# Patient Record
Sex: Male | Born: 1975 | Race: Black or African American | Hispanic: No | Marital: Married | Smoking: Never smoker
Health system: Southern US, Community
[De-identification: ages and names within clinical notes are randomized; demographics above are authoritative.]

## PROBLEM LIST (undated history)

## (undated) DIAGNOSIS — R413 Other amnesia: Secondary | ICD-10-CM

## (undated) DIAGNOSIS — I10 Essential (primary) hypertension: Secondary | ICD-10-CM

## (undated) DIAGNOSIS — Q039 Congenital hydrocephalus, unspecified: Secondary | ICD-10-CM

## (undated) DIAGNOSIS — J45909 Unspecified asthma, uncomplicated: Secondary | ICD-10-CM

## (undated) DIAGNOSIS — F32A Depression, unspecified: Secondary | ICD-10-CM

## (undated) DIAGNOSIS — F419 Anxiety disorder, unspecified: Secondary | ICD-10-CM

## (undated) DIAGNOSIS — T7840XA Allergy, unspecified, initial encounter: Secondary | ICD-10-CM

## (undated) DIAGNOSIS — M5430 Sciatica, unspecified side: Secondary | ICD-10-CM

## (undated) DIAGNOSIS — M1990 Unspecified osteoarthritis, unspecified site: Secondary | ICD-10-CM

## (undated) HISTORY — DX: Sciatica, unspecified side: M54.30

## (undated) HISTORY — DX: Anxiety disorder, unspecified: F41.9

## (undated) HISTORY — PX: TONSILLECTOMY: SUR1361

## (undated) HISTORY — DX: Depression, unspecified: F32.A

## (undated) HISTORY — DX: Other amnesia: R41.3

## (undated) HISTORY — DX: Unspecified asthma, uncomplicated: J45.909

## (undated) HISTORY — DX: Allergy, unspecified, initial encounter: T78.40XA

## (undated) HISTORY — DX: Congenital hydrocephalus, unspecified: Q03.9

## (undated) HISTORY — DX: Unspecified osteoarthritis, unspecified site: M19.90

## (undated) HISTORY — PX: BRAIN SURGERY: SHX531

---

## 2005-04-02 ENCOUNTER — Ambulatory Visit: Payer: Self-pay | Admitting: Internal Medicine

## 2005-07-05 ENCOUNTER — Ambulatory Visit: Payer: Self-pay | Admitting: Internal Medicine

## 2005-07-12 ENCOUNTER — Ambulatory Visit: Payer: Self-pay | Admitting: Internal Medicine

## 2005-08-02 ENCOUNTER — Ambulatory Visit: Payer: Self-pay | Admitting: Internal Medicine

## 2006-01-20 ENCOUNTER — Ambulatory Visit: Payer: Self-pay | Admitting: Internal Medicine

## 2006-02-07 ENCOUNTER — Ambulatory Visit: Payer: Self-pay | Admitting: Internal Medicine

## 2006-12-09 ENCOUNTER — Ambulatory Visit: Payer: Self-pay | Admitting: Internal Medicine

## 2007-02-03 ENCOUNTER — Ambulatory Visit: Payer: Self-pay | Admitting: Internal Medicine

## 2007-03-10 ENCOUNTER — Ambulatory Visit: Payer: Self-pay | Admitting: Internal Medicine

## 2007-04-17 ENCOUNTER — Ambulatory Visit: Payer: Self-pay | Admitting: Internal Medicine

## 2007-09-07 DIAGNOSIS — R519 Headache, unspecified: Secondary | ICD-10-CM | POA: Insufficient documentation

## 2007-09-07 DIAGNOSIS — E669 Obesity, unspecified: Secondary | ICD-10-CM | POA: Insufficient documentation

## 2007-09-07 DIAGNOSIS — R51 Headache: Secondary | ICD-10-CM | POA: Insufficient documentation

## 2007-09-07 DIAGNOSIS — Z982 Presence of cerebrospinal fluid drainage device: Secondary | ICD-10-CM | POA: Insufficient documentation

## 2007-09-07 DIAGNOSIS — I10 Essential (primary) hypertension: Secondary | ICD-10-CM | POA: Insufficient documentation

## 2007-09-07 DIAGNOSIS — J309 Allergic rhinitis, unspecified: Secondary | ICD-10-CM | POA: Insufficient documentation

## 2008-05-06 ENCOUNTER — Ambulatory Visit: Payer: Self-pay | Admitting: Internal Medicine

## 2008-05-06 LAB — CONVERTED CEMR LAB
AST: 28 units/L (ref 0–37)
Albumin: 4.5 g/dL (ref 3.5–5.2)
BUN: 13 mg/dL (ref 6–23)
Calcium: 9.7 mg/dL (ref 8.4–10.5)
Chloride: 102 meq/L (ref 96–112)
Potassium: 4.4 meq/L (ref 3.5–5.3)

## 2008-10-03 ENCOUNTER — Ambulatory Visit: Payer: Self-pay | Admitting: Internal Medicine

## 2008-10-13 ENCOUNTER — Ambulatory Visit: Payer: Self-pay | Admitting: Internal Medicine

## 2008-12-27 LAB — LAB REPORT - SCANNED: A1c: 6

## 2009-04-03 DIAGNOSIS — F411 Generalized anxiety disorder: Secondary | ICD-10-CM | POA: Insufficient documentation

## 2009-06-06 ENCOUNTER — Ambulatory Visit (HOSPITAL_COMMUNITY): Admission: RE | Admit: 2009-06-06 | Discharge: 2009-06-06 | Payer: Self-pay

## 2009-11-20 ENCOUNTER — Encounter: Admission: RE | Admit: 2009-11-20 | Discharge: 2009-11-20 | Payer: Self-pay | Admitting: Internal Medicine

## 2009-12-28 LAB — LAB REPORT - SCANNED: A1c: 6

## 2010-02-05 ENCOUNTER — Encounter: Admission: RE | Admit: 2010-02-05 | Discharge: 2010-02-05 | Payer: Self-pay | Admitting: Internal Medicine

## 2010-12-31 LAB — LAB REPORT - SCANNED: A1c: 5.5

## 2012-01-01 LAB — LAB REPORT - SCANNED: A1c: 5.6

## 2012-01-07 ENCOUNTER — Ambulatory Visit (HOSPITAL_COMMUNITY)
Admission: RE | Admit: 2012-01-07 | Discharge: 2012-01-07 | Disposition: A | Discharge status: Home / Self Care | Payer: Medicare Other | Source: Ambulatory Visit | Attending: Internal Medicine | Admitting: Internal Medicine

## 2012-01-07 DIAGNOSIS — R0989 Other specified symptoms and signs involving the circulatory and respiratory systems: Secondary | ICD-10-CM

## 2012-01-07 NOTE — Progress Notes (Signed)
*  PRELIMINARY RESULTS* Vascular Ultrasound Carotid Duplex (Doppler) has been completed.  Preliminary findings: Bilaterally no significant ICA stenosis with antegrade vertebral flow.  Farrel Demark RDMS 01/07/2012, 10:23 AM

## 2012-01-14 ENCOUNTER — Other Ambulatory Visit: Payer: Self-pay | Admitting: Internal Medicine

## 2012-01-14 DIAGNOSIS — R7989 Other specified abnormal findings of blood chemistry: Secondary | ICD-10-CM

## 2012-01-16 ENCOUNTER — Ambulatory Visit
Admission: RE | Admit: 2012-01-16 | Discharge: 2012-01-16 | Disposition: A | Discharge status: Home / Self Care | Payer: Medicare Other | Source: Ambulatory Visit | Attending: Internal Medicine | Admitting: Internal Medicine

## 2012-01-16 DIAGNOSIS — R7989 Other specified abnormal findings of blood chemistry: Secondary | ICD-10-CM

## 2012-01-16 LAB — HEPATITIS PANEL, ACUTE: HM Hepatitis Screen: NEGATIVE

## 2013-01-06 LAB — LAB REPORT - SCANNED: A1c: 5.9

## 2014-01-05 ENCOUNTER — Other Ambulatory Visit: Payer: Self-pay | Admitting: Internal Medicine

## 2014-01-05 DIAGNOSIS — R51 Headache: Principal | ICD-10-CM

## 2014-01-05 DIAGNOSIS — R519 Headache, unspecified: Secondary | ICD-10-CM

## 2014-01-06 LAB — LAB REPORT - SCANNED: A1c: 5.9

## 2014-01-21 ENCOUNTER — Ambulatory Visit
Admission: RE | Admit: 2014-01-21 | Discharge: 2014-01-21 | Disposition: A | Discharge status: Home / Self Care | Payer: Medicare Other | Source: Ambulatory Visit | Attending: Internal Medicine | Admitting: Internal Medicine

## 2014-01-21 DIAGNOSIS — R51 Headache: Principal | ICD-10-CM

## 2014-01-21 DIAGNOSIS — R519 Headache, unspecified: Secondary | ICD-10-CM

## 2015-01-11 LAB — LAB REPORT - SCANNED: A1c: 6.1

## 2015-02-28 ENCOUNTER — Other Ambulatory Visit: Payer: Self-pay | Admitting: Internal Medicine

## 2015-02-28 DIAGNOSIS — M5489 Other dorsalgia: Secondary | ICD-10-CM

## 2015-03-01 ENCOUNTER — Ambulatory Visit
Admission: RE | Admit: 2015-03-01 | Discharge: 2015-03-01 | Disposition: A | Discharge status: Home / Self Care | Payer: Medicare Other | Source: Ambulatory Visit | Attending: Internal Medicine | Admitting: Internal Medicine

## 2015-03-01 DIAGNOSIS — M5489 Other dorsalgia: Secondary | ICD-10-CM

## 2015-12-11 ENCOUNTER — Ambulatory Visit
Admission: RE | Admit: 2015-12-11 | Discharge: 2015-12-11 | Disposition: A | Discharge status: Home / Self Care | Payer: Medicare Other | Source: Ambulatory Visit | Attending: Internal Medicine | Admitting: Internal Medicine

## 2015-12-11 ENCOUNTER — Other Ambulatory Visit: Payer: Self-pay | Admitting: Internal Medicine

## 2015-12-11 DIAGNOSIS — R0789 Other chest pain: Secondary | ICD-10-CM

## 2016-01-15 LAB — LAB REPORT - SCANNED: A1c: 5.8

## 2016-07-09 ENCOUNTER — Encounter: Payer: Self-pay | Admitting: *Deleted

## 2016-07-10 NOTE — Progress Notes (Signed)
This encounter was created in error - please disregard.

## 2017-01-15 LAB — LAB REPORT - SCANNED
A1c: 5.4
EGFR: 92
PSA, Total: 0.5

## 2018-02-10 LAB — LAB REPORT - SCANNED
A1c: 5.8
EGFR: 84
PSA, Total: 0.6

## 2019-02-11 LAB — LAB REPORT - SCANNED
A1c: 5.8
EGFR: 83
PSA, Total: 0.5

## 2019-04-26 ENCOUNTER — Other Ambulatory Visit: Payer: Medicare Other

## 2019-04-26 ENCOUNTER — Other Ambulatory Visit: Payer: Self-pay

## 2019-04-26 DIAGNOSIS — Z20822 Contact with and (suspected) exposure to covid-19: Secondary | ICD-10-CM

## 2019-04-29 LAB — NOVEL CORONAVIRUS, NAA: SARS-CoV-2, NAA: NOT DETECTED

## 2019-10-20 ENCOUNTER — Ambulatory Visit: Payer: Medicare Other | Attending: Internal Medicine

## 2019-10-20 ENCOUNTER — Other Ambulatory Visit: Payer: Self-pay

## 2019-10-20 DIAGNOSIS — Z20822 Contact with and (suspected) exposure to covid-19: Secondary | ICD-10-CM

## 2019-10-21 LAB — NOVEL CORONAVIRUS, NAA: SARS-CoV-2, NAA: NOT DETECTED

## 2020-02-10 ENCOUNTER — Ambulatory Visit: Payer: Medicare Other | Attending: Internal Medicine

## 2020-02-10 DIAGNOSIS — Z23 Encounter for immunization: Secondary | ICD-10-CM

## 2020-02-10 NOTE — Progress Notes (Signed)
   Covid-19 Vaccination Clinic  Name:  Randall Meadows    MRN: [id number] DOB: 11-29-1975  02/10/2020  [name] was observed post Covid-19 immunization for 15 minutes without incident. He was provided with Vaccine Information Sheet and instruction to access the V-Safe system.   [name] was instructed to call 911 with any severe reactions post vaccine: [name] Kitchen Difficulty breathing  . Swelling of face and throat  . A fast heartbeat  . A bad rash all over body  . Dizziness and weakness   Immunizations Administered    Name Date Dose VIS Date Route   Pfizer COVID-19 Vaccine 02/10/2020 10:24 AM 0.3 mL 10/08/2019 Intramuscular   Manufacturer: [employer], [employer]   Lot: [id number]   NDC: [address]-8

## 2020-03-02 LAB — LAB REPORT - SCANNED
A1c: 5.9
EGFR: 87

## 2020-03-06 ENCOUNTER — Ambulatory Visit: Payer: Medicare Other | Attending: Internal Medicine

## 2020-03-06 DIAGNOSIS — Z23 Encounter for immunization: Secondary | ICD-10-CM

## 2020-03-06 NOTE — Progress Notes (Signed)
   Covid-19 Vaccination Clinic  Name:  [name]    MRN: [id number] DOB: 06/07/1976  03/06/2020  [name] was observed post Covid-19 immunization for 15 minutes without incident. He was provided with Vaccine Information Sheet and instruction to access the V-Safe system.   [name] was instructed to call 911 with any severe reactions post vaccine: [name] Kitchen Difficulty breathing  . Swelling of face and throat  . A fast heartbeat  . A bad rash all over body  . Dizziness and weakness   Immunizations Administered    Name Date Dose VIS Date Route   Pfizer COVID-19 Vaccine 03/06/2020 10:27 AM 0.3 mL 12/22/2018 Intramuscular   Manufacturer: [employer], [employer]   Lot: [id number]   NDC: [address]-7

## 2020-09-15 ENCOUNTER — Other Ambulatory Visit: Payer: Self-pay

## 2020-09-15 ENCOUNTER — Emergency Department (HOSPITAL_COMMUNITY): Payer: Medicare Other

## 2020-09-15 ENCOUNTER — Encounter (HOSPITAL_COMMUNITY): Payer: Self-pay

## 2020-09-15 ENCOUNTER — Emergency Department (HOSPITAL_COMMUNITY)
Admission: EM | Admit: 2020-09-15 | Discharge: 2020-09-15 | Disposition: A | Discharge status: Home / Self Care | Payer: Medicare Other | Attending: Emergency Medicine | Admitting: Emergency Medicine

## 2020-09-15 DIAGNOSIS — I10 Essential (primary) hypertension: Secondary | ICD-10-CM | POA: Insufficient documentation

## 2020-09-15 DIAGNOSIS — Z79899 Other long term (current) drug therapy: Secondary | ICD-10-CM | POA: Diagnosis not present

## 2020-09-15 DIAGNOSIS — R079 Chest pain, unspecified: Secondary | ICD-10-CM | POA: Insufficient documentation

## 2020-09-15 HISTORY — DX: Essential (primary) hypertension: I10

## 2020-09-15 LAB — COMPREHENSIVE METABOLIC PANEL
ALT: 26 U/L (ref 0–44)
AST: 22 U/L (ref 15–41)
Albumin: 3.9 g/dL (ref 3.5–5.0)
Alkaline Phosphatase: 73 U/L (ref 38–126)
Anion gap: 8 (ref 5–15)
BUN: 13 mg/dL (ref 6–20)
CO2: 29 mmol/L (ref 22–32)
Calcium: 9 mg/dL (ref 8.9–10.3)
Chloride: 99 mmol/L (ref 98–111)
Creatinine, Ser: 0.97 mg/dL (ref 0.61–1.24)
GFR, Estimated: >60 mL/min (ref >60)
Glucose, Bld: 91 mg/dL (ref 70–99)
Potassium: 3.3 mmol/L — ABNORMAL LOW (ref 3.5–5.1)
Sodium: 136 mmol/L (ref 135–145)
Total Bilirubin: 0.5 mg/dL (ref 0.3–1.2)
Total Protein: 7.6 g/dL (ref 6.5–8.1)

## 2020-09-15 LAB — CBC WITH DIFFERENTIAL/PLATELET
Abs Immature Granulocytes: 0.04 10*3/uL (ref 0.00–0.07)
Basophils Absolute: 0.1 10*3/uL (ref 0.0–0.1)
Basophils Relative: 1 %
Eosinophils Absolute: 0.1 10*3/uL (ref 0.0–0.5)
Eosinophils Relative: 1 %
HCT: 45.7 % (ref 39.0–52.0)
Hemoglobin: 15 g/dL (ref 13.0–17.0)
Immature Granulocytes: 0 %
Lymphocytes Relative: 39 %
Lymphs Abs: 3.5 10*3/uL (ref 0.7–4.0)
MCH: 28.9 pg (ref 26.0–34.0)
MCHC: 32.8 g/dL (ref 30.0–36.0)
MCV: 88.1 fL (ref 80.0–100.0)
Monocytes Absolute: 1.1 10*3/uL — ABNORMAL HIGH (ref 0.1–1.0)
Monocytes Relative: 12 %
Neutro Abs: 4.2 10*3/uL (ref 1.7–7.7)
Neutrophils Relative %: 47 %
Platelets: 263 10*3/uL (ref 150–400)
RBC: 5.19 MIL/uL (ref 4.22–5.81)
RDW: 13.9 % (ref 11.5–15.5)
WBC: 9 10*3/uL (ref 4.0–10.5)
nRBC: 0 % (ref 0.0–0.2)

## 2020-09-15 LAB — TROPONIN I (HIGH SENSITIVITY)
Troponin I (High Sensitivity): 2 ng/L (ref <18)
Troponin I (High Sensitivity): 2 ng/L (ref <18)

## 2020-09-15 LAB — D-DIMER, QUANTITATIVE: D-Dimer, Quant: 0.27 ug/mL-FEU (ref 0.00–0.50)

## 2020-09-15 NOTE — ED Provider Notes (Signed)
[facility] EMERGENCY DEPARTMENT Provider Note   CSN: [id number] Arrival date & time: 09/15/20  6270     History Chief Complaint  Patient presents with  . Chest Pain    Randall Meadows is a 44 y.o. male.  Intermittent chest pain for last couple weeks.  Has been seen for the same without resolution.  Never associated eating.  Not really associated to positions.  Not associated to exertion.  No other symptoms associated with it.  Patient without any cardiac issue that he knows of.        Past Medical History:  Diagnosis Date  . Hypertension     Patient Active Problem List   Diagnosis Date Noted  . ANXIETY 04/03/2009  . OBESITY 09/07/2007  . HYPERTENSION 09/07/2007  . ALLERGIC RHINITIS 09/07/2007  . HEADACHE 09/07/2007  . 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/2007    History reviewed. No pertinent surgical history.     No family history on file.  Social History   Tobacco Use  . Smoking status: Never Smoker  . Smokeless tobacco: Never Used  Substance Use Topics  . Alcohol use: Never  . Drug use: Never    Home Medications Prior to Admission medications   Medication Sig Start Date End Date Taking? Authorizing Provider  cyclobenzaprine (FLEXERIL) 10 MG tablet Take 10 mg by mouth 2 (two) times daily as needed for muscle spasms. 07/23/20  Yes [provider]  lisinopril-hydrochlorothiazide (ZESTORETIC) 20-25 MG tablet Take 1 tablet by mouth daily. 08/14/20  Yes [provider]  LORazepam (ATIVAN) 0.5 MG tablet Take 0.5 mg by mouth at bedtime as needed for anxiety.  08/01/20  Yes [provider]  sertraline (ZOLOFT) 50 MG tablet Take 50 mg by mouth daily. 07/23/20  Yes [provider]  traMADol (ULTRAM) 50 MG tablet Take 50 mg by mouth 2 (two) times daily as needed for moderate pain.  07/23/20  Yes [provider]    Allergies    Patient has no known allergies.  Review of Systems   Review of Systems    All other systems reviewed and are negative.   Physical Exam Updated Vital Signs BP (!) 148/89 (BP Location: Left Arm)   Pulse 77   Temp 97.8 F (36.6 C) (Oral)   Resp 18   Ht 5\' 9"  (1.753 m)   Wt 124.7 kg   SpO2 99%   BMI 40.61 kg/m   Physical Exam Vitals and nursing note reviewed.  Constitutional:      Appearance: He is well-developed.  HENT:     Head: Normocephalic and atraumatic.     Mouth/Throat:     Mouth: Mucous membranes are moist.  Eyes:     Pupils: Pupils are equal, round, and reactive to light.  Cardiovascular:     Rate and Rhythm: Normal rate.  Pulmonary:     Effort: Pulmonary effort is normal. No respiratory distress.  Abdominal:     General: There is no distension.     Palpations: Abdomen is soft.  Musculoskeletal:        General: Normal range of motion.     Cervical back: Normal range of motion.  Skin:    General: Skin is warm and dry.  Neurological:     General: No focal deficit present.     Mental Status: He is alert.     ED Results / Procedures / Treatments   Labs (all labs ordered are listed, but only abnormal results are displayed) Labs  Reviewed  CBC WITH DIFFERENTIAL/PLATELET - Abnormal; Notable for the following components:      Result Value   Monocytes Absolute 1.1 (*)    All other components within normal limits  COMPREHENSIVE METABOLIC PANEL - Abnormal; Notable for the following components:   Potassium 3.3 (*)    All other components within normal limits  D-DIMER, QUANTITATIVE (NOT AT [facility])  TROPONIN I (HIGH SENSITIVITY)  TROPONIN I (HIGH SENSITIVITY)    EKG EKG Interpretation  Date/Time:  Friday September 15 2020 03:49:55 EST Ventricular Rate:  86 PR Interval:    QRS Duration: 95 QT Interval:  359 QTC Calculation: 430 R Axis:   35 Text Interpretation: Sinus rhythm Baseline wander in lead(s) V5 No old tracing to compare Confirmed by Marily Memos [phone]) on 09/15/2020 3:54:12 AM   Radiology No results  found.  Procedures Procedures (including critical care time)  Medications Ordered in ED Medications - No data to display  ED Course  I have reviewed the triage vital signs and the nursing notes.  Pertinent labs & imaging results that were available during my care of the patient were reviewed by me and considered in my medical decision making (see chart for details).    MDM Rules/Calculators/A&P                          Work-up unremarkable.  Low likelihood of ACS.  The likelihood of pulmonary embolus.  No evidence of infection, pneumothorax or other acute causes for symptoms at this time.  Final Clinical Impression(s) / ED Diagnoses Final diagnoses:  Nonspecific chest pain    Rx / DC Orders ED Discharge Orders    None       Lamis Behrmann, Barbara Cower, MD 09/17/20 [phone]

## 2020-09-15 NOTE — Discharge Instructions (Addendum)
Follow-up with cardiology in the next few days.  The contact information for the cardiology clinic here in [place] has been provided in this discharge summary for you to call and make these arrangements.  Return to the emergency department in the meantime if symptoms significantly worsen or change.

## 2020-09-15 NOTE — ED Provider Notes (Signed)
Care assumed from Dr. Clayborne Dana at shift change.  Patient awaiting second troponin which has resulted as negative.  Patient to be discharged with follow-up with primary doctor.   Geoffery Lyons, MD 09/15/20 [phone]

## 2020-09-15 NOTE — ED Triage Notes (Signed)
Pt reports chest pain and headache for 3 weeks. Pt reports pain comes and goes. Pt reports taking baby ASA and sometimes it helps. Pt denies any other symptoms.

## 2020-09-18 ENCOUNTER — Other Ambulatory Visit: Payer: Self-pay

## 2020-09-18 ENCOUNTER — Encounter: Payer: Self-pay | Admitting: Cardiology

## 2020-09-18 ENCOUNTER — Ambulatory Visit (INDEPENDENT_AMBULATORY_CARE_PROVIDER_SITE_OTHER): Payer: Medicare Other | Admitting: Cardiology

## 2020-09-18 VITALS — BP 126/70 | HR 85 | Ht 69.75 in | Wt 278.0 lb

## 2020-09-18 DIAGNOSIS — R072 Precordial pain: Secondary | ICD-10-CM | POA: Diagnosis not present

## 2020-09-18 DIAGNOSIS — I10 Essential (primary) hypertension: Secondary | ICD-10-CM | POA: Diagnosis not present

## 2020-09-18 DIAGNOSIS — Z7189 Other specified counseling: Secondary | ICD-10-CM | POA: Diagnosis not present

## 2020-09-18 DIAGNOSIS — R079 Chest pain, unspecified: Secondary | ICD-10-CM | POA: Diagnosis not present

## 2020-09-18 MED ORDER — METOPROLOL TARTRATE 50 MG PO TABS: ORAL_TABLET | ORAL | 0 refills | Status: DC

## 2020-09-18 NOTE — Progress Notes (Signed)
Cardiology Office Note:    Date:  09/18/2020   ID:  Randall Meadows, DOB 08/13/1976, MRN [id number]  PCP:  Lorene Dy, MD  Cardiologist:  Buford Dresser, MD  Referring MD: Lorene Dy, MD   No chief complaint on file.   History of Present Illness:    Randall Meadows is a 44 y.o. male with a hx of hypertension who is seen as a new consult at the request of Lorene Dy, MD for the evaluation and management of chest pain.  ER note from 09/15/2020 reviewed. Seen at [facility] for intermittent chest pain for several weeks. Workup unremarkable. Referred to cardiology for further evaluation. hsTnI 2, 2. D-dimer 0.27. CMP notable for hypokalemia at 3.3 (previously normal). CBC largely unremarkable. He was referred back to his PCP Dr. Mancel Bale, but I do not have a copy of any documentation from Dr. Mancel Bale' office. He has not seen Dr. Mancel Bale since his ER visit.  Chest pain: -Initial onset: months of intermittent chest pain. Recent episode is most severe -Quality: sharp, central pain -Frequency/duration: constant for the last few weeks. -Associated symptoms: shortness of breath, can also have tingling in his left arm from shoulder to fingertips (none recent) -Aggravating/alleviating factors: nothing clearly makes it better or worse; can be worse with activity, lying flat, sitting up.  Worse when he presses on his chest. -Prior cardiac history: none -Prior ECG: NSR -Prior workup: none -Prior treatment: none -Alcohol: never  -Tobacco: never -Comorbidities: hypertension -Exercise level: stays busy. Was mowing lawns, now playing with kids, etc. Has no stairs.  -Cardiac ROS: + PND for several weeks, no orthopnea (chronically sleeps on 3 pillows), trivial intermittent left LE edema, + remote syncope in 2017. -Family history: Mother has heart issues, mat Gma has heart issues, mat great Gma also with heart issues.  Past Medical History:  Diagnosis Date  . Hypertension      No past surgical history on file.  Current Medications: Current Outpatient Medications on File Prior to Visit  Medication Sig  . cyclobenzaprine (FLEXERIL) 10 MG tablet Take 10 mg by mouth 2 (two) times daily as needed for muscle spasms.  [name] Kitchen lisinopril-hydrochlorothiazide (ZESTORETIC) 20-25 MG tablet Take 1 tablet by mouth daily.  [name] Kitchen LORazepam (ATIVAN) 0.5 MG tablet Take 0.5 mg by mouth at bedtime as needed for anxiety.   . sertraline (ZOLOFT) 50 MG tablet Take 50 mg by mouth daily.  . traMADol (ULTRAM) 50 MG tablet Take 50 mg by mouth 2 (two) times daily as needed for moderate pain.    No current facility-administered medications on file prior to visit.     Allergies:   Patient has no known allergies.   Social History   Tobacco Use  . Smoking status: Never Smoker  . Smokeless tobacco: Never Used  Substance Use Topics  . Alcohol use: Never  . Drug use: Never    Family History: Mother has heart issues, mat Gma has heart issues, mat great Gma also with heart issues.  ROS:   Please see the history of present illness.  Additional pertinent ROS: Constitutional: Negative for chills, fever, night sweats, unintentional weight loss  HENT: Negative for ear pain and hearing loss.   Eyes: Negative for loss of vision and eye pain.  Respiratory: Negative for cough, sputum, wheezing.   Cardiovascular: See HPI. Gastrointestinal: Negative for abdominal pain, melena, and hematochezia.  Genitourinary: Negative for dysuria and hematuria.  Musculoskeletal: Negative for falls and myalgias.  Skin: Negative for itching and rash.  Neurological: Negative for focal weakness, focal sensory changes and loss of consciousness.  Endo/Heme/Allergies: Does not bruise/bleed easily.     EKGs/Labs/Other Studies Reviewed:    The following studies were reviewed today: No prior cardiac studies  EKG:  EKG is personally reviewed.  The ekg ordered today demonstrates NSR at 85 bpm  Recent  Labs: 09/15/2020: ALT 26; BUN 13; Creatinine, Ser 0.97; Hemoglobin 15.0; Platelets 263; Potassium 3.3; Sodium 136  Recent Lipid Panel No results found for: CHOL, TRIG, HDL, CHOLHDL, VLDL, LDLCALC, LDLDIRECT  Physical Exam:    VS:  BP 126/70 (BP Location: Right Arm, Patient Position: Sitting, Cuff Size: Large)   Pulse 85   Ht 5' 9.75" (1.772 m)   Wt 278 lb (126.1 kg)   BMI 40.18 kg/m     Wt Readings from Last 3 Encounters:  09/15/20 275 lb (124.7 kg)    GEN: Well nourished, well developed in no acute distress HEENT: Normal, moist mucous membranes NECK: No JVD CARDIAC: regular rhythm, normal S1 and S2, no rubs or gallops. No murmurs. VASCULAR: Radial and DP pulses 2+ bilaterally. No carotid bruits RESPIRATORY:  Clear to auscultation without rales, wheezing or rhonchi  ABDOMEN: Soft, non-tender, non-distended MUSCULOSKELETAL:  Ambulates independently SKIN: Warm and dry, no edema NEUROLOGIC:  Alert and oriented x 3. No focal neuro deficits noted. PSYCHIATRIC:  Normal affect    ASSESSMENT:    1. Chest pain of uncertain etiology   2. Essential hypertension   3. Cardiac risk counseling   4. Counseling on health promotion and disease prevention   5. Precordial pain    PLAN:    Chest pain: -We spent significant time today reviewing potential causes of chest pain. We reviewed which tests I think are most appropriate given the symptoms, and we discussed risks/benefits and limitations of each of these tests. Please see summary below. We also discussed that if testing is unrevealing for a cardiac cause of the symptoms, there are many noncardiac causes as well that can contribute to symptoms. If the heart is ruled out, then I recommend returning to PCP to discuss alternative diagnoses. -discussed treadmill stress, nuclear stress/lexiscan, and CT coronary angiography. Discussed pros and cons of each, including but not limited to false positive/false negative risk, radiation risk, and risk  of IV contrast dye. Based on shared decision making, decision was made to pursue CT coronary angiography. -will give one time dose of metoprolol 2 hours prior to scheduled test -counseled on use of sublingual nitroglycerin and its importance to a good test -counseled on red flag warning signs that need immediate medical attention  Hypertension: -continue lisinopril-HCTZ  Cardiac risk counseling and prevention recommendations: -recommend heart healthy/Mediterranean diet, with whole grains, fruits, vegetable, fish, lean meats, nuts, and olive oil. Limit salt. -recommend moderate walking, 3-5 times/week for 30-50 minutes each session. Aim for at least 150 minutes.week. Goal should be pace of 3 miles/hours, or walking 1.5 miles in 30 minutes -recommend avoidance of tobacco products. Avoid excess alcohol. -ASCVD risk score: The ASCVD Risk score Mikey Bussing DC Jr., et al., 2013) failed to calculate for the following reasons:   Cannot find a previous HDL lab   Cannot find a previous total cholesterol lab    Plan for follow up: to be determined based on results of testing  Buford Dresser, MD, PhD [place]  [facility]    Medication Adjustments/Labs and Tests Ordered: Current medicines are reviewed at length with the patient today.  Concerns regarding medicines are outlined above.  Orders Placed This Encounter  Procedures  . CT CORONARY MORPH W/CTA COR W/SCORE W/CA W/CM &/OR WO/CM  . CT CORONARY FRACTIONAL FLOW RESERVE DATA PREP  . CT CORONARY FRACTIONAL FLOW RESERVE FLUID ANALYSIS  . EKG 12-Lead   Meds ordered this encounter  Medications  . metoprolol tartrate (LOPRESSOR) 50 MG tablet    Sig: TAKE 1 TABLET 2 HR PRIOR TO CARDIAC PROCEDURE    Dispense:  1 tablet    Refill:  0    Patient Instructions  Medication Instructions:  Your Physician recommend you continue on your current medication as directed.    *If you need a refill on your cardiac medications before your next  appointment, please call your pharmacy*   Lab Work: None   Testing/Procedures: Cardiac CT Angiography (CTA), is a special type of CT scan that uses a computer to produce multi-dimensional views of major blood vessels throughout the body. In CT angiography, a contrast material is injected through an IV to help visualize the blood vessels [facility]   Follow-Up: At [facility], you and your health needs are our priority.  As part of our continuing mission to provide you with exceptional heart care, we have created designated Provider Care Teams.  These Care Teams include your primary Cardiologist (physician) and Advanced Practice Providers (APPs -  Physician Assistants and Nurse Practitioners) who all work together to provide you with the care you need, when you need it.  We recommend signing up for the patient portal called "MyChart".  Sign up information is provided on this After Visit Summary.  MyChart is used to connect with patients for Virtual Visits (Telemedicine).  Patients are able to view lab/test results, encounter notes, upcoming appointments, etc.  Non-urgent messages can be sent to your provider as well.   To learn more about what you can do with MyChart, go to [email or web address].    Your next appointment:   As needed  The format for your next appointment:   In Person  Provider:   Buford Dresser, MD  Your cardiac CT will be scheduled at one of the below locations:   [facility] [address] [place], [place] [address] [phone]  If scheduled at [facility], please arrive at the [facility] main entrance of [facility] 30 minutes prior to test start time. Proceed to the [facility] Radiology Department (first floor) to check-in and test prep.  If scheduled at [facility], please arrive 15 mins early for check-in and test prep.  Please follow these instructions carefully (unless  otherwise directed):  Hold all erectile dysfunction medications at least 3 days (72 hrs) prior to test.  On the Night Before the Test: . Be sure to Drink plenty of water. . Do not consume any caffeinated/decaffeinated beverages or chocolate 12 hours prior to your test. . Do not take any antihistamines 12 hours prior to your test.   On the Day of the Test: . Drink plenty of water. Do not drink any water within one hour of the test. . Do not eat any food 4 hours prior to the test. . You may take your regular medications prior to the test.  . Take metoprolol (Lopressor) 50 mg two hours prior to test. . HOLD Lisinopril-Hydrochlorothiazide morning of the test.       After the Test: . Drink plenty of water. . After receiving IV contrast, you may experience a mild flushed feeling. This is normal. . On occasion,  you may experience a mild rash up to 24 hours after the test. This is not dangerous. If this occurs, you can take Benadryl 25 mg and increase your fluid intake. . If you experience trouble breathing, this can be serious. If it is severe call 911 IMMEDIATELY. If it is mild, please call our office. . If you take any of these medications: Glipizide/Metformin, Avandament, Glucavance, please do not take 48 hours after completing test unless otherwise instructed.   Once we have confirmed authorization from your insurance company, we will call you to set up a date and time for your test. Based on how quickly your insurance processes prior authorizations requests, please allow up to 4 weeks to be contacted for scheduling your Cardiac CT appointment. Be advised that routine Cardiac CT appointments could be scheduled as many as 8 weeks after your provider has ordered it.  For non-scheduling related questions, please contact the cardiac imaging nurse navigator should you have any questions/concerns: Marchia Bond, Cardiac Imaging Nurse Navigator Burley Saver, Interim Cardiac Imaging Nurse  [place] and Vascular Services Direct Office Dial: [phone]   For scheduling needs, including cancellations and rescheduling, please call [place], [phone] (temporary number).        Signed, Buford Dresser, MD PhD 09/18/2020     [place]

## 2020-09-18 NOTE — Patient Instructions (Addendum)
Medication Instructions:  Your Physician recommend you continue on your current medication as directed.    *If you need a refill on your cardiac medications before your next appointment, please call your pharmacy*   Lab Work: None   Testing/Procedures: Cardiac CT Angiography (CTA), is a special type of CT scan that uses a computer to produce multi-dimensional views of major blood vessels throughout the body. In CT angiography, a contrast material is injected through an IV to help visualize the blood vessels [facility]   Follow-Up: At [facility], you and your health needs are our priority.  As part of our continuing mission to provide you with exceptional heart care, we have created designated Provider Care Teams.  These Care Teams include your primary Cardiologist (physician) and Advanced Practice Providers (APPs -  Physician Assistants and Nurse Practitioners) who all work together to provide you with the care you need, when you need it.  We recommend signing up for the patient portal called "MyChart".  Sign up information is provided on this After Visit Summary.  MyChart is used to connect with patients for Virtual Visits (Telemedicine).  Patients are able to view lab/test results, encounter notes, upcoming appointments, etc.  Non-urgent messages can be sent to your provider as well.   To learn more about what you can do with MyChart, go to [email or web address].    Your next appointment:   As needed  The format for your next appointment:   In Person  Provider:   Buford Dresser, MD  Your cardiac CT will be scheduled at one of the below locations:   [facility] [address] [place], [place] [address] [phone]  If scheduled at [facility], please arrive at the [facility] main entrance of [facility] 30 minutes prior to test start time. Proceed to the [facility] Radiology Department (first floor) to check-in and test  prep.  If scheduled at [facility], please arrive 15 mins early for check-in and test prep.  Please follow these instructions carefully (unless otherwise directed):  Hold all erectile dysfunction medications at least 3 days (72 hrs) prior to test.  On the Night Before the Test: . Be sure to Drink plenty of water. . Do not consume any caffeinated/decaffeinated beverages or chocolate 12 hours prior to your test. . Do not take any antihistamines 12 hours prior to your test.   On the Day of the Test: . Drink plenty of water. Do not drink any water within one hour of the test. . Do not eat any food 4 hours prior to the test. . You may take your regular medications prior to the test.  . Take metoprolol (Lopressor) 50 mg two hours prior to test. . HOLD Lisinopril-Hydrochlorothiazide morning of the test.       After the Test: . Drink plenty of water. . After receiving IV contrast, you may experience a mild flushed feeling. This is normal. . On occasion, you may experience a mild rash up to 24 hours after the test. This is not dangerous. If this occurs, you can take Benadryl 25 mg and increase your fluid intake. . If you experience trouble breathing, this can be serious. If it is severe call 911 IMMEDIATELY. If it is mild, please call our office. . If you take any of these medications: Glipizide/Metformin, Avandament, Glucavance, please do not take 48 hours after completing test unless otherwise instructed.   Once we have confirmed authorization from your insurance  company, we will call you to set up a date and time for your test. Based on how quickly your insurance processes prior authorizations requests, please allow up to 4 weeks to be contacted for scheduling your Cardiac CT appointment. Be advised that routine Cardiac CT appointments could be scheduled as many as 8 weeks after your provider has ordered it.  For non-scheduling related questions, please contact the  cardiac imaging nurse navigator should you have any questions/concerns: Marchia Bond, Cardiac Imaging Nurse Navigator Burley Saver, Interim Cardiac Imaging Nurse [place] and Vascular Services Direct Office Dial: [phone]   For scheduling needs, including cancellations and rescheduling, please call [place], [phone] (temporary number).

## 2020-11-10 ENCOUNTER — Ambulatory Visit: Payer: Medicare Other | Admitting: Cardiology

## 2021-01-04 ENCOUNTER — Telehealth: Payer: Self-pay | Admitting: Cardiology

## 2021-01-04 NOTE — Telephone Encounter (Signed)
    Pt is calling to follow up the CT scan, he said Dr. Cristal Deer wants him to get that test but need to get his insurance approval, however, he has not heard anything about it and calling to follow up. No order on file for CT

## 2021-01-09 NOTE — Telephone Encounter (Signed)
Scheduler contact pt. Pt is now scheduled for Cardiac CTA on 3/21.

## 2021-01-09 NOTE — Telephone Encounter (Signed)
Spoke with pt's wife and made aware that nurse has reached out to he scheduling department to check status and see if test could be expedited. Wife verbalized understanding.

## 2021-01-11 ENCOUNTER — Telehealth (HOSPITAL_COMMUNITY): Payer: Self-pay | Admitting: Emergency Medicine

## 2021-01-11 NOTE — Telephone Encounter (Signed)
Reaching out to patient to offer assistance regarding upcoming cardiac imaging study; pt verbalizes understanding of appt date/time, parking situation and where to check in, pre-test NPO status and medications ordered, and verified current allergies; name and call back number provided for further questions should they arise Rockwell Alexandria RN Navigator Cardiac Imaging [facility] [phone] office [phone] cell  Holding lisinopril-HCTZ, taking 50mg  metoprolol tartrate 2 hr prior to scan 

## 2021-01-15 ENCOUNTER — Ambulatory Visit (HOSPITAL_COMMUNITY)
Admission: RE | Admit: 2021-01-15 | Discharge: 2021-01-15 | Disposition: A | Discharge status: Home / Self Care | Payer: Medicare Other | Source: Ambulatory Visit | Attending: Cardiology | Admitting: Cardiology

## 2021-01-15 ENCOUNTER — Other Ambulatory Visit: Payer: Self-pay

## 2021-01-15 DIAGNOSIS — Z006 Encounter for examination for normal comparison and control in clinical research program: Secondary | ICD-10-CM

## 2021-01-15 DIAGNOSIS — R072 Precordial pain: Secondary | ICD-10-CM | POA: Diagnosis present

## 2021-01-15 MED ORDER — NITROGLYCERIN 0.4 MG SL SUBL: 0.8000 mg | SUBLINGUAL_TABLET | Freq: Once | SUBLINGUAL | Status: AC

## 2021-01-15 MED ORDER — NITROGLYCERIN 0.4 MG SL SUBL
SUBLINGUAL_TABLET | SUBLINGUAL | Status: AC
  Administered 2021-01-15: 0.8 mg via SUBLINGUAL
  Filled 2021-01-15: qty 2

## 2021-01-15 MED ORDER — IOHEXOL 350 MG/ML SOLN
80.0000 mL | Freq: Once | INTRAVENOUS | Status: AC | PRN
  Administered 2021-01-15: 80 mL via INTRAVENOUS

## 2021-01-15 NOTE — Research (Signed)
IDENTIFY Informed Consent                  Subject Name: Randall Meadows   Subject met inclusion and exclusion criteria.  The informed consent form, study requirements and expectations were reviewed with the subject and questions and concerns were addressed prior to the signing of the consent form.  The subject verbalized understanding of the trial requirements.  The subject agreed to participate in the IDENTIFY trial and signed the informed consent at 11:23AM on 01/15/21.  The informed consent was obtained prior to performance of any protocol-specific procedures for the subject.  A copy of the signed informed consent was given to the subject and a copy was placed in the subject's medical record.   Meade Maw, Naval architect

## 2021-01-31 ENCOUNTER — Encounter (HOSPITAL_COMMUNITY): Payer: Self-pay

## 2021-01-31 ENCOUNTER — Other Ambulatory Visit: Payer: Self-pay

## 2021-01-31 ENCOUNTER — Telehealth: Payer: Self-pay | Admitting: Cardiology

## 2021-01-31 ENCOUNTER — Emergency Department (HOSPITAL_COMMUNITY): Payer: Medicare Other

## 2021-01-31 ENCOUNTER — Emergency Department (HOSPITAL_COMMUNITY)
Admission: EM | Admit: 2021-01-31 | Discharge: 2021-01-31 | Disposition: A | Discharge status: Home / Self Care | Payer: Medicare Other | Attending: Emergency Medicine | Admitting: Emergency Medicine

## 2021-01-31 DIAGNOSIS — Z79899 Other long term (current) drug therapy: Secondary | ICD-10-CM | POA: Diagnosis not present

## 2021-01-31 DIAGNOSIS — R0789 Other chest pain: Secondary | ICD-10-CM

## 2021-01-31 DIAGNOSIS — G44201 Tension-type headache, unspecified, intractable: Secondary | ICD-10-CM | POA: Diagnosis not present

## 2021-01-31 DIAGNOSIS — I10 Essential (primary) hypertension: Secondary | ICD-10-CM | POA: Insufficient documentation

## 2021-01-31 LAB — CBC WITH DIFFERENTIAL/PLATELET
Abs Immature Granulocytes: 0.02 10*3/uL (ref 0.00–0.07)
Basophils Absolute: 0.1 10*3/uL (ref 0.0–0.1)
Basophils Relative: 1 %
Eosinophils Absolute: 0.1 10*3/uL (ref 0.0–0.5)
Eosinophils Relative: 1 %
HCT: 48.3 % (ref 39.0–52.0)
Hemoglobin: 15.7 g/dL (ref 13.0–17.0)
Immature Granulocytes: 0 %
Lymphocytes Relative: 54 %
Lymphs Abs: 4.5 10*3/uL — ABNORMAL HIGH (ref 0.7–4.0)
MCH: 28.7 pg (ref 26.0–34.0)
MCHC: 32.5 g/dL (ref 30.0–36.0)
MCV: 88.3 fL (ref 80.0–100.0)
Monocytes Absolute: 0.7 10*3/uL (ref 0.1–1.0)
Monocytes Relative: 8 %
Neutro Abs: 3 10*3/uL (ref 1.7–7.7)
Neutrophils Relative %: 36 %
Platelets: 236 10*3/uL (ref 150–400)
RBC: 5.47 MIL/uL (ref 4.22–5.81)
RDW: 14.3 % (ref 11.5–15.5)
WBC: 8.4 10*3/uL (ref 4.0–10.5)
nRBC: 0 % (ref 0.0–0.2)

## 2021-01-31 LAB — COMPREHENSIVE METABOLIC PANEL
ALT: 27 U/L (ref 0–44)
AST: 22 U/L (ref 15–41)
Albumin: 4.2 g/dL (ref 3.5–5.0)
Alkaline Phosphatase: 80 U/L (ref 38–126)
Anion gap: 11 (ref 5–15)
BUN: 18 mg/dL (ref 6–20)
CO2: 26 mmol/L (ref 22–32)
Calcium: 9.2 mg/dL (ref 8.9–10.3)
Chloride: 99 mmol/L (ref 98–111)
Creatinine, Ser: 1.06 mg/dL (ref 0.61–1.24)
GFR, Estimated: >60 mL/min (ref >60)
Glucose, Bld: 103 mg/dL — ABNORMAL HIGH (ref 70–99)
Potassium: 3.6 mmol/L (ref 3.5–5.1)
Sodium: 136 mmol/L (ref 135–145)
Total Bilirubin: 0.8 mg/dL (ref 0.3–1.2)
Total Protein: 8.3 g/dL — ABNORMAL HIGH (ref 6.5–8.1)

## 2021-01-31 LAB — TROPONIN I (HIGH SENSITIVITY)
Troponin I (High Sensitivity): 2 ng/L (ref <18)
Troponin I (High Sensitivity): 6 ng/L (ref <18)

## 2021-01-31 MED ORDER — OXYCODONE-ACETAMINOPHEN 5-325 MG PO TABS: 1.0000 | ORAL_TABLET | Freq: Four times a day (QID) | ORAL | 0 refills | Status: DC | PRN

## 2021-01-31 MED ORDER — MORPHINE SULFATE (PF) 4 MG/ML IV SOLN
4.0000 mg | Freq: Once | INTRAVENOUS | Status: AC
Start: 2021-01-31 — End: 2021-01-31
  Administered 2021-01-31: 4 mg via INTRAVENOUS
  Filled 2021-01-31: qty 1

## 2021-01-31 NOTE — Telephone Encounter (Signed)
Received call into triage- regarding patient. He is having active chest pains- shortness of breath. Went to PCP and advised of chest pains, they gave him tramadol but this is not helping anything. Patient wife was advised to take him to the ED with active chest pains, as we needed blood work to evaluate and with cardiac history I would suggest he go. Patient wife will take him. Advised I would notify MD.   Patient wife verbalized understanding.

## 2021-01-31 NOTE — ED Triage Notes (Signed)
Pt to er room number 8, pt c/o chest pain for the past two weeks, states that he has also had some sob, states that he had a pe study at [facility] and it was negative, pt states that his pain comes and goes sporadically.

## 2021-01-31 NOTE — Discharge Instructions (Signed)
Contact your cardiologist next week for follow-up.  If your headache does not improve follow-up with your family doctor next week or if you have a neurologist he can see them

## 2021-01-31 NOTE — Telephone Encounter (Signed)
Agree with ER evaluation given active chest pain.

## 2021-01-31 NOTE — ED Provider Notes (Signed)
[facility] EMERGENCY DEPARTMENT Provider Note   CSN: [id number] Arrival date & time: 01/31/21  1134     History Chief Complaint  Patient presents with  . Chest Pain    Randall Meadows is a 45 y.o. male.  Patient complains of a headache as he had his CT scan of his chest and also he has been having chest pain.  His cardiac CT scan was normal  The history is provided by the patient and medical records. No language interpreter was used.  Chest Pain Pain location:  L chest Pain quality: not aching   Pain radiates to:  Does not radiate Pain severity:  Moderate Onset quality:  Sudden Timing:  Intermittent Chronicity:  New Context: not breathing   Associated symptoms: no abdominal pain, no back pain, no cough, no fatigue and no headache        Past Medical History:  Diagnosis Date  . Hypertension     Patient Active Problem List   Diagnosis Date Noted  . ANXIETY 04/03/2009  . OBESITY 09/07/2007  . HYPERTENSION 09/07/2007  . ALLERGIC RHINITIS 09/07/2007  . HEADACHE 09/07/2007  . 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/2007    History reviewed. No pertinent surgical history.     History reviewed. No pertinent family history.  Social History   Tobacco Use  . Smoking status: Never Smoker  . Smokeless tobacco: Never Used  Vaping Use  . Vaping Use: Never used  Substance Use Topics  . Alcohol use: Never  . Drug use: Never    Home Medications Prior to Admission medications   Medication Sig Start Date End Date Taking? Authorizing Provider  cyclobenzaprine (FLEXERIL) 10 MG tablet Take 10 mg by mouth 2 (two) times daily as needed for muscle spasms. 07/23/20  Yes [provider]  lisinopril-hydrochlorothiazide (ZESTORETIC) 20-25 MG tablet Take 1 tablet by mouth daily. 08/14/20  Yes [provider]  LORazepam (ATIVAN) 0.5 MG tablet Take 0.5 mg by mouth at bedtime as needed for anxiety.  08/01/20  Yes [provider]   sertraline (ZOLOFT) 50 MG tablet Take 50 mg by mouth daily. 07/23/20  Yes [provider]  traMADol (ULTRAM) 50 MG tablet Take 50 mg by mouth 2 (two) times daily as needed for moderate pain.  07/23/20  Yes [provider]  metoprolol tartrate (LOPRESSOR) 50 MG tablet TAKE 1 TABLET 2 HR PRIOR TO CARDIAC PROCEDURE Patient not taking: Reported on 01/31/2021 09/18/20   Jodelle Red, MD  oxyCODONE-acetaminophen (PERCOCET) 5-325 MG tablet Take 1 tablet by mouth every 6 (six) hours as needed. 01/31/21   Bethann Berkshire, MD    Allergies    Patient has no known allergies.  Review of Systems   Review of Systems  Constitutional: Negative for appetite change and fatigue.  HENT: Negative for congestion, ear discharge and sinus pressure.   Eyes: Negative for discharge.  Respiratory: Negative for cough.   Cardiovascular: Positive for chest pain.  Gastrointestinal: Negative for abdominal pain and diarrhea.  Genitourinary: Negative for frequency and hematuria.  Musculoskeletal: Negative for back pain.  Skin: Negative for rash.  Neurological: Negative for seizures and headaches.  Psychiatric/Behavioral: Negative for hallucinations.    Physical Exam Updated Vital Signs BP 113/78   Pulse 69   Temp 98.1 F (36.7 C) (Oral)   Resp 16   Ht 5' 9.75" (1.772 m)   Wt 124.7 kg   SpO2 99%   BMI 39.74 kg/m   Physical Exam Vitals and nursing note reviewed.  Constitutional:      Appearance: He is well-developed.  HENT:     Head: Normocephalic.     Mouth/Throat:     Mouth: Mucous membranes are moist.  Eyes:     General: No scleral icterus.    Conjunctiva/sclera: Conjunctivae normal.  Neck:     Thyroid: No thyromegaly.  Cardiovascular:     Rate and Rhythm: Normal rate and regular rhythm.     Heart sounds: No murmur heard. No friction rub. No gallop.   Pulmonary:     Breath sounds: No stridor. No wheezing or rales.  Chest:     Chest wall: No tenderness.  Abdominal:      General: There is no distension.     Tenderness: There is no abdominal tenderness. There is no rebound.  Musculoskeletal:        General: Normal range of motion.     Cervical back: Neck supple.  Lymphadenopathy:     Cervical: No cervical adenopathy.  Skin:    Findings: No erythema or rash.  Neurological:     Mental Status: He is alert and oriented to person, place, and time.     Motor: No abnormal muscle tone.     Coordination: Coordination normal.  Psychiatric:        Behavior: Behavior normal.     ED Results / Procedures / Treatments   Labs (all labs ordered are listed, but only abnormal results are displayed) Labs Reviewed  CBC WITH DIFFERENTIAL/PLATELET - Abnormal; Notable for the following components:      Result Value   Lymphs Abs 4.5 (*)    All other components within normal limits  COMPREHENSIVE METABOLIC PANEL - Abnormal; Notable for the following components:   Glucose, Bld 103 (*)    Total Protein 8.3 (*)    All other components within normal limits  TROPONIN I (HIGH SENSITIVITY)  TROPONIN I (HIGH SENSITIVITY)    EKG None  Radiology CT Head Wo Contrast  Result Date: 01/31/2021 CLINICAL DATA:  Cerebral hemorrhage suspected.  Worsening headaches. EXAM: CT HEAD WITHOUT CONTRAST TECHNIQUE: Contiguous axial images were obtained from the base of the skull through the vertex without intravenous contrast. COMPARISON:  Head CT January 21, 2014 FINDINGS: Brain: No evidence of acute infarction or hemorrhage. 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 Stable size of the ventricular system and extra-axial CSF spaces.  Vascular: No hyperdense vessel. Skull: Normal. Negative for fracture or focal lesion. Sinuses/Orbits: No acute finding. Other: None. IMPRESSION: 1. No acute intracranial pathology. 2. Stable appearance of congenital malformations as described above. 3. Stable size of the ventricular system and extra-axial CSF spaces. Electronically Signed   By: Baldemar Lenis M.D.   On: 01/31/2021 12:56   DG Chest Port 1 View  Result Date: 01/31/2021 CLINICAL DATA:  Chest pain. EXAM: PORTABLE CHEST 1 VIEW COMPARISON:  09/15/2020. FINDINGS: Shunt tubing again noted over the left chest. Mediastinum and hilar structures normal. Heart size normal. Mild left base subsegmental atelectasis and or scarring again noted. No acute infiltrate. No pleural effusion or pneumothorax. IMPRESSION: Mild left base pleural-parenchymal thickening consistent with scarring again noted. No acute cardiopulmonary disease. Electronically Signed   By: Maisie Fus  Register   On: 01/31/2021 12:48    Procedures Procedures   Medications Ordered in ED Medications  morphine 4 MG/ML injection 4 mg (4 mg Intravenous Given 01/31/21 1206)    ED Course  I have reviewed the triage vital signs and the nursing notes.  Pertinent labs & imaging results that were available during my care of the patient were reviewed by me and considered in my medical decision making (see chart for details).    MDM Rules/Calculators/A&P                          Patient's headache improved but still has a mild headache.  He will follow-up with family doctor and is given some Percocet.  His chest pain does not seem to be cardiac related and he will follow up with his cardiologist Final Clinical Impression(s) / ED Diagnoses Final diagnoses:  Atypical chest pain  Acute intractable tension-type headache    Rx / DC Orders ED Discharge Orders         Ordered    oxyCODONE-acetaminophen (PERCOCET) 5-325 MG tablet  Every 6 hours PRN,   Status:  Discontinued         01/31/21 1526    oxyCODONE-acetaminophen (PERCOCET) 5-325 MG tablet  Every 6 hours PRN        01/31/21 1527           Bethann Berkshire, MD 02/02/21 1421

## 2021-01-31 NOTE — Telephone Encounter (Signed)
Pt c/o of Chest Pain: STAT if CP now or developed within 24 hours  1. Are you having CP right now? Yes   2. Are you experiencing any other symptoms (ex. SOB, nausea, vomiting, sweating)? Headaches & SOB   3. How long have you been experiencing CP? Off and On since procedure performed on 01/15/21 has worsened over the weekend   4. Is your CP continuous or coming and going? Coming and going   5. Have you taken Nitroglycerin? No  Wife calling, requesting an appt in regards to this. Wife is with pt.  ?

## 2021-02-08 ENCOUNTER — Telehealth: Payer: Self-pay | Admitting: Cardiology

## 2021-02-08 NOTE — Telephone Encounter (Signed)
Spoke with pt's wife on the phone regarding pt having severe chest pain. Pt was seen in the ED last week however, all cardiac testing came back negative. Per cardiac CTA pt has no evidence of coronary artery disease and calcium score was zero. It was determined that pt's chest pain was non-cardiac related and was given percocet for the pain. Pt is still having severe chest pain and would like to be seen in the office. Asked if pt has been seen by his PCP. Wife states that pt is supposed to be seen by PCP next month for a physical. Wife states that the last time pt was seen by PCP was told that the pain is from inflammation of the chest wall and was given medication to help with inflammation. Pt's wife states that if this is the same issue the ED physician did not offer any medication to help with inflammation. Appointment made with Azalee Course, PA-C for 5/9 (first available). Pt states he will wait until then. No additional questions at this time, pt verbalizes understanding.

## 2021-02-08 NOTE — Telephone Encounter (Signed)
Pt c/o of Chest Pain: STAT if CP now or developed within 24 hours  1. Are you having CP right now?  Yes, per patient's wife  2. Are you experiencing any other symptoms (ex. SOB, nausea, vomiting, sweating)? Headache   3. How long have you been experiencing CP?  Patient was home from the ED about 1 week and then he started having chest pain again  4. Is your CP continuous or coming and going?  Coming and going   5. Have you taken Nitroglycerin?  No, she states he does not have any. ?

## 2021-02-08 NOTE — Telephone Encounter (Signed)
I called triage, but I did not get an answer. Please return call to patient when able.

## 2021-02-15 ENCOUNTER — Encounter (HOSPITAL_COMMUNITY): Payer: Self-pay | Admitting: *Deleted

## 2021-02-15 ENCOUNTER — Emergency Department (HOSPITAL_COMMUNITY)
Admission: EM | Admit: 2021-02-15 | Discharge: 2021-02-15 | Disposition: A | Discharge status: Home / Self Care | Payer: Medicare Other | Attending: Emergency Medicine | Admitting: Emergency Medicine

## 2021-02-15 ENCOUNTER — Emergency Department (HOSPITAL_COMMUNITY): Payer: Medicare Other

## 2021-02-15 ENCOUNTER — Other Ambulatory Visit: Payer: Self-pay

## 2021-02-15 DIAGNOSIS — Z79899 Other long term (current) drug therapy: Secondary | ICD-10-CM | POA: Insufficient documentation

## 2021-02-15 DIAGNOSIS — R079 Chest pain, unspecified: Secondary | ICD-10-CM | POA: Insufficient documentation

## 2021-02-15 DIAGNOSIS — I10 Essential (primary) hypertension: Secondary | ICD-10-CM | POA: Insufficient documentation

## 2021-02-15 LAB — CBC WITH DIFFERENTIAL/PLATELET
Abs Immature Granulocytes: 0.02 10*3/uL (ref 0.00–0.07)
Basophils Absolute: 0.1 10*3/uL (ref 0.0–0.1)
Basophils Relative: 1 %
Eosinophils Absolute: 0.1 10*3/uL (ref 0.0–0.5)
Eosinophils Relative: 1 %
HCT: 48.5 % (ref 39.0–52.0)
Hemoglobin: 15.8 g/dL (ref 13.0–17.0)
Immature Granulocytes: 0 %
Lymphocytes Relative: 41 %
Lymphs Abs: 3.1 10*3/uL (ref 0.7–4.0)
MCH: 28.8 pg (ref 26.0–34.0)
MCHC: 32.6 g/dL (ref 30.0–36.0)
MCV: 88.3 fL (ref 80.0–100.0)
Monocytes Absolute: 0.6 10*3/uL (ref 0.1–1.0)
Monocytes Relative: 7 %
Neutro Abs: 3.8 10*3/uL (ref 1.7–7.7)
Neutrophils Relative %: 50 %
Platelets: 270 10*3/uL (ref 150–400)
RBC: 5.49 MIL/uL (ref 4.22–5.81)
RDW: 14 % (ref 11.5–15.5)
WBC: 7.6 10*3/uL (ref 4.0–10.5)
nRBC: 0 % (ref 0.0–0.2)

## 2021-02-15 LAB — BASIC METABOLIC PANEL
Anion gap: 8 (ref 5–15)
BUN: 15 mg/dL (ref 6–20)
CO2: 27 mmol/L (ref 22–32)
Calcium: 9.3 mg/dL (ref 8.9–10.3)
Chloride: 101 mmol/L (ref 98–111)
Creatinine, Ser: 1.07 mg/dL (ref 0.61–1.24)
GFR, Estimated: >60 mL/min (ref >60)
Glucose, Bld: 95 mg/dL (ref 70–99)
Potassium: 4 mmol/L (ref 3.5–5.1)
Sodium: 136 mmol/L (ref 135–145)

## 2021-02-15 LAB — TROPONIN I (HIGH SENSITIVITY): Troponin I (High Sensitivity): <2 ng/L (ref <18)

## 2021-02-15 NOTE — ED Provider Notes (Addendum)
Care transferred to me.  Labs are unremarkable including negative troponin.  This chest pain has been present for weeks and so I do not think a delta is needed.  He had a negative coronary CTA a few weeks ago.  I have low suspicion for PE, dissection. Follow-up with cardiology.      Pricilla Loveless, MD 02/15/21 [phone]

## 2021-02-15 NOTE — Discharge Instructions (Signed)
If you develop recurrent, continued, or worsening chest pain, shortness of breath, fever, vomiting, abdominal or back pain, or any other new/concerning symptoms then return to the ER for evaluation.  

## 2021-02-15 NOTE — ED Provider Notes (Signed)
[facility] EMERGENCY DEPARTMENT Provider Note   CSN: [id number] Arrival date & time: 02/15/21  1214     History Chief Complaint  Patient presents with  . Chest Pain    Randall Meadows is a 45 y.o. male.  HPI He presents for evaluation of ongoing chest pain, daily, for 2 weeks.  Prior to that the pain has been present for at least 5 months.  The pain was there last night when he went to bed and was there this morning when he woke up.  It did not keep him awake during the night.  He has had multiple prior evaluations for this including by PCP, emergency department, and cardiology.  He reportedly had a CT scan of his chest, angiogram, which was negative.  He is taking oxycodone and tramadol for the pain with partial relief but it recurs.  He denies cough, shortness of breath, fever, chills, weakness or dizziness.  Patient has had similar chest pain for several weeks, consulted with cardiology and had cardiology imaging, which was normal.  Last week his wife contacted the cardiology service regarding "inflammation," of his chest, and wondering if he needed a specific treatment such as antibiotics.  At that time it was determined that he would follow-up for further evaluation by cardiology on 03/05/2021.    Past Medical History:  Diagnosis Date  . Hypertension     Patient Active Problem List   Diagnosis Date Noted  . ANXIETY 04/03/2009  . OBESITY 09/07/2007  . HYPERTENSION 09/07/2007  . ALLERGIC RHINITIS 09/07/2007  . HEADACHE 09/07/2007  . 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/2007    History reviewed. No pertinent surgical history.     No family history on file.  Social History   Tobacco Use  . Smoking status: Never Smoker  . Smokeless tobacco: Never Used  Vaping Use  . Vaping Use: Never used  Substance Use Topics  . Alcohol use: Never  . Drug use: Never    Home Medications Prior to Admission medications   Medication Sig Start Date End Date Taking?  Authorizing Provider  cyclobenzaprine (FLEXERIL) 10 MG tablet Take 10 mg by mouth 2 (two) times daily as needed for muscle spasms. 07/23/20   [provider]  lisinopril-hydrochlorothiazide (ZESTORETIC) 20-25 MG tablet Take 1 tablet by mouth daily. 08/14/20   [provider]  LORazepam (ATIVAN) 0.5 MG tablet Take 0.5 mg by mouth at bedtime as needed for anxiety.  08/01/20   [provider]  metoprolol tartrate (LOPRESSOR) 50 MG tablet TAKE 1 TABLET 2 HR PRIOR TO CARDIAC PROCEDURE Patient not taking: Reported on 01/31/2021 09/18/20   Jodelle Red, MD  oxyCODONE-acetaminophen (PERCOCET) 5-325 MG tablet Take 1 tablet by mouth every 6 (six) hours as needed. 01/31/21   Bethann Berkshire, MD  sertraline (ZOLOFT) 50 MG tablet Take 50 mg by mouth daily. 07/23/20   [provider]  traMADol (ULTRAM) 50 MG tablet Take 50 mg by mouth 2 (two) times daily as needed for moderate pain.  07/23/20   [provider]    Allergies    Patient has no known allergies.  Review of Systems   Review of Systems  All other systems reviewed and are negative.   Physical Exam Updated Vital Signs BP (!) 165/100   Pulse 85   Temp 98.3 F (36.8 C)   Resp (!) 26   SpO2 100%   Physical Exam Vitals and nursing note reviewed.  Constitutional:      General: He  is not in acute distress.    Appearance: He is well-developed. He is obese. He is not ill-appearing, toxic-appearing or diaphoretic.  HENT:     Head: Normocephalic and atraumatic.     Right Ear: External ear normal.     Left Ear: External ear normal.  Eyes:     Conjunctiva/sclera: Conjunctivae normal.     Pupils: Pupils are equal, round, and reactive to light.  Neck:     Trachea: Phonation normal.  Cardiovascular:     Rate and Rhythm: Normal rate and regular rhythm.     Heart sounds: Normal heart sounds.  Pulmonary:     Effort: Pulmonary effort is normal.     Breath sounds: Normal breath sounds.  Chest:      Chest wall: Tenderness (Diffuse, mild) present.  Abdominal:     Palpations: Abdomen is soft.     Tenderness: There is no abdominal tenderness.  Musculoskeletal:        General: Normal range of motion.     Cervical back: Normal range of motion and neck supple.  Skin:    General: Skin is warm and dry.  Neurological:     Mental Status: He is alert and oriented to person, place, and time.     Cranial Nerves: No cranial nerve deficit.     Sensory: No sensory deficit.     Motor: No abnormal muscle tone.     Coordination: Coordination normal.  Psychiatric:        Mood and Affect: Mood normal.        Behavior: Behavior normal.        Thought Content: Thought content normal.        Judgment: Judgment normal.     ED Results / Procedures / Treatments   Labs (all labs ordered are listed, but only abnormal results are displayed) Labs Reviewed - No data to display  EKG EKG Interpretation  Date/Time:  Thursday February 15 2021 12:24:54 EDT Ventricular Rate:  90 PR Interval:  158 QRS Duration: 94 QT Interval:  370 QTC Calculation: 453 R Axis:   19 Text Interpretation: Sinus rhythm ST elev, probable normal early repol pattern since last tracing no significant change Confirmed by Mancel Bale [phone]) on 02/15/2021 12:57:35 PM   Radiology No results found.  Procedures Procedures   Medications Ordered in ED Medications - No data to display  ED Course  I have reviewed the triage vital signs and the nursing notes.  Pertinent labs & imaging results that were available during my care of the patient were reviewed by me and considered in my medical decision making (see chart for details).    MDM Rules/Calculators/A&P                           Patient Vitals for the past 24 hrs:  BP Temp Pulse Resp SpO2  02/15/21 1226 (!) 165/100 98.3 F (36.8 C) 85 (!) 26 100 %     Medical Decision Making:  This patient is presenting for evaluation of persistent chest pain, which does require  a range of treatment options, and is a complaint that involves a high risk of morbidity and mortality. The differential diagnoses include ACS, chest wall pain, nonspecific chest pain, noncardiac chest pain. I decided to review old records, and in summary middle-aged male presenting with recurrent symptoms, ongoing, despite multiple evaluations and treatments.  I obtain additional historical information from wife at bedside.  His wife is concerned  that he might have "inflammation and need antibiotics," which was previously done by his PCP for the same problem.  Clinical Laboratory Tests Ordered, included CBC, Metabolic panel and Troponin. Radiologic Tests Ordered, included chest x-ray.       Critical Interventions-further evaluation, laboratory testing, radiography  After These Interventions, the Patient was reevaluated and was found to require evaluation for nonspecific chest pain, ongoing for 2 weeks.  He has previously been comprehensively evaluated by cardiology and has close follow-up scheduled.  I have very low suspicion for acute cardio or pulmonary disorders  CRITICAL CARE-no Performed by: Mancel Bale  Nursing Notes Reviewed/ Care Coordinated Applicable Imaging Reviewed Interpretation of Laboratory Data incorporated into ED treatment  Plan-disposition by Dr. Criss Alvine, following completion of testing.    Final Clinical Impression(s) / ED Diagnoses Final diagnoses:  Nonspecific chest pain    Rx / DC Orders ED Discharge Orders    None       Mancel Bale, MD 02/16/21 [phone]

## 2021-02-15 NOTE — ED Triage Notes (Signed)
Chest pain for the past 2 days

## 2021-03-05 ENCOUNTER — Other Ambulatory Visit: Payer: Self-pay

## 2021-03-05 ENCOUNTER — Encounter: Payer: Self-pay | Admitting: Physician Assistant

## 2021-03-05 ENCOUNTER — Ambulatory Visit (INDEPENDENT_AMBULATORY_CARE_PROVIDER_SITE_OTHER): Payer: Medicare Other | Admitting: Physician Assistant

## 2021-03-05 VITALS — BP 140/90 | HR 71 | Ht 63.25 in | Wt 292.8 lb

## 2021-03-05 DIAGNOSIS — R079 Chest pain, unspecified: Secondary | ICD-10-CM

## 2021-03-05 DIAGNOSIS — R0602 Shortness of breath: Secondary | ICD-10-CM

## 2021-03-05 MED ORDER — METOPROLOL TARTRATE 25 MG PO TABS: 12.5000 mg | ORAL_TABLET | Freq: Two times a day (BID) | ORAL | 3 refills | Status: DC

## 2021-03-05 NOTE — Progress Notes (Signed)
Cardiology Office Note:    Date:  03/07/2021   ID:  Randall Meadows, DOB 07-Feb-1976, MRN [id number]  PCP:  Burton Apley, MD   [facility] Providers Cardiologist:  Jodelle Red, MD {  Referring MD: Burton Apley, MD   Chief Complaint  Patient presents with  . Follow-up    Seen for Dr. Jodelle Red    History of Present Illness:    Randall Meadows is a 45 y.o. male with a hx of hypertension and normal coronary arteries on previous coronary CT in March 2022.  Patient was previously seen at [facility] in early November 2021 with intermittent chest pain.  D-dimer was negative.  Troponin negative.  He was subsequently seen by Dr. Jodelle Red in the cardiology clinic on 09/18/2020.  Subsequent coronary CT obtained on 01/15/2021 showed coronary calcium score of 0, normal coronary arteries with codominant.  No acute pulmonary issue either.  Patient presents today for evaluation of intermittent chest pain and shortness of breath.  He says he felt very well on the day when he took the coronary CT after taking the metoprolol.  Since then, he has been having worsening dyspnea on exertion and intermittent chest pain.  His chest pain sounds atypical and it feels worse prior with palpation.  Shortness of breath occurs both at rest and with exertion.  Previous D-dimer was negative in November, therefore suspicion for DVT low.  I will order echocardiogram to make sure ejection fraction is normal and there are no structural heart issue.  His blood pressure is borderline elevated today, I will add metoprolol tartrate 12.5 mg twice daily to his medical regimen.   Past Medical History:  Diagnosis Date  . Hypertension     History reviewed. No pertinent surgical history.  Current Medications: Current Meds  Medication Sig  . cyclobenzaprine (FLEXERIL) 10 MG tablet Take 10 mg by mouth 2 (two) times daily as needed for muscle spasms.  [name] Kitchen  lisinopril-hydrochlorothiazide (ZESTORETIC) 20-25 MG tablet Take 1 tablet by mouth daily.  [name] Kitchen LORazepam (ATIVAN) 0.5 MG tablet Take 0.5 mg by mouth at bedtime as needed for anxiety.   . metoprolol tartrate (LOPRESSOR) 25 MG tablet Take 0.5 tablets (12.5 mg total) by mouth 2 (two) times daily.  . sertraline (ZOLOFT) 50 MG tablet Take 50 mg by mouth daily.  . traMADol (ULTRAM) 50 MG tablet Take 50 mg by mouth 2 (two) times daily as needed for moderate pain.      Allergies:   Patient has no known allergies.   Social History   Socioeconomic History  . Marital status: Married    Spouse name: Not on file  . Number of children: Not on file  . Years of education: Not on file  . Highest education level: Not on file  Occupational History  . Not on file  Tobacco Use  . Smoking status: Never Smoker  . Smokeless tobacco: Never Used  Vaping Use  . Vaping Use: Never used  Substance and Sexual Activity  . Alcohol use: Never  . Drug use: Never  . Sexual activity: Not on file  Other Topics Concern  . Not on file  Social History Narrative  . Not on file   Social Determinants of Health   Financial Resource Strain: Not on file  Food Insecurity: Not on file  Transportation Needs: Not on file  Physical Activity: Not on file  Stress: Not on file  Social Connections: Not on file     Family History:  The patient's family history is not on file.  ROS:   Please see the history of present illness.     All other systems reviewed and are negative.  EKGs/Labs/Other Studies Reviewed:    The following studies were reviewed today:  Coronary CT 01/15/2021 FINDINGS: Coronary calcium score: The patient's coronary artery calcium score is 0, which places the patient in the N/A percentile.  Coronary arteries: Normal coronary origins.  Co-dominance.  Right Coronary Artery: Normal caliber vessel, gives rise to PDA. No significant plaque or stenosis.  Left Main Coronary Artery: Normal caliber  vessel. No significant plaque or stenosis.  Left Anterior Descending Coronary Artery: Normal caliber vessel. No significant plaque or stenosis. Gives rise to diagonal branches.  Left Circumflex Artery: Normal caliber vessel. No significant plaque or stenosis. Gives rise to OM branches.  Aorta: Normal size, 25 mm at the mid ascending aorta (level of the PA bifurcation) measured double oblique. No calcifications. No dissection.  Aortic Valve: No calcifications. Trileaflet.  Other findings:  Normal pulmonary vein drainage into the left atrium.  Normal left atrial appendage without a thrombus.  Normal size of the pulmonary artery.  IMPRESSION: 1. No evidence of CAD, CADRADS = 0.  2. Coronary calcium score of 0.  3. Normal coronary origin with co-dominance.  EKG:  EKG is ordered today.  The ekg ordered today demonstrates normal sinus rhythm without significant ST-T wave changes  Recent Labs: 01/31/2021: ALT 27 02/15/2021: BUN 15; Creatinine, Ser 1.07; Hemoglobin 15.8; Platelets 270; Potassium 4.0; Sodium 136  Recent Lipid Panel No results found for: CHOL, TRIG, HDL, CHOLHDL, VLDL, LDLCALC, LDLDIRECT   Risk Assessment/Calculations:       Physical Exam:    VS:  BP 140/90   Pulse 71   Ht 5' 3.25" (1.607 m)   Wt 292 lb 12.8 oz (132.8 kg)   SpO2 99%   BMI 51.46 kg/m     Wt Readings from Last 3 Encounters:  03/05/21 292 lb 12.8 oz (132.8 kg)  01/31/21 275 lb (124.7 kg)  09/18/20 278 lb (126.1 kg)     GEN:  Well nourished, well developed in no acute distress HEENT: Normal NECK: No JVD; No carotid bruits LYMPHATICS: No lymphadenopathy CARDIAC: RRR, no murmurs, rubs, gallops RESPIRATORY:  Clear to auscultation without rales, wheezing or rhonchi  ABDOMEN: Soft, non-tender, non-distended MUSCULOSKELETAL:  No edema; No deformity  SKIN: Warm and dry NEUROLOGIC:  Alert and oriented x 3 PSYCHIATRIC:  Normal affect   ASSESSMENT:    1. Chest pain of  uncertain etiology   2. SOB (shortness of breath)    PLAN:    In order of problems listed above:  1. Atypical chest pain: He continued to have intermittent chest discomfort recently, coronary CT shows no coronary artery disease, no further work-up is needed.  Blood pressure is mildly elevated on arrival, will add metoprolol 12.5 mg twice a day  2. Shortness of breath: He has been having worsening shortness of breath lately.  Obtain echocardiogram.        Medication Adjustments/Labs and Tests Ordered: Current medicines are reviewed at length with the patient today.  Concerns regarding medicines are outlined above.  Orders Placed This Encounter  Procedures  . EKG 12-Lead  . ECHOCARDIOGRAM COMPLETE   Meds ordered this encounter  Medications  . metoprolol tartrate (LOPRESSOR) 25 MG tablet    Sig: Take 0.5 tablets (12.5 mg total) by mouth 2 (two) times daily.    Dispense:  90 tablet  Refill:  3    Patient Instructions  Medication Instructions:   START Metoprolol Tartrate (Lopressor) 12.5 mg 2 times a day  *If you need a refill on your cardiac medications before your next appointment, please call your pharmacy*  Lab Work: NONE ordered at this time of appointment   If you have labs (blood work) drawn today and your tests are completely normal, you will receive your results only by: [name] Kitchen MyChart Message (if you have MyChart) OR . A paper copy in the mail If you have any lab test that is abnormal or we need to change your treatment, we will call you to review the results.  Testing/Procedures: Your physician has requested that you have an echocardiogram. Echocardiography is a painless test that uses sound waves to create images of your heart. It provides your doctor with information about the size and shape of your heart and how well your heart's chambers and valves are working. This procedure takes approximately one hour. There are no restrictions for this procedure.   Please  schedule for 3-4 weeks   Follow-Up: At [facility], you and your health needs are our priority.  As part of our continuing mission to provide you with exceptional heart care, we have created designated Provider Care Teams.  These Care Teams include your primary Cardiologist (physician) and Advanced Practice Providers (APPs -  Physician Assistants and Nurse Practitioners) who all work together to provide you with the care you need, when you need it.  We recommend signing up for the patient portal called "MyChart".  Sign up information is provided on this After Visit Summary.  MyChart is used to connect with patients for Virtual Visits (Telemedicine).  Patients are able to view lab/test results, encounter notes, upcoming appointments, etc.  Non-urgent messages can be sent to your provider as well.   To learn more about what you can do with MyChart, go to [email or web address].    Your next appointment:   3 month(s)  The format for your next appointment:   In Person  Provider:   Jodelle Red, MD   Other Instructions      Signed, Azalee Course, PA  03/07/2021 11:14 PM    [facility]

## 2021-03-05 NOTE — Patient Instructions (Signed)
Medication Instructions:   START Metoprolol Tartrate (Lopressor) 12.5 mg 2 times a day  *If you need a refill on your cardiac medications before your next appointment, please call your pharmacy*  Lab Work: NONE ordered at this time of appointment   If you have labs (blood work) drawn today and your tests are completely normal, you will receive your results only by: [name] Kitchen MyChart Message (if you have MyChart) OR . A paper copy in the mail If you have any lab test that is abnormal or we need to change your treatment, we will call you to review the results.  Testing/Procedures: Your physician has requested that you have an echocardiogram. Echocardiography is a painless test that uses sound waves to create images of your heart. It provides your doctor with information about the size and shape of your heart and how well your heart's chambers and valves are working. This procedure takes approximately one hour. There are no restrictions for this procedure.   Please schedule for 3-4 weeks   Follow-Up: At [facility], you and your health needs are our priority.  As part of our continuing mission to provide you with exceptional heart care, we have created designated Provider Care Teams.  These Care Teams include your primary Cardiologist (physician) and Advanced Practice Providers (APPs -  Physician Assistants and Nurse Practitioners) who all work together to provide you with the care you need, when you need it.  We recommend signing up for the patient portal called "MyChart".  Sign up information is provided on this After Visit Summary.  MyChart is used to connect with patients for Virtual Visits (Telemedicine).  Patients are able to view lab/test results, encounter notes, upcoming appointments, etc.  Non-urgent messages can be sent to your provider as well.   To learn more about what you can do with MyChart, go to [email or web address].    Your next appointment:   3 month(s)  The format for  your next appointment:   In Person  Provider:   Jodelle Red, MD   Other Instructions

## 2021-03-07 ENCOUNTER — Encounter: Payer: Self-pay | Admitting: Physician Assistant

## 2021-03-08 LAB — LAB REPORT - SCANNED
A1c: 5.9
EGFR: 94

## 2021-03-30 ENCOUNTER — Encounter (HOSPITAL_COMMUNITY): Payer: Self-pay | Admitting: *Deleted

## 2021-03-30 ENCOUNTER — Emergency Department (HOSPITAL_COMMUNITY): Payer: Medicare Other

## 2021-03-30 ENCOUNTER — Other Ambulatory Visit: Payer: Self-pay

## 2021-03-30 ENCOUNTER — Emergency Department (HOSPITAL_COMMUNITY)
Admission: EM | Admit: 2021-03-30 | Discharge: 2021-03-31 | Disposition: A | Discharge status: Home / Self Care | Payer: Medicare Other | Attending: Emergency Medicine | Admitting: Emergency Medicine

## 2021-03-30 DIAGNOSIS — Z79899 Other long term (current) drug therapy: Secondary | ICD-10-CM | POA: Diagnosis not present

## 2021-03-30 DIAGNOSIS — U071 COVID-19: Secondary | ICD-10-CM | POA: Diagnosis not present

## 2021-03-30 DIAGNOSIS — R0602 Shortness of breath: Secondary | ICD-10-CM | POA: Diagnosis present

## 2021-03-30 DIAGNOSIS — I10 Essential (primary) hypertension: Secondary | ICD-10-CM | POA: Diagnosis not present

## 2021-03-30 MED ORDER — DEXAMETHASONE 4 MG PO TABS
6.0000 mg | ORAL_TABLET | Freq: Once | ORAL | Status: AC
  Administered 2021-03-30: 6 mg via ORAL
  Filled 2021-03-30: qty 2

## 2021-03-30 MED ORDER — ALBUTEROL SULFATE HFA 108 (90 BASE) MCG/ACT IN AERS
2.0000 | INHALATION_SPRAY | RESPIRATORY_TRACT | Status: DC | PRN
  Administered 2021-03-30: 2 via RESPIRATORY_TRACT
  Filled 2021-03-30: qty 6.7

## 2021-03-30 MED ORDER — DEXAMETHASONE 6 MG PO TABS: 6.0000 mg | ORAL_TABLET | Freq: Every day | ORAL | 0 refills | Status: AC

## 2021-03-30 NOTE — ED Notes (Signed)
Patient ambulated 02 sat 98-100

## 2021-03-30 NOTE — ED Triage Notes (Signed)
DIAGNOSED COVID POSITIVE A WEEK AGO, C/O SHORTNESS OF BREATH WITH CHEST TIGHTNESS

## 2021-03-30 NOTE — ED Provider Notes (Addendum)
[facility] EMERGENCY DEPARTMENT Provider Note   CSN: [id number] Arrival date & time: 03/30/21  1714     History Chief Complaint  Patient presents with  . Shortness of Breath    Randall Meadows is a 45 y.o. male with a history of hypertension and anxiety, chronic chest pain also under the care of Dr. Cristal Deer, presenting for evaluation of symptoms persistent since he was diagnosed with COVID-19 on May 26.  He describes persistent nonproductive cough along with a mid sternal chest pressure which is worsened with coughing episodes.  He was given a steroid injection on May 26 which states helped him feel better for a while.  He denies significant shortness of breath except with prolonged exertion.  He endorses generalized fatigue.  He has had no fevers or chills, no nausea or vomiting, otherwise feels well.  Since his diagnosis 2 of his children have also tested positive for COVID but remained asymptomatic.   HPI     Past Medical History:  Diagnosis Date  . Hypertension     Patient Active Problem List   Diagnosis Date Noted  . ANXIETY 04/03/2009  . OBESITY 09/07/2007  . HYPERTENSION 09/07/2007  . ALLERGIC RHINITIS 09/07/2007  . HEADACHE 09/07/2007  . 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/2007    History reviewed. No pertinent surgical history.     No family history on file.  Social History   Tobacco Use  . Smoking status: Never Smoker  . Smokeless tobacco: Never Used  Vaping Use  . Vaping Use: Never used  Substance Use Topics  . Alcohol use: Never  . Drug use: Never    Home Medications Prior to Admission medications   Medication Sig Start Date End Date Taking? Authorizing Provider  cyclobenzaprine (FLEXERIL) 10 MG tablet Take 10 mg by mouth 2 (two) times daily as needed for muscle spasms. 07/23/20  Yes [provider]  lisinopril-hydrochlorothiazide (ZESTORETIC) 20-25 MG tablet Take 1 tablet by mouth daily. 08/14/20  Yes [provider]  metoprolol tartrate (LOPRESSOR) 25 MG tablet Take 0.5 tablets (12.5 mg total) by mouth 2 (two) times daily. 03/05/21  Yes Azalee Course, PA  sertraline (ZOLOFT) 50 MG tablet Take 50 mg by mouth daily. 07/23/20  Yes [provider]  traMADol (ULTRAM) 50 MG tablet Take 50 mg by mouth 2 (two) times daily as needed for moderate pain.  07/23/20  Yes [provider]  dexamethasone (DECADRON) 6 MG tablet Take 1 tablet (6 mg total) by mouth daily for 6 days. 03/30/21 04/05/21 Yes Daisia Slomski, Raynelle Fanning, PA-C  LORazepam (ATIVAN) 0.5 MG tablet Take 0.5 mg by mouth at bedtime as needed for anxiety.  Patient not taking: Reported on 03/30/2021 08/01/20   [provider]    Allergies    Patient has no known allergies.  Review of Systems   Review of Systems  Constitutional: Negative for fever.  HENT: Positive for congestion. Negative for sore throat.   Eyes: Negative.   Respiratory: Positive for cough, chest tightness and shortness of breath. Negative for wheezing.   Cardiovascular: Negative for chest pain.  Gastrointestinal: Negative for abdominal pain, nausea and vomiting.  Genitourinary: Negative.   Musculoskeletal: Negative for arthralgias, joint swelling and neck pain.  Skin: Negative.  Negative for rash and wound.  Neurological: Negative for dizziness, weakness, light-headedness, numbness and headaches.  Psychiatric/Behavioral: Negative.   All other systems reviewed and are negative.   Physical Exam Updated Vital Signs BP 140/84 (BP Location: Right Arm)  Pulse 62   Temp 98.5 F (36.9 C) (Oral)   Resp 16   SpO2 100%   Physical Exam Vitals and nursing note reviewed.  Constitutional:      Appearance: He is well-developed.  HENT:     Head: Normocephalic and atraumatic.  Eyes:     Conjunctiva/sclera: Conjunctivae normal.  Cardiovascular:     Rate and Rhythm: Normal rate and regular rhythm.     Heart sounds: Normal heart sounds.  Pulmonary:     Effort: Pulmonary  effort is normal.     Breath sounds: Normal breath sounds. No decreased breath sounds, wheezing, rhonchi or rales.  Chest:     Chest wall: Tenderness present.     Comments: Mild mid lower sternal tenderness to palpation. Abdominal:     General: Bowel sounds are normal.     Palpations: Abdomen is soft.     Tenderness: There is no abdominal tenderness.  Musculoskeletal:        General: Normal range of motion.     Cervical back: Normal range of motion.     Right lower leg: No edema.     Left lower leg: No edema.  Skin:    General: Skin is warm and dry.  Neurological:     Mental Status: He is alert.     ED Results / Procedures / Treatments   Labs (all labs ordered are listed, but only abnormal results are displayed) Labs Reviewed  SARS CORONAVIRUS 2 (TAT 6-24 HRS)    EKG EKG Interpretation  Date/Time:  Friday March 30 2021 17:36:32 EDT Ventricular Rate:  86 PR Interval:  148 QRS Duration: 94 QT Interval:  358 QTC Calculation: 428 R Axis:   46 Text Interpretation: Normal sinus rhythm Normal ECG since last tracing no significant change Confirmed by Mancel Bale [phone]) on 03/30/2021 11:43:43 PM   Radiology DG Chest 2 View  Result Date: 03/30/2021 CLINICAL DATA:  Shortness of breath.  Recent COVID-19 positive. EXAM: CHEST - 2 VIEW COMPARISON:  February 15, 2021 FINDINGS: Lungs are clear. Heart size and pulmonary vascularity are normal. No adenopathy. Shunt catheter extends along the medial left hemithorax anteriorly. No bone lesions. IMPRESSION: Shunt catheter on the left. Lungs clear. Cardiac silhouette normal. Electronically Signed   By: Bretta Bang III M.D.   On: 03/30/2021 17:55    Procedures Procedures   Medications Ordered in ED Medications  albuterol (VENTOLIN HFA) 108 (90 Base) MCG/ACT inhaler 2 puff (2 puffs Inhalation Given 03/30/21 2310)  dexamethasone (DECADRON) tablet 6 mg (6 mg Oral Given 03/30/21 2309)    ED Course  I have reviewed the triage vital signs  and the nursing notes.  Pertinent labs & imaging results that were available during my care of the patient were reviewed by me and considered in my medical decision making (see chart for details).    MDM Rules/Calculators/A&P                          Patient with persistent COVID-19 symptoms, however he is in no respiratory distress.  He has a normal chest x-ray.  He does have some midsternal soreness with palpation, suspect this is secondary to chest wall pain with frequency of cough.  He was placed on a course of Decadron and also given an albuterol MDI which may help with the chest tightness although he is not wheezing at this exam.  He has asked for a repeat COVID test to see if his  COVID has turned negative.  This is reasonable since his symptoms started 13 days ago.  This test is pending at this time.  He was ambulated in the department with no reduction in his oxygen saturation and was felt stable for discharge home.   Randall Meadows was evaluated in Emergency Department on 03/30/2021 for the symptoms described in the history of present illness. He was evaluated in the context of the global COVID-19 pandemic, which necessitated consideration that the patient might be at risk for infection with the SARS-CoV-2 virus that causes COVID-19. Institutional protocols and algorithms that pertain to the evaluation of patients at risk for COVID-19 are in a state of rapid change based on information released by regulatory bodies including the CDC and federal and state organizations. These policies and algorithms were followed during the patient's care in the ED.  Final Clinical Impression(s) / ED Diagnoses Final diagnoses:  COVID-19    Rx / DC Orders ED Discharge Orders         Ordered    dexamethasone (DECADRON) 6 MG tablet  Daily        03/30/21 2344           Burgess Amor, PA-C 03/30/21 2342    Burgess Amor, PA-C 03/30/21 2344    Mancel Bale, MD 03/31/21 1105

## 2021-03-30 NOTE — Discharge Instructions (Addendum)
Take your next dose of Decadron evening with your evening meal.  You may use the inhaler taking 2 puffs every 4 hours if you have any chest tightness or wheezing.  Rest make sure you are drinking plenty of fluids.  Your chest x-ray and exam are reassuring today.  Your COVID test should result by tomorrow.

## 2021-03-31 LAB — SARS CORONAVIRUS 2 (TAT 6-24 HRS): SARS Coronavirus 2: NEGATIVE

## 2021-04-04 ENCOUNTER — Other Ambulatory Visit (HOSPITAL_COMMUNITY): Payer: Medicare Other

## 2021-04-17 ENCOUNTER — Telehealth: Payer: Self-pay

## 2021-04-17 DIAGNOSIS — Z006 Encounter for examination for normal comparison and control in clinical research program: Secondary | ICD-10-CM

## 2021-04-17 NOTE — Telephone Encounter (Signed)
Called patient for 90 day Identify phone call, patient stated he is still having some of the same cardiac symptoms that brought him in and recently had COVID 19 dgx so his symptoms could be related to that; CP/SOB. He also stated that he has followed up with his doctor regarding these symptoms but no further diagnostic testing or procedures were necessary at the time. Lastly I informed him we would be calling him once more for a year follow up call regarding the study in March.

## 2021-04-25 ENCOUNTER — Other Ambulatory Visit: Payer: Self-pay

## 2021-04-25 ENCOUNTER — Ambulatory Visit (HOSPITAL_COMMUNITY): Payer: Medicare Other | Attending: Cardiovascular Disease

## 2021-04-25 DIAGNOSIS — R0602 Shortness of breath: Secondary | ICD-10-CM | POA: Diagnosis present

## 2021-04-25 LAB — ECHOCARDIOGRAM COMPLETE
Area-P 1/2: 3.42 cm2
S' Lateral: 3.1 cm

## 2021-04-26 NOTE — Progress Notes (Signed)
Normal pumping function of heart, no wall motion abnormality, no significant valve disease. Mild stiffening of heart muscle. Overall, an very reassuring result. I have personally called and spoke with Mr. Gladman result the result.

## 2021-06-06 ENCOUNTER — Other Ambulatory Visit: Payer: Self-pay

## 2021-06-06 ENCOUNTER — Ambulatory Visit (INDEPENDENT_AMBULATORY_CARE_PROVIDER_SITE_OTHER): Payer: Medicare Other | Admitting: Cardiology

## 2021-06-06 ENCOUNTER — Encounter (HOSPITAL_BASED_OUTPATIENT_CLINIC_OR_DEPARTMENT_OTHER): Payer: Self-pay | Admitting: Cardiology

## 2021-06-06 VITALS — BP 118/74 | HR 53 | Ht 69.0 in | Wt 291.4 lb

## 2021-06-06 DIAGNOSIS — Z8249 Family history of ischemic heart disease and other diseases of the circulatory system: Secondary | ICD-10-CM

## 2021-06-06 DIAGNOSIS — Z7182 Exercise counseling: Secondary | ICD-10-CM

## 2021-06-06 DIAGNOSIS — R079 Chest pain, unspecified: Secondary | ICD-10-CM | POA: Diagnosis not present

## 2021-06-06 DIAGNOSIS — Z713 Dietary counseling and surveillance: Secondary | ICD-10-CM

## 2021-06-06 DIAGNOSIS — Z712 Person consulting for explanation of examination or test findings: Secondary | ICD-10-CM | POA: Diagnosis not present

## 2021-06-06 DIAGNOSIS — Z7189 Other specified counseling: Secondary | ICD-10-CM

## 2021-06-06 DIAGNOSIS — I10 Essential (primary) hypertension: Secondary | ICD-10-CM

## 2021-06-06 NOTE — Patient Instructions (Signed)

## 2021-06-06 NOTE — Progress Notes (Signed)
Cardiology Office Note:    Date:  06/06/2021   ID:  Randall Meadows, DOB 1976-06-06, MRN [id number]  PCP:  Lorene Dy, MD  Cardiologist:  Buford Dresser, MD  Referring MD: Lorene Dy, MD   CC: follow up   History of Present Illness:    Randall Meadows is a 45 y.o. male with a hx of hypertension who is seen for follow-up. I initially met him 09/18/2020 as a new consult at the request of Lorene Dy, MD for the evaluation and management of chest pain.  CV history: intermittent chest pain >1 year. ER evaluation unremarkable. CT coronary with no calcium, no plaque. Echo normal with only grade 1 DD.  Today: He is accompanied by a family member. His main concern is that he continues to have constant chest pain/tightness and shortness of breath.  The episodes will occur at rest or activity, and are usually sudden without warning. 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  We reviewed his testing at length today, which is very reassuring from a cardiac perspective. Discussed non-cardiac causes of chest pain.  He endorses bilateral LE edema, primarily in his left LE. Usually occurs with a lot of walking or driving, also while he mowed lawns. Improves with elevated his legs.  While on flexeril (as needed), he sometimes notices improvement in his chest pain. He also notes that he has felt better since starting metoprolol. When he was given nitroglycerin previously, he felt a lot of pressure dissipating over his heart.  At one time he suffered from acid reflux. He denies feeling this recently, and typically avoids spicy foods. Monday 06/04/2021 he followed up with his PCP, who believes he may have issues concerning his stomach acid. However, he continues to follow a healthy diet and avoids triggers of acid reflux, and therefore does not agree. He also enjoys fish, and regularly eats fruits and vegetables (mostly frozen).   He  denies any palpitations. No lightheadedness, headaches, syncope, orthopnea, or PND.  Past Medical History:  Diagnosis Date   Hypertension     History reviewed. No pertinent surgical history.  Current Medications: Current Outpatient Medications on File Prior to Visit  Medication Sig   cyclobenzaprine (FLEXERIL) 10 MG tablet Take 10 mg by mouth 2 (two) times daily as needed for muscle spasms.   lisinopril-hydrochlorothiazide (ZESTORETIC) 20-25 MG tablet Take 1 tablet by mouth daily.   LORazepam (ATIVAN) 0.5 MG tablet Take 0.5 mg by mouth at bedtime as needed for anxiety.   metoprolol tartrate (LOPRESSOR) 25 MG tablet Take 0.5 tablets (12.5 mg total) by mouth 2 (two) times daily.   sertraline (ZOLOFT) 50 MG tablet Take 50 mg by mouth daily.   No current facility-administered medications on file prior to visit.     Allergies:   Patient has no known allergies.   Social History   Tobacco Use   Smoking status: Never   Smokeless tobacco: Never  Vaping Use   Vaping Use: Never used  Substance Use Topics   Alcohol use: Never   Drug use: Never    Family History: Mother has heart issues, mat Gma has heart issues, mat great Gma also with heart issues.  ROS:   Please see the history of present illness. (+) Chest pain (+) Shortness of breath (+) Bilateral LE edema (L>R) All other systems are reviewed and negative.   EKGs/Labs/Other Studies Reviewed:    The following studies were reviewed today:  Echo 04/25/2021:  1. Left ventricular ejection fraction, by estimation, is 65 to 70%. The  left ventricle has normal function. The left ventricle has no regional  wall motion abnormalities. Left ventricular diastolic parameters are  consistent with Grade I diastolic  dysfunction (impaired relaxation).   2. Right ventricular systolic function is normal. The right ventricular  size is normal. Tricuspid regurgitation signal is inadequate for assessing  PA pressure.   3. The mitral valve  is grossly normal. No evidence of mitral valve  regurgitation.   4. The aortic valve is tricuspid. Aortic valve regurgitation is not  visualized.   Comparison(s): No prior Echocardiogram.  CT Coronary 01/15/2021: IMPRESSION: 1. No evidence of CAD, CADRADS = 0. 2. Coronary calcium score of 0. 3. Normal coronary origin with co-dominance.  EKG:  EKG is personally reviewed.   06/06/2021: Sinus bradycardia at 53 bpm 09/18/2020: NSR at 85 bpm  Recent Labs: 01/31/2021: ALT 27 02/15/2021: BUN 15; Creatinine, Ser 1.07; Hemoglobin 15.8; Platelets 270; Potassium 4.0; Sodium 136  Recent Lipid Panel No results found for: CHOL, TRIG, HDL, CHOLHDL, VLDL, LDLCALC, LDLDIRECT  Physical Exam:    VS:  BP 118/74   Pulse (!) 53   Ht $R'5\' 9"'HD$  (1.753 m)   Wt 291 lb 6.4 oz (132.2 kg)   SpO2 97%   BMI 43.03 kg/m     Wt Readings from Last 3 Encounters:  06/06/21 291 lb 6.4 oz (132.2 kg)  03/05/21 292 lb 12.8 oz (132.8 kg)  01/31/21 275 lb (124.7 kg)    GEN: Well nourished, well developed in no acute distress HEENT: Normal, moist mucous membranes NECK: No JVD CARDIAC: regular rhythm, normal S1 and S2, no rubs or gallops. No murmurs. VASCULAR: Radial and DP pulses 2+ bilaterally. No carotid bruits RESPIRATORY:  Clear to auscultation without rales, wheezing or rhonchi  ABDOMEN: Soft, non-tender, non-distended MUSCULOSKELETAL:  Ambulates independently SKIN: Warm and dry, no edema NEUROLOGIC:  Alert and oriented x 3. No focal neuro deficits noted. PSYCHIATRIC:  Normal affect    ASSESSMENT:    1. Chest pain of uncertain etiology   2. Encounter to discuss test results   3. Nutritional counseling   4. Exercise counseling   5. Cardiac risk counseling   6. Counseling on health promotion and disease prevention   7. Family history of heart disease   8. Essential hypertension     PLAN:    Chest pain, noncardiac -we reviewed his cardiac workup at length, which is very reassuring. We also discussed  non-cardiac causes of chest pain. I recommend he continue to follow up with his PCP on this, but no further cardiac testing needed.  -counseled on red flag warning signs that need immediate medical attention  Hypertension: at goal -continue lisinopril-HCTZ  Family history of heart diease: Cardiac risk counseling and prevention recommendations: -recommend heart healthy/Mediterranean diet, with whole grains, fruits, vegetable, fish, lean meats, nuts, and olive oil. Limit salt. -recommend moderate walking, 3-5 times/week for 30-50 minutes each session. Aim for at least 150 minutes.week. Goal should be pace of 3 miles/hours, or walking 1.5 miles in 30 minutes -recommend avoidance of tobacco products. Avoid excess alcohol.  Plan for follow up: I would be happy to see him back as needed, but with his reassuring workup he does not need routine cardiology follow up  Buford Dresser, MD, PhD [place]  [facility]    Medication Adjustments/Labs and Tests Ordered: Current medicines are reviewed at length with the patient today.  Concerns regarding medicines are outlined  above.   Orders Placed This Encounter  Procedures   EKG 12-Lead    No orders of the defined types were placed in this encounter.  Patient Instructions  Medication Instructions:  Your Physician recommend you continue on your current medication as directed.    *If you need a refill on your cardiac medications before your next appointment, please call your pharmacy*   Lab Work: None ordered today   Testing/Procedures: None ordered today   Follow-Up: At [facility], you and your health needs are our priority.  As part of our continuing mission to provide you with exceptional heart care, we have created designated Provider Care Teams.  These Care Teams include your primary Cardiologist (physician) and Advanced Practice Providers (APPs -  Physician Assistants and Nurse Practitioners) who all work together to  provide you with the care you need, when you need it.  We recommend signing up for the patient portal called "MyChart".  Sign up information is provided on this After Visit Summary.  MyChart is used to connect with patients for Virtual Visits (Telemedicine).  Patients are able to view lab/test results, encounter notes, upcoming appointments, etc.  Non-urgent messages can be sent to your provider as well.   To learn more about what you can do with MyChart, go to [email or web address].    Your next appointment:   As needed  The format for your next appointment:   In Person  Provider:   Buford Dresser, MD      [facility],acting as a scribe for Buford Dresser, MD.,have documented all relevant documentation on the behalf of Buford Dresser, MD,as directed by  Buford Dresser, MD while in the presence of Buford Dresser, MD.  I, Buford Dresser, MD, have reviewed all documentation for this visit. The documentation on 06/06/21 for the exam, diagnosis, procedures, and orders are all accurate and complete.   Signed, Buford Dresser, MD PhD 06/06/2021     [place]

## 2021-07-18 ENCOUNTER — Ambulatory Visit: Payer: Medicare Other | Admitting: Allergy & Immunology

## 2021-10-10 ENCOUNTER — Ambulatory Visit (INDEPENDENT_AMBULATORY_CARE_PROVIDER_SITE_OTHER): Payer: Medicare Other | Admitting: Allergy & Immunology

## 2021-10-10 ENCOUNTER — Other Ambulatory Visit: Payer: Self-pay

## 2021-10-10 ENCOUNTER — Encounter: Payer: Self-pay | Admitting: Allergy & Immunology

## 2021-10-10 VITALS — BP 126/86 | HR 70 | Temp 98.7°F | Resp 18 | Ht 69.0 in | Wt 300.0 lb

## 2021-10-10 DIAGNOSIS — J301 Allergic rhinitis due to pollen: Secondary | ICD-10-CM

## 2021-10-10 DIAGNOSIS — J4520 Mild intermittent asthma, uncomplicated: Secondary | ICD-10-CM

## 2021-10-10 MED ORDER — ALBUTEROL SULFATE HFA 108 (90 BASE) MCG/ACT IN AERS: 1.0000 | INHALATION_SPRAY | RESPIRATORY_TRACT | 1 refills | Status: DC | PRN

## 2021-10-10 MED ORDER — FLUTICASONE PROPIONATE 50 MCG/ACT NA SUSP: 1.0000 | Freq: Every day | NASAL | 1 refills | Status: DC

## 2021-10-10 NOTE — Patient Instructions (Addendum)
1. Mild intermittent asthma, uncomplicated - Lung testing looked great today. - I do not think that you need an every day medication for your asthma. - Continue with albuterol as needed.  2. Seasonal allergic rhinitis due to pollen - Testing today showed: grasses, ragweed, weeds, and trees. - Copy of test results provided.  - Avoidance measures provided. - Start taking: Zyrtec (cetirizine) 10mg  tablet once daily and Flonase (fluticasone) one spray per nostril daily - You can use an extra dose of the antihistamine, if needed, for breakthrough symptoms.  - Consider nasal saline rinses 1-2 times daily to remove allergens from the nasal cavities as well as help with mucous clearance (this is especially helpful to do before the nasal sprays are given) - Consider allergy shots as a means of long-term control. - Allergy shots "re-train" and "reset" the immune system to ignore environmental allergens and decrease the resulting immune response to those allergens (sneezing, itchy watery eyes, runny nose, nasal congestion, etc).    - Allergy shots improve symptoms in 75-85% of patients.  - We can discuss more at the next appointment if the medications are not working for you.  3. Return in about 3 months (around 01/08/2022).    Please inform 01/10/2022 of any Emergency Department visits, hospitalizations, or changes in symptoms. Call us before going to the ED for breathing or allergy symptoms since we might be able to fit you in for a sick visit. Feel free to contact us anytime with any questions, problems, or concerns.  It was a pleasure to see you again today!  Websites that have reliable patient information: 1. American Academy of Asthma, Allergy, and Immunology: www.aaaai.org 2. Food Allergy Research and Education (FARE): foodallergy.org 3. Mothers of Asthmatics: http://www.asthmacommunitynetwork.org 4. American College of Allergy, Asthma, and Immunology: www.acaai.org   COVID-19 Vaccine Information  can be found at: [place] For questions related to vaccine distribution or appointments, please email [email or web address] or call [phone].   We realize that you might be concerned about having an allergic reaction to the COVID19 vaccines. To help with that concern, WE ARE OFFERING THE COVID19 VACCINES IN OUR OFFICE! Ask the front desk for dates!     Like [phone] on [place] and Instagram for our latest updates!      A healthy democracy works best when [employer] participate! Make sure you are registered to vote! If you have moved or changed any of your contact information, you will need to get this updated before voting!  In some cases, you MAY be able to register to vote online: [employer]     Airborne Adult Perc - 10/10/21 1024     Time Antigen Placed 1024    Allergen Manufacturer 10/12/21    Location Back    Number of Test 59    Panel 1 Select    1. Control-Buffer 50% Glycerol Negative    2. Control-Histamine 1 mg/ml 2+    3. Albumin saline Negative    4. Bahia 3+    5. Waynette Buttery 3+    6. Johnson Negative    7. Kentucky Blue Negative    8. Meadow Fescue Negative    9. Perennial Rye 3+    10. Sweet Vernal Negative    11. Timothy Negative    12. Cocklebur 2+    13. Burweed Marshelder 3+    14. Ragweed, short 3+    15. Ragweed, Giant 3+    16. Plantain,  English 3+    17. Lamb's Quarters 3+  18. Sheep Sorrell Negative    19. Rough Pigweed Negative    20. Marsh Elder, Rough Negative    21. Mugwort, Common 3+    22. Ash mix 3+    23. Birch mix Negative    24. Beech American Negative    25. Box, Elder 2+    26. Cedar, red 3+    27. Cottonwood, Guinea-Bissau Negative    28. Elm mix Negative    29. Hickory Negative    30. Maple mix Negative    31. Oak, Guinea-Bissau mix Negative    32. Pecan Pollen Negative    33. Pine mix Negative    34. Sycamore Eastern Negative     35. Walnut, Black Pollen Negative    36. Alternaria alternata Negative    37. Cladosporium Herbarum Negative    38. Aspergillus mix Negative    39. Penicillium mix Negative    40. Bipolaris sorokiniana (Helminthosporium) Negative    41. Drechslera spicifera (Curvularia) Negative    42. Mucor plumbeus Negative    43. Fusarium moniliforme Negative    44. Aureobasidium pullulans (pullulara) Negative    45. Rhizopus oryzae Negative    46. Botrytis cinera Negative    47. Epicoccum nigrum Negative    48. Phoma betae Negative    49. Candida Albicans Negative    50. Trichophyton mentagrophytes Negative    51. Mite, D Farinae  5,000 AU/ml Negative    52. Mite, D Pteronyssinus  5,000 AU/ml Negative    53. Cat Hair 10,000 BAU/ml Negative    54.  Dog Epithelia Negative    55. Mixed Feathers Negative    56. Horse Epithelia Negative    57. Cockroach, German Negative    58. Mouse Negative    59. Tobacco Leaf Negative             Reducing Pollen Exposure  The American Academy of Allergy, Asthma and Immunology suggests the following steps to reduce your exposure to pollen during allergy seasons.    Do not hang sheets or clothing out to dry; pollen may collect on these items. Do not mow lawns or spend time around freshly cut grass; mowing stirs up pollen. Keep windows closed at night.  Keep car windows closed while driving. Minimize morning activities outdoors, a time when pollen counts are usually at their highest. Stay indoors as much as possible when pollen counts or humidity is high and on windy days when pollen tends to remain in the air longer. Use air conditioning when possible.  Many air conditioners have filters that trap the pollen spores. Use a HEPA room air filter to remove pollen form the indoor air you breathe.

## 2021-10-10 NOTE — Progress Notes (Signed)
NEW PATIENT  Date of Service/Encounter:  10/10/21  Consult requested by: Randall Apley, MD   Assessment:   Mild intermittent asthma, uncomplicated  Seasonal allergic rhinitis due to pollen (grasses, ragweed, weeds, trees) - did not do intradermal testing since he is not interested in allergen immunotherapy  Negative cardiac work-up  Plan/Recommendations:   1. Mild intermittent asthma, uncomplicated - Lung testing looked great today. - I do not think that you need an every day medication for your asthma. - Continue with albuterol as needed.  2. Seasonal allergic rhinitis due to pollen - Testing today showed: grasses, ragweed, weeds, and trees. - Copy of test results provided.  - Avoidance measures provided. - Start taking: Zyrtec (cetirizine) 10mg  tablet once daily and Flonase (fluticasone) one spray per nostril daily - You can use an extra dose of the antihistamine, if needed, for breakthrough symptoms.  - Consider nasal saline rinses 1-2 times daily to remove allergens from the nasal cavities as well as help with mucous clearance (this is especially helpful to do before the nasal sprays are given) - Consider allergy shots as a means of long-term control. - Allergy shots "re-train" and "reset" the immune system to ignore environmental allergens and decrease the resulting immune response to those allergens (sneezing, itchy watery eyes, runny nose, nasal congestion, etc).    - Allergy shots improve symptoms in 75-85% of patients.  - We can discuss more at the next appointment if the medications are not working for you.  3. Return in about 3 months (around 01/08/2022).     This note in its entirety was forwarded to the Provider who requested this consultation.  Subjective:   Randall Meadows is a 45 y.o. male presenting today for evaluation of  Chief Complaint  Patient presents with   Allergy Testing    Patient states he has seasonal allergies. Head cold, sinus  infection.    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  13/07/2007 was referred by Randall Lindau, MD.     Randall Meadows is a 45 y.o. male presenting for an evaluation of allergies and asthma .    Asthma/Respiratory Symptom History: He was given albuterol inhaler in the spring 2022 after COVID. This is one of the main reason for his coming here.  He was on steroids for a period of time, around May 2022. He was not admitted to the hospital for the COVID19. He was having a lot of SOB and tightness. This has largely abated. The albuterol inhaler was helping. He was vaccinated already.  It does look like he has been worked up by cardiology for chest pain and shortness of breath.  He had a CT coronary with no calcium or plaque.  He also had an echo that was normal.  Cardiologist felt this was all reassuring from his perspective.  Allergic Rhinitis Symptom History: He has a history of spring allergies.  The remainder of the year is fine. If the weather changes, there is a problem. He had an issues around his birthday. He was diagnosed with COVID around that time. With the non step coughing, he was placed on steroids to help with the coughing and the SOB. Typically he  takes Allegra or Claritin. He was helping out with his family's yardwork. He would have problems with the cedar trees as well the grass and other trees. He never had an anaphylactic reaction. He never had to use his kids' EpiPens.   He has a history of hydrocephalus. 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 He has not had any recent revisions.  His two boys have allergies and are on allergy shots. They moved from [place], [place].  They moved to [place] initially and lived in an apartment with mold in the home. They moved here in 2005.   Otherwise, there is no history of other atopic diseases, including . There is no significant infectious history. Vaccinations are up to date.    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 If you have any questions, ask your nurse or doctor.          albuterol 108 (90 Base) MCG/ACT inhaler Commonly known as: VENTOLIN HFA Inhale 1-2 puffs into the lungs every 4 (four) hours as needed for wheezing or shortness of breath (cough, shortness of breath or wheezing.). Started by: Randall Spruce, MD   cyclobenzaprine 10 MG tablet Commonly known as: FLEXERIL Take 10 mg by mouth 2 (two) times daily as needed for muscle spasms.   fluticasone 50 MCG/ACT nasal spray Commonly known as: FLONASE Place 1 spray into both nostrils daily. Started by: Randall Spruce, MD   lisinopril-hydrochlorothiazide 20-25 MG tablet Commonly known as: ZESTORETIC Take 1 tablet by mouth daily.   LORazepam 0.5 MG tablet Commonly known as: ATIVAN Take 0.5 mg by mouth at bedtime as needed for anxiety.   metoprolol tartrate 25 MG tablet Commonly known as: LOPRESSOR Take 0.5 tablets (12.5 mg total) by mouth 2 (two) times daily.   sertraline 50 MG tablet Commonly known as: ZOLOFT Take 50 mg by mouth daily.        Birth History: non-contributory  Developmental History: non-contributory  Past Surgical History: Past Surgical History:  Procedure Laterality Date   TONSILLECTOMY       Family History: Family History  Problem  Relation Age of Onset   Urticaria Maternal Grandmother    Food Allergy Maternal Grandmother    Allergic rhinitis Son    Allergic rhinitis Son      Social History: Randall Meadows lives at home with his family.  There are no dogs or cats in the home.  He does have 2 children who live with him.  1 is in high school and what is in college.  He also has a primary care giver of his mother, who is 41 or so.  He is not a smoker.  He does not do drugs.  He currently works for his church.  He is about to finish a graduate degree.   Review of Systems  Constitutional: Negative.  Negative for fever, malaise/fatigue and weight loss.  HENT:  Positive for congestion. Negative for ear discharge and ear pain.        Positive for sneezing.  Eyes:  Negative for pain,  discharge and redness.  Respiratory:  Negative for cough, sputum production, shortness of breath and wheezing.   Cardiovascular: Negative.  Negative for chest pain and palpitations.  Gastrointestinal:  Negative for abdominal pain, constipation, diarrhea, heartburn, nausea and vomiting.  Skin: Negative.  Negative for itching and rash.  Neurological:  Negative for dizziness and headaches.  Endo/Heme/Allergies:  Positive for environmental allergies. Does not bruise/bleed easily.      Objective:   Blood pressure 126/86, pulse 70, temperature 98.7 F (37.1 C), temperature source Temporal, resp. rate 18, height 5\' 9"  (1.753 m), weight 300 lb (136.1 kg), SpO2 97 %. Body mass index is 44.3 kg/m.   Physical Exam:   Physical Exam Vitals reviewed.  Constitutional:      Appearance: He is well-developed.     Comments: Very talkative.   HENT:     Head: Normocephalic and atraumatic.     Right Ear: Tympanic membrane, ear canal and external ear normal. No drainage, swelling or tenderness. Tympanic membrane is not injected, scarred, erythematous, retracted or bulging.     Left Ear: Tympanic membrane, ear canal and external ear normal. No drainage,  swelling or tenderness. Tympanic membrane is not injected, scarred, erythematous, retracted or bulging.     Nose: No nasal deformity, septal deviation, mucosal edema or rhinorrhea.     Right Turbinates: Enlarged, swollen and pale.     Left Turbinates: Enlarged, swollen and pale.     Right Sinus: No maxillary sinus tenderness or frontal sinus tenderness.     Left Sinus: No maxillary sinus tenderness or frontal sinus tenderness.     Mouth/Throat:     Mouth: Mucous membranes are not pale and not dry.     Pharynx: Uvula midline.  Eyes:     General:        Right eye: No discharge.        Left eye: No discharge.     Conjunctiva/sclera: Conjunctivae normal.     Right eye: Right conjunctiva is not injected. No chemosis.    Left eye: Left conjunctiva is not injected. No chemosis.    Pupils: Pupils are equal, round, and reactive to light.  Cardiovascular:     Rate and Rhythm: Normal rate and regular rhythm.     Heart sounds: Normal heart sounds.  Pulmonary:     Effort: Pulmonary effort is normal. No tachypnea, accessory muscle usage or respiratory distress.     Breath sounds: Normal breath sounds. No wheezing, rhonchi or rales.     Comments: Moving air well in all lung fields.  No increased work of breathing. Chest:     Chest wall: No tenderness.  Abdominal:     Tenderness: There is no abdominal tenderness. There is no guarding or rebound.  Lymphadenopathy:     Head:     Right side of head: No submandibular, tonsillar or occipital adenopathy.     Left side of head: No submandibular, tonsillar or occipital adenopathy.     Cervical: No cervical adenopathy.  Skin:    Coloration: Skin is not pale.     Findings: No abrasion, erythema, petechiae or rash. Rash is not papular, urticarial or vesicular.  Neurological:     Mental Status: He is alert.  Psychiatric:        Behavior: Behavior is cooperative.     Diagnostic studies:    Spirometry: results normal (FEV1: 2.69/81%, FVC: 3.62/88%,  FEV1/FVC: 74%).    Spirometry consistent with normal pattern.   Allergy Studies:  Airborne Adult Perc - 10/10/21 1024     Time Antigen Placed 1024    Allergen Manufacturer Waynette Buttery    Location Back    Number of Test 59    Panel 1 Select    1. Control-Buffer 50% Glycerol Negative    2. Control-Histamine 1 mg/ml 2+    3. Albumin saline Negative    4. Bahia 3+    5. French Southern [place] 3+    6. Johnson Negative    7. Kentucky Blue Negative    8. Meadow Fescue Negative    9. Perennial Rye 3+    10. Sweet Vernal Negative    11. Timothy Negative    12. Cocklebur 2+    13. Burweed Marshelder 3+    14. Ragweed, short 3+    15. Ragweed, Giant 3+    16. Plantain,  English 3+    17. Lamb's Quarters 3+    18. Sheep Sorrell Negative    19. Rough Pigweed Negative    20. Marsh Elder, Rough Negative    21. Mugwort, Common 3+    22. Ash mix 3+    23. Birch mix Negative    24. Beech American Negative    25. Box, Elder 2+    26. Cedar, red 3+    27. Cottonwood, Guinea-Bissau Negative    28. Elm mix Negative    29. Hickory Negative    30. Maple mix Negative    31. Oak, Guinea-[place]au mix Negative    32. Pecan Pollen Negative    33. Pine mix Negative    34. Sycamore Eastern Negative    35. Walnut, Black Pollen Negative    36. Alternaria alternata Negative    37. Cladosporium Herbarum Negative    38. Aspergillus mix Negative    39. Penicillium mix Negative    40. Bipolaris sorokiniana (Helminthosporium) Negative    41. Drechslera spicifera (Curvularia) Negative    42. Mucor plumbeus Negative    43. Fusarium moniliforme Negative    44. Aureobasidium pullulans (pullulara) Negative    45. Rhizopus oryzae Negative    46. Botrytis cinera Negative    47. Epicoccum nigrum Negative    48. Phoma betae Negative    49. Candida Albicans Negative    50. Trichophyton mentagrophytes Negative    51. Mite, D Farinae  5,000 AU/ml Negative    52. Mite, D Pteronyssinus  5,000 AU/ml Negative    53. Cat Hair 10,000  BAU/ml Negative    54.  Dog Epithelia Negative    55. Mixed Feathers Negative    56. Horse Epithelia Negative    57. Cockroach, German Negative    58. Mouse Negative    59. Tobacco Leaf Negative             Allergy testing results were read and interpreted by myself, documented by clinical staff.         Malachi Bonds, MD [facility]

## 2022-01-11 ENCOUNTER — Encounter: Payer: Self-pay | Admitting: Allergy & Immunology

## 2022-01-11 ENCOUNTER — Ambulatory Visit (INDEPENDENT_AMBULATORY_CARE_PROVIDER_SITE_OTHER): Payer: Medicare Other | Admitting: Allergy & Immunology

## 2022-01-11 ENCOUNTER — Other Ambulatory Visit: Payer: Self-pay

## 2022-01-11 VITALS — BP 138/82 | HR 68 | Temp 98.7°F | Resp 16 | Ht 69.75 in | Wt 299.0 lb

## 2022-01-11 DIAGNOSIS — M940 Chondrocostal junction syndrome [Tietze]: Secondary | ICD-10-CM | POA: Diagnosis not present

## 2022-01-11 DIAGNOSIS — J4530 Mild persistent asthma, uncomplicated: Secondary | ICD-10-CM | POA: Diagnosis not present

## 2022-01-11 DIAGNOSIS — J301 Allergic rhinitis due to pollen: Secondary | ICD-10-CM | POA: Diagnosis not present

## 2022-01-11 MED ORDER — BUDESONIDE-FORMOTEROL FUMARATE 80-4.5 MCG/ACT IN AERO: 2.0000 | INHALATION_SPRAY | Freq: Two times a day (BID) | RESPIRATORY_TRACT | 5 refills | Status: DC

## 2022-01-11 MED ORDER — FLUTICASONE PROPIONATE 50 MCG/ACT NA SUSP: 1.0000 | Freq: Every day | NASAL | 5 refills | Status: DC

## 2022-01-11 MED ORDER — ALBUTEROL SULFATE HFA 108 (90 BASE) MCG/ACT IN AERS: 1.0000 | INHALATION_SPRAY | RESPIRATORY_TRACT | 1 refills | Status: DC | PRN

## 2022-01-11 NOTE — Progress Notes (Signed)
? ?FOLLOW UP ? ?Date of Service/Encounter:  01/11/22 ? ? ?Assessment:  ? ?Mild intermittent asthma, uncomplicated ?  ?Seasonal allergic rhinitis due to pollen (grasses, ragweed, weeds, trees) - did not Meadows intradermal testing since he is not interested in allergen immunotherapy ?  ?Negative cardiac work-up ?  ?Costochondritis - getting autoimmune work-up out of an abundance of caution ? ? ?Mr. Blancett continues to endorse chest pain.  He has been thoroughly evaluated by cardiology and has been diagnosed with costochondritis.  He is using some ibuprofen but he is also on Voltaren daily.  I would not want him to overdose on NSAIDs, so I recommended that he hold off on the ibuprofen and talk about increasing the dose of Voltaren.  I personally Meadows not have any experience with dosing Voltaren, so I recommended that he talk to his PCP about that.  I am going to get some labs to rule out autoimmune causes of costochondritis, although this typically presents with more of a pleurisy picture.  Regardless, lung testing looks great today so I Meadows not think he needs prednisone, but I think it might be useful to try Symbicort to see if this provides any better relief of his symptoms, in case there is a component of asthma contributing to his chest pain. ? ?Plan/Recommendations:  ? ?1. Moderate persistent asthma, uncomplicated ?- Lung testing looked great today. ?- We are going to start Symbicort two puffs twice daily (contains an inhaled steroid and a long acting albuterol to help with his breathing). ?- I hope this will help with the chest tightness/chest pain, but keep [place] up to date. ?- Spacer use reviewed. ?- Daily controller medication(s): Symbicort 80/4.43mcg two puffs twice daily with spacer ?- Prior to physical activity: albuterol 2 puffs 10-15 minutes before physical activity. ?- Rescue medications: albuterol 4 puffs every 4-6 hours as needed ?- Asthma control goals:  ?* Full participation in all desired activities (may  need albuterol before activity) ?* Albuterol use two time or less a week on average (not counting use with activity) ?* Cough interfering with sleep two time or less a month ?* Oral steroids no more than once a year ?* No hospitalizations ? ?2. Seasonal allergic rhinitis due to pollen (grasses, ragweed, weeds, and trees.) ?- Continue taking: Zyrtec (cetirizine) 10mg  tablet once daily (or Allegra) and Flonase (fluticasone) one spray per nostril daily ?- You can use an extra dose of the antihistamine, if needed, for breakthrough symptoms.  ?- Consider nasal saline rinses 1-2 times daily to remove allergens from the nasal cavities as well as help with mucous clearance (this is especially helpful to Meadows before the nasal sprays are given) ?- I think we can hold off on allergy shots for now.  ? ?3. Costochondritis ?- We are going to get some labs to rule out autoimmune issues that might be contributing to your symptoms. ?- Take to your PCP about increasing the diclofenac (I personally Meadows not know the dosing of that medication).  ? ?4. Return in about 3 months (around 04/13/2022).  ? ?Subjective:  ? ?Randall Meadows is a 46 y.o. male presenting today for follow up of  ?Chief Complaint  ?Patient presents with  ? Asthma  ?  Says he is currently having chest pain due to heart muscle. Says he went to cardiologist and was diagnosed with something. Says he is using his rescue inhaler.   ? ? ?Randall Meadows has a history of the following: ?Patient Active Problem List  ?  Diagnosis Date Noted  ? ANXIETY 04/03/2009  ? OBESITY 09/07/2007  ? HYPERTENSION 09/07/2007  ? ALLERGIC RHINITIS 09/07/2007  ? HEADACHE 09/07/2007  ? 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 ? ?History obtained from: chart review and patient. ? ?Randall Meadows is a 46 y.o. male presenting for a follow up visit. He was last seen as a New Patient in December 2022. At that time, lung testing looked good. We did not feel that a controller medication  was needed. For his rhinitis, we did testing that was positive to grasses, ragweed, weeds, and trees.  We started him on Zyrtec and Flonase.  We discussed allergy shots. ? ?Since last visit, he has not done well. He reports that he has a lot of chest tightness. He tells me that the "muscles around [his] heart". It seems that this was diagnosed as costochondritis. He kept going back and forth from the ED. He was referred to Cardiology and his workup was all normal. He was told to take the muscle relaxers which seems to help. Sometimes it does not always help. He has tried ibuprofen for these symptoms and this is not cutting it. He did start the diclofenac which helped initially but is no longer helping. He is unsure whether he can go up to on the dose.  ? ?He had a bad headache last night and took some ibuprofen which did hlep with the headache. But it did not help with the chest pain. Stress is back this week and full force. His mother had taken over for management of his grandmother.  ? ?Asthma/Respiratory Symptom History: He has been using his rescue inhaler through January into February. He was sick a lot during that time. He started with allergies and then coughing. He went to see his PCP and he got a shot and was started on antibiotics. But the inhaler was not helping much with the coughing and SOB and wheezing.  ? ?Allergic Rhinitis Symptom History: He is using the Flonase and it seemed to help a whole lot. He has not needed to use it any more.  He used Human resources officer OTC which seems to have helped too.  He does not think that he needs allergen immunotherapy at this point.  ? ?Otherwise, there have been no changes to his past medical history, surgical history, family history, or social history. ? ? ? ?Review of Systems  ?Constitutional: Negative.  Negative for chills, fever, malaise/fatigue and weight loss.  ?HENT: Negative.  Negative for congestion, ear discharge and ear pain.   ?Eyes:  Negative for pain, discharge  and redness.  ?Respiratory:  Negative for cough, sputum production, shortness of breath and wheezing.   ?Cardiovascular:  Positive for chest pain. Negative for palpitations.  ?Gastrointestinal:  Negative for abdominal pain, constipation, diarrhea, heartburn, nausea and vomiting.  ?Skin: Negative.  Negative for itching and rash.  ?Neurological:  Negative for dizziness and headaches.  ?Endo/Heme/Allergies:  Negative for environmental allergies. Does not bruise/bleed easily.   ? ? ? ?Objective:  ? ?Blood pressure 138/82, pulse 68, temperature 98.7 ?F (37.1 ?C), temperature source Temporal, resp. rate 16, height 5' 9.75" (1.772 m), weight 299 lb (135.6 kg), SpO2 98 %. ?Body mass index is 43.21 kg/m?. ? ? ? ?Physical Exam ?Vitals reviewed.  ?Constitutional:   ?   Appearance: He is well-developed.  ?   Comments: Very talkative.   ?HENT:  ?   Head: Normocephalic and atraumatic.  ?   Right Ear: Tympanic membrane, ear canal and  external ear normal. No drainage, swelling or tenderness. Tympanic membrane is not injected, scarred, erythematous, retracted or bulging.  ?   Left Ear: Tympanic membrane, ear canal and external ear normal. No drainage, swelling or tenderness. Tympanic membrane is not injected, scarred, erythematous, retracted or bulging.  ?   Nose: No nasal deformity, septal deviation, mucosal edema or rhinorrhea.  ?   Right Turbinates: Enlarged, swollen and pale.  ?   Left Turbinates: Enlarged, swollen and pale.  ?   Right Sinus: No maxillary sinus tenderness or frontal sinus tenderness.  ?   Left Sinus: No maxillary sinus tenderness or frontal sinus tenderness.  ?   Mouth/Throat:  ?   Mouth: Mucous membranes are not pale and not dry.  ?   Pharynx: Uvula midline.  ?Eyes:  ?   General:     ?   Right eye: No discharge.     ?   Left eye: No discharge.  ?   Conjunctiva/sclera: Conjunctivae normal.  ?   Right eye: Right conjunctiva is not injected. No chemosis. ?   Left eye: Left conjunctiva is not injected. No  chemosis. ?   Pupils: Pupils are equal, round, and reactive to light.  ?Cardiovascular:  ?   Rate and Rhythm: Normal rate and regular rhythm.  ?   Heart sounds: Normal heart sounds.  ?Pulmonary:  ?   Effort: Pulmo

## 2022-01-11 NOTE — Patient Instructions (Addendum)
1. Moderate persistent asthma, uncomplicated ?- Lung testing looked great today. ?- We are going to start Symbicort two puffs twice daily (contains an inhaled steroid and a long acting albuterol to help with his breathing). ?- I hope this will help with the chest tightness/chest pain, but keep [place] up to date. ?- Spacer use reviewed. ?- Daily controller medication(s): Symbicort 80/4.27mcg two puffs twice daily with spacer ?- Prior to physical activity: albuterol 2 puffs 10-15 minutes before physical activity. ?- Rescue medications: albuterol 4 puffs every 4-6 hours as needed ?- Asthma control goals:  ?* Full participation in all desired activities (may need albuterol before activity) ?* Albuterol use two time or less a week on average (not counting use with activity) ?* Cough interfering with sleep two time or less a month ?* Oral steroids no more than once a year ?* No hospitalizations ? ?2. Seasonal allergic rhinitis due to pollen (grasses, ragweed, weeds, and trees.) ?- Continue taking: Zyrtec (cetirizine) 10mg  tablet once daily (or Allegra) and Flonase (fluticasone) one spray per nostril daily ?- You can use an extra dose of the antihistamine, if needed, for breakthrough symptoms.  ?- Consider nasal saline rinses 1-2 times daily to remove allergens from the nasal cavities as well as help with mucous clearance (this is especially helpful to do before the nasal sprays are given) ?- I think we can hold off on allergy shots for now.  ? ?3. Costochondritis ?- We are going to get some labs to rule out autoimmune issues that might be contributing to your symptoms. ?- Take to your PCP about increasing the diclofenac (I personally do not know the dosing of that medication).  ? ?4. Return in about 3 months (around 04/13/2022).  ? ? ?Please inform 04/15/2022 of any Emergency Department visits, hospitalizations, or changes in symptoms. Call us before going to the ED for breathing or allergy symptoms since we might be able to fit you in  for a sick visit. Feel free to contact us anytime with any questions, problems, or concerns. ? ?It was a pleasure to see you today! ? ?Websites that have reliable patient information: ?1. American Academy of Asthma, Allergy, and Immunology: www.aaaai.org ?2. Food Allergy Research and Education (FARE): foodallergy.org ?3. Mothers of Asthmatics: http://www.asthmacommunitynetwork.org ?4. [place] of Allergy, Asthma, and Immunology: [employer] ? ? ?COVID-19 Vaccine Information can be found at: MissingWeapons.ca For questions related to vaccine distribution or appointments, please email [email or web address] or call [phone].  ? ?We realize that you might be concerned about having an allergic reaction to the COVID19 vaccines. To help with that concern, WE ARE OFFERING THE COVID19 VACCINES IN OUR OFFICE! Ask the front desk for dates!  ? ? ? ??Like? [phone] on Facebook and Instagram for our latest updates!  ?  ? ? ?A healthy democracy works best when [place] participate! Make sure you are registered to vote! If you have moved or changed any of your contact information, you will need to get this updated before voting! ? ?In some cases, you MAY be able to register to vote online: [employer] ? ? ? ? ? ? ? ? ? ?

## 2022-01-16 LAB — SEDIMENTATION RATE: Sed Rate: 12 mm/hr (ref 0–15)

## 2022-01-16 LAB — FANA STAINING PATTERNS: Speckled Pattern: 1:80 {titer}

## 2022-01-16 LAB — C-REACTIVE PROTEIN: CRP: 16 mg/L — ABNORMAL HIGH (ref 0–10)

## 2022-01-16 LAB — ANTINUCLEAR ANTIBODIES, IFA: ANA Titer 1: POSITIVE — AB

## 2022-01-21 ENCOUNTER — Telehealth: Payer: Self-pay

## 2022-01-21 NOTE — Telephone Encounter (Signed)
It looks like the patient is not active on his MyChart. Could someone give the patient a call about his lab results before I place the referral to Rheumatology? ? ?Thanks  ? ?

## 2022-01-21 NOTE — Telephone Encounter (Signed)
-----   Message from Alfonse Spruce, MD sent at 01/16/2022  8:02 AM EDT ----- ?MyChart message sent. He might want a Rheumatology referral, Dee. ICD 10 code R76. 0. ? ?Malachi Bonds, MD ?[facility] ? ?

## 2022-01-21 NOTE — Telephone Encounter (Signed)
I went over lab results with the patient and he would like to be referred to a rheumatologist.  ?

## 2022-01-23 NOTE — Telephone Encounter (Signed)
Patient's referral has been placed to the following for review: ? ?[facility] ?[address] ?[place],  [place]  [address] ?Main: [phone] ? ?Patient has been informed of this information.He states he will give our office a call if he doesn't hear anything in the next 3-5 Business Days.  ? ?

## 2022-02-20 IMAGING — CT CT HEAD W/O CM
3 series · 15 of 47 positions shown, 18 images · non-contrast
Comparison: Head CT January 21, 2014

CLINICAL DATA: Cerebral hemorrhage suspected.  Worsening headaches.

EXAM:
CT HEAD WITHOUT CONTRAST
TECHNIQUE: Contiguous axial images were obtained from the base of the skull
through the vertex without intravenous contrast.

[Series 2: head w o · axial · 0.45mm/px · z∈[-20,+115]mm · 9 of 33 slices shown, 12 images]
[im 3/33  brain]
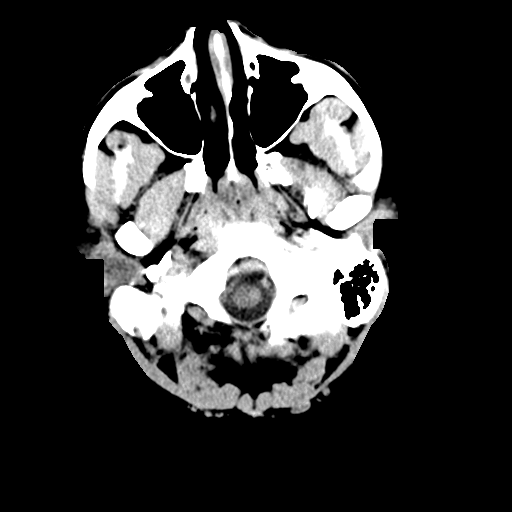
[im 3/33  bone]
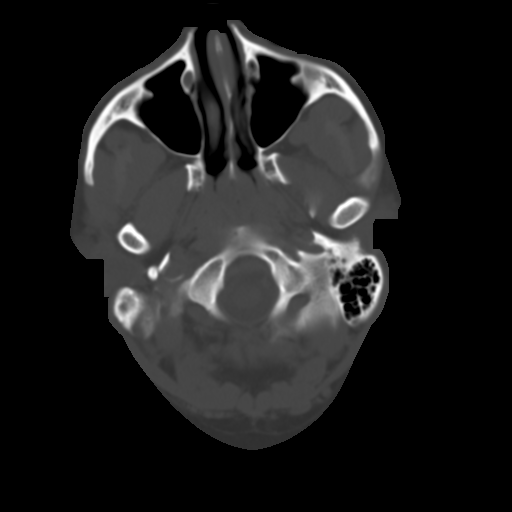
[im 6/33  brain]
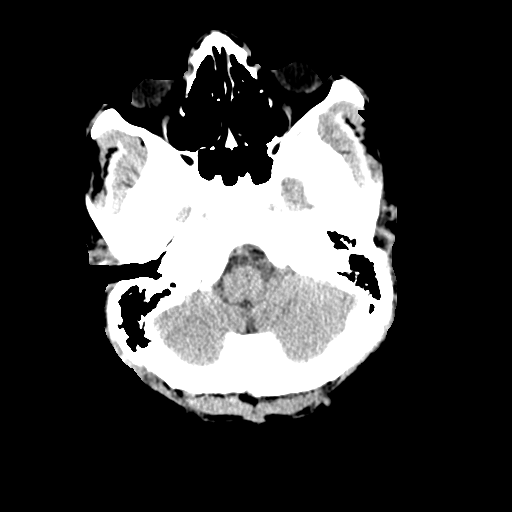
[im 9/33  brain]
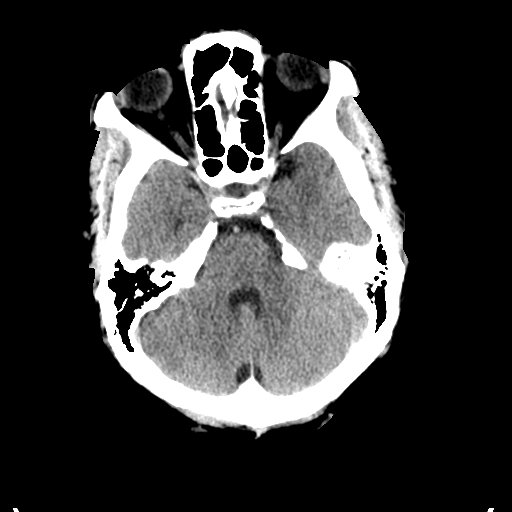
[im 13/33  brain]
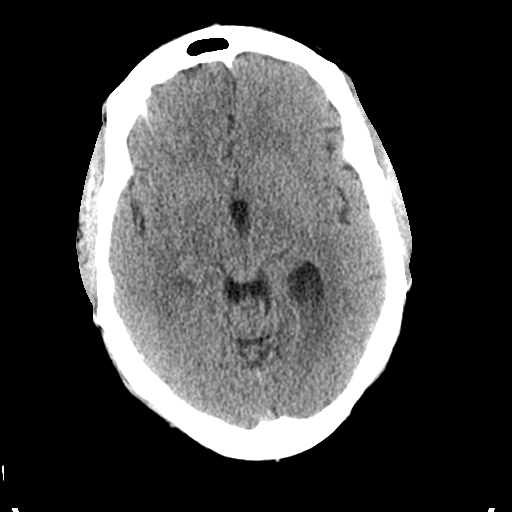
[im 17/33  brain]
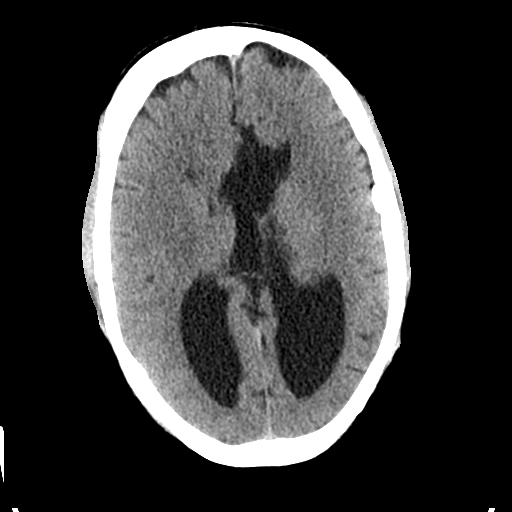
[im 17/33  bone]
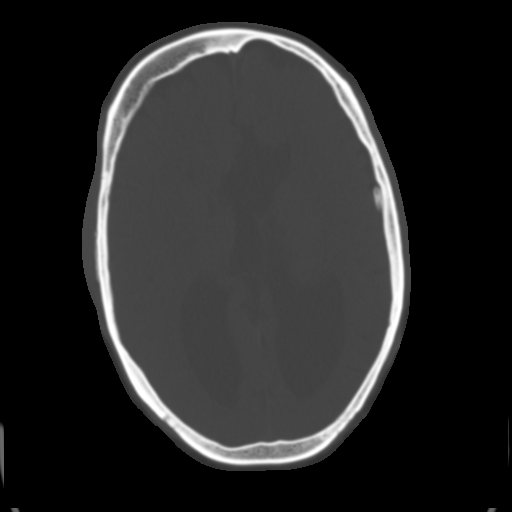
[im 20/33  brain]
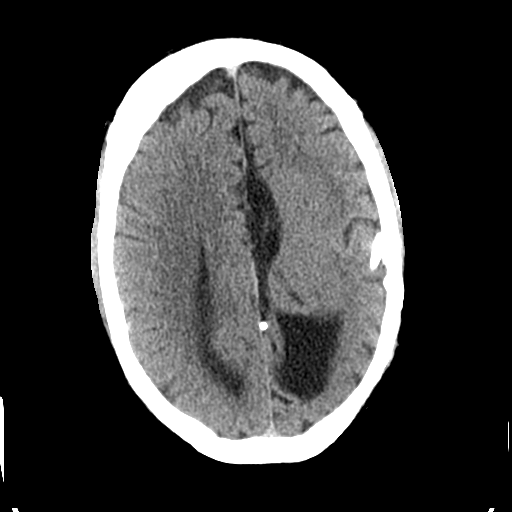
[im 24/33  brain]
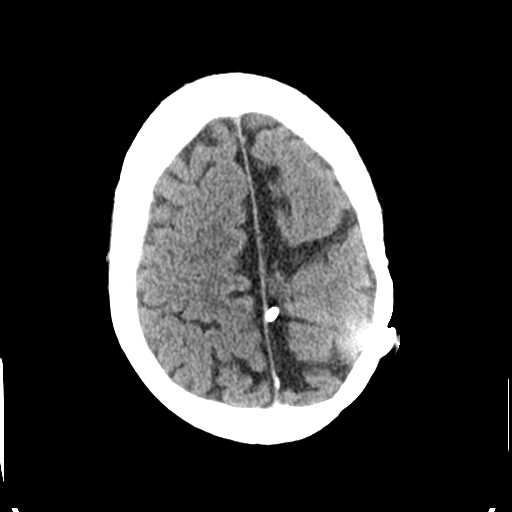
[im 27/33  brain]
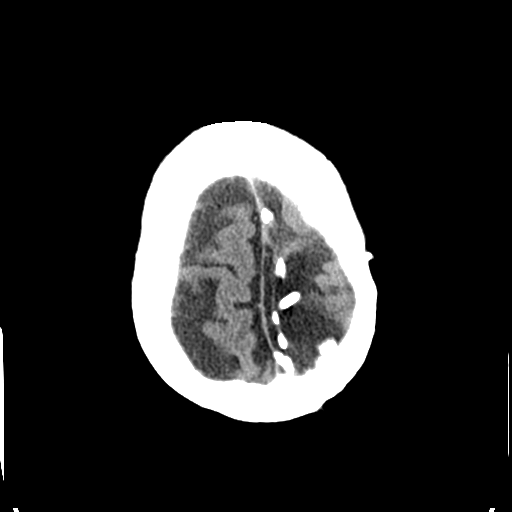
[im 30/33  brain]
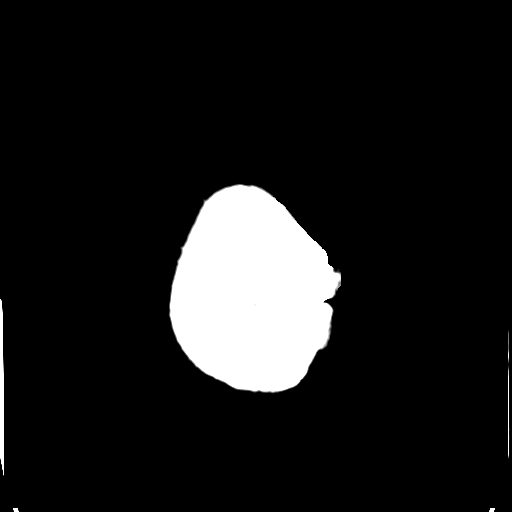
[im 30/33  bone]
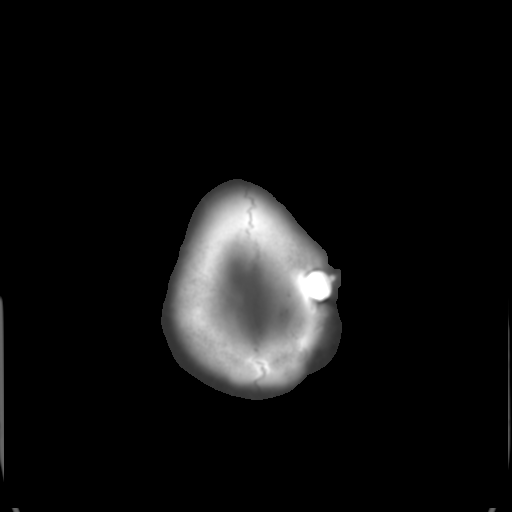

[Series 4: coronal soft · coronal · 0.34mm/px · 3 of 70 slices shown]
[im 24/70  brain]
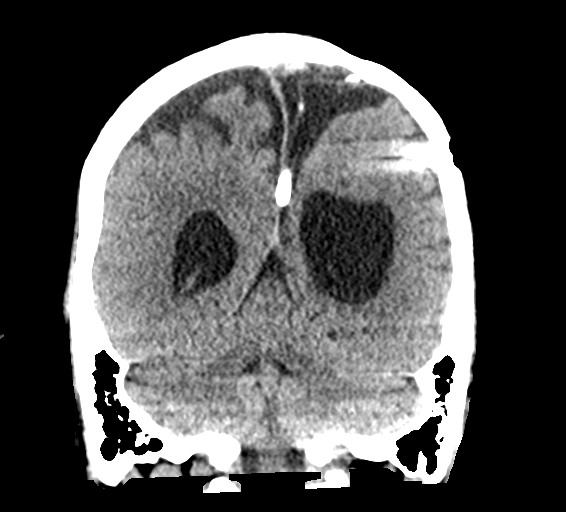
[im 31/70  brain]
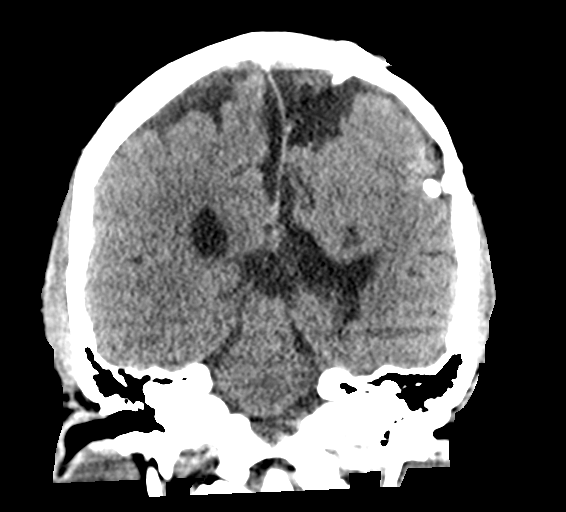
[im 39/70  brain]
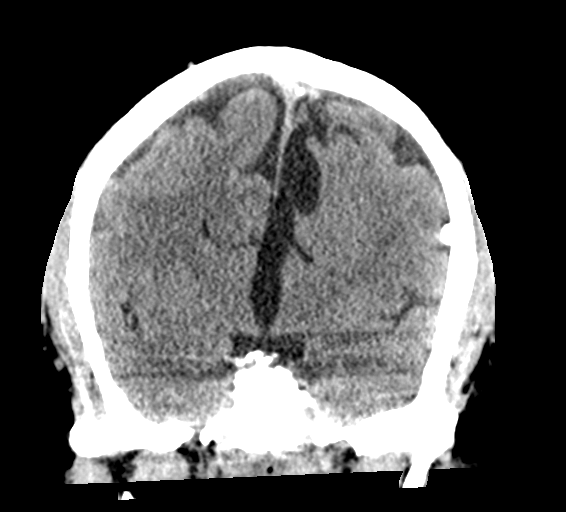

[Series 5: sagittal soft · sagittal · 0.33mm/px · 3 of 55 slices shown]
[im 19/55  brain]
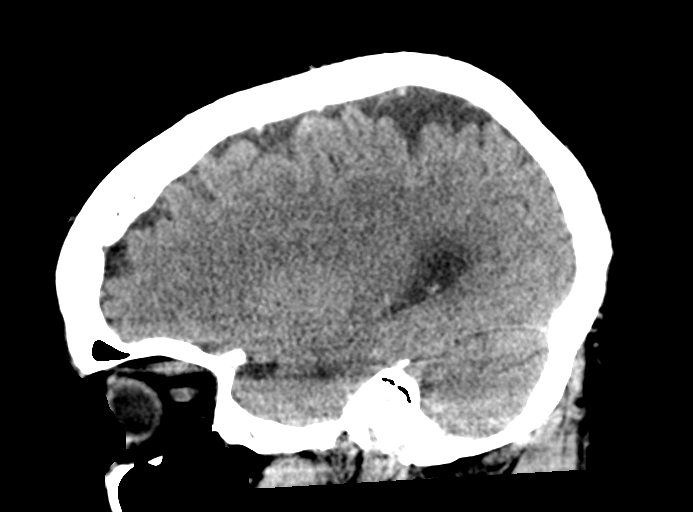
[im 28/55  brain]
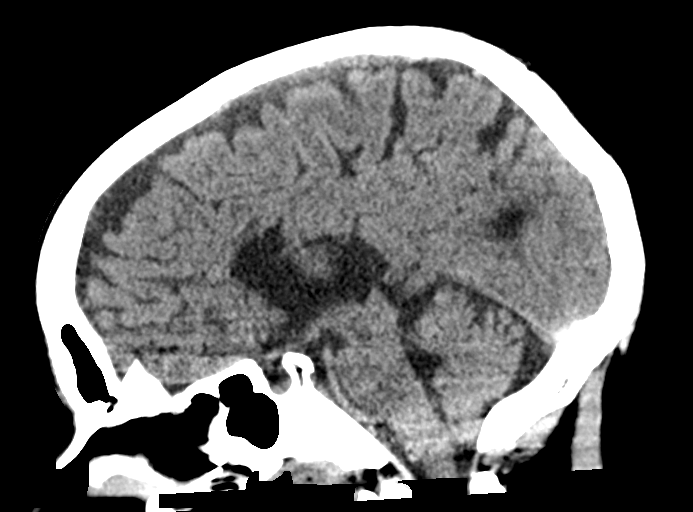
[im 37/55  brain]
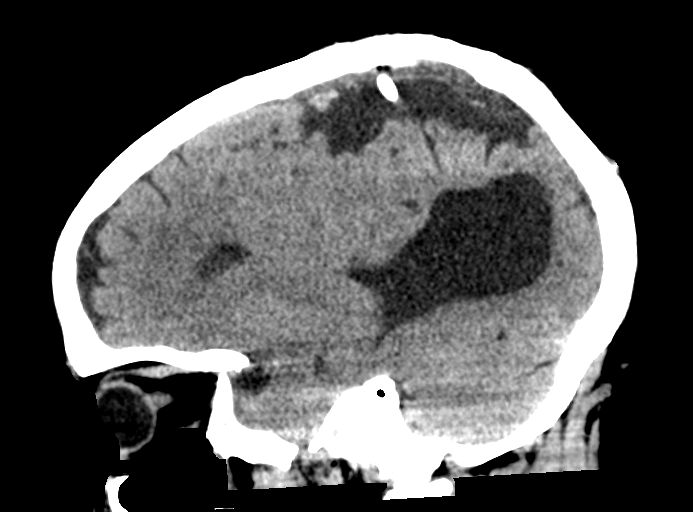

[15 of 47 positions shown; findings below may reference images not displayed]

FINDINGS: Brain: No evidence of acute infarction or hemorrhage.

Stable appearance of congenital malformations with agenesis of the
corpus callosum with associated interhemispheric cyst along the
cerebral falx on the left, left frontoparietal gray matter
heterotopia with associated left frontal schizencephaly and
colpocephaly. Two shunts are again seen, one in the interhemispheric
fissure to the left of the cerebral falx and the second along the
left cerebral convexity. Stable size of the ventricular system and
extra-axial CSF spaces.

Vascular: No hyperdense vessel.

Skull: Normal. Negative for fracture or focal lesion.

Sinuses/Orbits: No acute finding.

Other: None.
IMPRESSION: 1. No acute intracranial pathology.
2. Stable appearance of congenital malformations as described above.
3. Stable size of the ventricular system and extra-axial CSF spaces.

## 2022-03-01 ENCOUNTER — Other Ambulatory Visit: Payer: Self-pay | Admitting: Physician Assistant

## 2022-03-01 NOTE — Telephone Encounter (Signed)
Rx(s) sent to pharmacy electronically.  

## 2022-03-14 LAB — LAB REPORT - SCANNED
A1c: 5.7
EGFR: 80
PSA, Total: 1.64

## 2022-04-17 ENCOUNTER — Ambulatory Visit: Payer: Medicare Other | Admitting: Allergy & Immunology

## 2022-06-03 ENCOUNTER — Other Ambulatory Visit: Payer: Self-pay | Admitting: Cardiology

## 2022-06-03 NOTE — Telephone Encounter (Signed)
Rx(s) sent to pharmacy electronically.  

## 2022-06-14 NOTE — Progress Notes (Deleted)
Office Visit Note  Patient: Randall Meadows             Date of Birth: Apr 13, 1976           MRN: [id number]             PCP: Lorene Dy, MD Referring: Valentina Shaggy, * Visit Date: 06/26/2022 Occupation: @GUAROCC @  Subjective:  No chief complaint on file.   History of Present Illness: Randall Meadows is a 46 y.o. male ***   Activities of Daily Living:  Patient reports morning stiffness for *** {minute/hour:19697}.   Patient {ACTIONS;DENIES/REPORTS:21021675::"Denies"} nocturnal pain.  Difficulty dressing/grooming: {ACTIONS;DENIES/REPORTS:21021675::"Denies"} Difficulty climbing stairs: {ACTIONS;DENIES/REPORTS:21021675::"Denies"} Difficulty getting out of chair: {ACTIONS;DENIES/REPORTS:21021675::"Denies"} Difficulty using hands for taps, buttons, cutlery, and/or writing: {ACTIONS;DENIES/REPORTS:21021675::"Denies"}  No Rheumatology ROS completed.   PMFS History:  Patient Active Problem List   Diagnosis Date Noted   ANXIETY 04/03/2009   OBESITY 09/07/2007   HYPERTENSION 09/07/2007   ALLERGIC RHINITIS 09/07/2007   HEADACHE 09/07/2007   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/2007    Past Medical History:  Diagnosis Date   Hypertension     Family History  Problem Relation Age of Onset   Urticaria Maternal Grandmother    Food Allergy Maternal Grandmother    Allergic rhinitis Son    Allergic rhinitis Son    Past Surgical History:  Procedure Laterality Date   TONSILLECTOMY     Social History   Social History Narrative   Not on file   Immunization History  Administered Date(s) Administered   PFIZER(Purple Top)SARS-COV-2 Vaccination 02/10/2020, 03/06/2020     Objective: Vital Signs: There were no vitals taken for this visit.   Physical Exam   Musculoskeletal Exam: ***  CDAI Exam: CDAI Score: -- Patient Global: --; Provider Global: -- Swollen: --; Tender: -- Joint Exam 06/26/2022   No joint exam has been documented for this  visit   There is currently no information documented on the homunculus. Go to the Rheumatology activity and complete the homunculus joint exam.  Investigation: No additional findings.  Imaging: No results found.  Recent Labs: Lab Results  Component Value Date   WBC 7.6 02/15/2021   HGB 15.8 02/15/2021   PLT 270 02/15/2021   NA 136 02/15/2021   K 4.0 02/15/2021   CL 101 02/15/2021   CO2 27 02/15/2021   GLUCOSE 95 02/15/2021   BUN 15 02/15/2021   CREATININE 1.07 02/15/2021   BILITOT 0.8 01/31/2021   ALKPHOS 80 01/31/2021   AST 22 01/31/2021   ALT 27 01/31/2021   PROT 8.3 (H) 01/31/2021   ALBUMIN 4.2 01/31/2021   CALCIUM 9.3 02/15/2021    Speciality Comments: No specialty comments available.  Procedures:  No procedures performed Allergies: Patient has no known allergies.   Assessment / Plan:     Visit Diagnoses: Elevated C-reactive protein (CRP)  Positive ANA (antinuclear antibody) - 01/11/22: ANA 1:80speckled, CRP 16, ESR 12  Essential hypertension  History of anxiety  Orders: No orders of the defined types were placed in this encounter.  No orders of the defined types were placed in this encounter.   Face-to-face time spent with patient was *** minutes. Greater than 50% of time was spent in counseling and coordination of care.  Follow-Up Instructions: No follow-ups on file.   Ofilia Neas, PA-C  Note - This record has been created using Dragon software.  Chart creation errors have been sought, but may not always  have been located. Such creation errors do  not reflect on  the standard of medical care.

## 2022-06-21 ENCOUNTER — Ambulatory Visit (INDEPENDENT_AMBULATORY_CARE_PROVIDER_SITE_OTHER): Payer: Medicare Other | Admitting: Allergy & Immunology

## 2022-06-21 ENCOUNTER — Ambulatory Visit (HOSPITAL_COMMUNITY)
Admission: RE | Admit: 2022-06-21 | Discharge: 2022-06-21 | Disposition: A | Discharge status: Home / Self Care | Payer: Medicare Other | Source: Ambulatory Visit | Attending: Allergy & Immunology | Admitting: Allergy & Immunology

## 2022-06-21 ENCOUNTER — Encounter: Payer: Self-pay | Admitting: Allergy & Immunology

## 2022-06-21 VITALS — BP 110/70 | HR 68 | Temp 98.2°F | Resp 16 | Ht 70.0 in | Wt 297.2 lb

## 2022-06-21 DIAGNOSIS — M79672 Pain in left foot: Secondary | ICD-10-CM | POA: Diagnosis present

## 2022-06-21 DIAGNOSIS — J4530 Mild persistent asthma, uncomplicated: Secondary | ICD-10-CM | POA: Diagnosis not present

## 2022-06-21 DIAGNOSIS — J301 Allergic rhinitis due to pollen: Secondary | ICD-10-CM | POA: Diagnosis not present

## 2022-06-21 MED ORDER — BUDESONIDE-FORMOTEROL FUMARATE 80-4.5 MCG/ACT IN AERO: 2.0000 | INHALATION_SPRAY | Freq: Two times a day (BID) | RESPIRATORY_TRACT | 5 refills | Status: DC

## 2022-06-21 MED ORDER — ALBUTEROL SULFATE HFA 108 (90 BASE) MCG/ACT IN AERS: 1.0000 | INHALATION_SPRAY | RESPIRATORY_TRACT | 1 refills | Status: DC | PRN

## 2022-06-21 MED ORDER — FLUTICASONE PROPIONATE 50 MCG/ACT NA SUSP: 1.0000 | Freq: Every day | NASAL | 5 refills | Status: DC

## 2022-06-21 NOTE — Patient Instructions (Addendum)
1. Moderate persistent asthma, uncomplicated - Lung testing looked great today. - We are not going to make any medication changes at this time.  - The Symbicort seems to be helping a lot.  - Daily controller medication(s): Symbicort 80/4.64mcg two puffs twice daily with spacer - Prior to physical activity: albuterol 2 puffs 10-15 minutes before physical activity. - Rescue medications: albuterol 4 puffs every 4-6 hours as needed - Asthma control goals:  * Full participation in all desired activities (may need albuterol before activity) * Albuterol use two time or less a week on average (not counting use with activity) * Cough interfering with sleep two time or less a month * Oral steroids no more than once a year * No hospitalizations  2. Seasonal allergic rhinitis due to pollen (grasses, ragweed, weeds, and trees.) - Continue taking: Zyrtec (cetirizine) 10mg  tablet once daily (or Allegra) and Flonase (fluticasone) one spray per nostril daily - You can use an extra dose of the antihistamine, if needed, for breakthrough symptoms.  - Consider nasal saline rinses 1-2 times daily to remove allergens from the nasal cavities as well as help with mucous clearance (this is especially helpful to do before the nasal sprays are given) - Let's hold off on allergy shots.   3. Left foot pain and joint pain - I will send my note to Dr. . - I am ordering an X-ray of your left foot. - We will call you with the results of the testing.   4. Return in about 3 months (around 09/21/2022).    Please inform 09/23/2022 of any Emergency Department visits, hospitalizations, or changes in symptoms. Call us before going to the ED for breathing or allergy symptoms since we might be able to fit you in for a sick visit. Feel free to contact us anytime with any questions, problems, or concerns.  It was a pleasure to see you today!  Websites that have reliable patient information: 1. American Academy of Asthma,  Allergy, and Immunology: www.aaaai.org 2. Food Allergy Research and Education (FARE): foodallergy.org 3. Mothers of Asthmatics: http://www.asthmacommunitynetwork.org 4. American College of Allergy, Asthma, and Immunology: www.acaai.org   COVID-19 Vaccine Information can be found at: [place] For questions related to vaccine distribution or appointments, please email [email or web address] or call [phone].   We realize that you might be concerned about having an allergic reaction to the COVID19 vaccines. To help with that concern, WE ARE OFFERING THE COVID19 VACCINES IN OUR OFFICE! Ask the front desk for dates!     "Like" [phone] on Facebook and Instagram for our latest updates!      A healthy democracy works best when [place] participate! Make sure you are registered to vote! If you have moved or changed any of your contact information, you will need to get this updated before voting!  In some cases, you MAY be able to register to vote online: [employer]

## 2022-06-21 NOTE — Progress Notes (Signed)
FOLLOW UP  Date of Service/Encounter:  06/21/22   Assessment:   Mild intermittent asthma, uncomplicated   Seasonal allergic rhinitis due to pollen (grasses, ragweed, weeds, trees) - did not do intradermal testing since Randall Meadows is not interested in allergen immunotherapy   Negative cardiac work-up   Costochondritis and joint pain (especially left ankle) - with mildly elevated ANA (1:80)    Plan/Recommendations:   1. Moderate persistent asthma, uncomplicated - Lung testing looked great today. - We are not going to make any medication changes at this time.  - The Symbicort seems to be helping a lot.  - Daily controller medication(s): Symbicort 80/4.16mcg two puffs twice daily with spacer - Prior to physical activity: albuterol 2 puffs 10-15 minutes before physical activity. - Rescue medications: albuterol 4 puffs every 4-6 hours as needed - Asthma control goals:  * Full participation in all desired activities (may need albuterol before activity) * Albuterol use two time or less a week on average (not counting use with activity) * Cough interfering with sleep two time or less a month * Oral steroids no more than once a year * No hospitalizations  2. Seasonal allergic rhinitis due to pollen (grasses, ragweed, weeds, and trees.) - Continue taking: Zyrtec (cetirizine) 10mg  tablet once daily (or Allegra) and Flonase (fluticasone) one spray per nostril daily - You can use an extra dose of the antihistamine, if needed, for breakthrough symptoms.  - Consider nasal saline rinses 1-2 times daily to remove allergens from the nasal cavities as well as help with mucous clearance (this is especially helpful to do before the nasal sprays are given) - Let's hold off on allergy shots.   3. Left foot pain and joint pain - I will send my note to Dr. . - I am ordering an X-ray of your left foot. - We will call you with the results of the testing.   4. Return in about 3 months (around  09/21/2022).    Subjective:   Randall Meadows is a 46 y.o. male presenting today for follow up of  Chief Complaint  Patient presents with   Asthma    States Randall Meadows used his albuterol two weeks ago but feels ok now.    50 has a history of the following: Patient Active Problem List   Diagnosis Date Noted   ANXIETY 04/03/2009   OBESITY 09/07/2007   HYPERTENSION 09/07/2007   ALLERGIC RHINITIS 09/07/2007   HEADACHE 09/07/2007   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/2007    History obtained from: chart review and patient.  Randall Meadows is a 46 y.o. male presenting for a follow up visit.  Randall Meadows was last seen in March 2023.  At that time, Randall Meadows was having chest pain and had thoroughly been evaluated by cardiology.  Randall Meadows was diagnosed with costochondritis.  Randall Meadows was using ibuprofen as well as Voltaren.  His lung testing looked great.  We did order an autoimmune work-up out of an abundance of caution in we are dealing with something like pleurisy.  His ANA was 1:80, but inflammatory markers were normal.  His lung testing looked great.  We started Symbicort 2 puffs twice daily in hopes that this will help with the chest tightness and chest pain.  For his allergic rhinitis, we continue with Zyrtec or Allegra and Flonase.  Since last visit, Randall Meadows has done "terrible" health wise. Randall Meadows has been experiencing a lot of stress. Randall Meadows lost his older brother in June and then two weeks  later Randall Meadows lost his cousin.   Asthma/Respiratory Symptom History: Symbicort has been helping a lot. Randall Meadows is taking two puffs twice daily. Randall Meadows is having a lot of stress. Randall Meadows does think that the Symbicort has helped with his symptoms. Randall Meadows is using the two puffs twice daily every day.    Allergic Rhinitis Symptom History: Randall Meadows takes Benadryl if Randall Meadows goes outside. Mostly Randall Meadows stays inside. Randall Meadows does not do yard work any longer.  Randall Meadows no longer has to do the yard work for his grandmother. Randall Meadows remains on the cetirizine and the fluticasone. Randall Meadows  does not feel that Randall Meadows needs allergy shots at this point.   Randall Meadows is going to see a Rheumatologist on August 30th. Randall Meadows is going to see Dr. Corliss Skains. His PCP placed him on Voltaren gel. Randall Meadows has pain on the side on top of his foot. Randall Meadows took some Tylenol this morning and a Motrin without improvement at all. Randall Meadows has not had any X-rays, but Randall Meadows has been ahving some balance issues. Randall Meadows thinks that this pain is from increased need on relying on left side. Randall Meadows has had gout a couple of months ago. Randall Meadows was put on prednisone which did help. Randall Meadows does not think that the dose from Dr. Su Hilt was high enough to full clear it up. Randall Meadows tells me that Randall Meadows was on another medication for gout, but Randall Meadows does not remember the name of it.  Randall Meadows is thinking of going to a 4 year college. Randall Meadows just graduated from high school. Randall Meadows started [employer] August 14th. His brother has been in [facility] [facility] for while now. Randall Meadows is going to be re-registering to enter school again.   Otherwise, there have been no changes to his past medical history, surgical history, family history, or social history.    Review of Systems  Constitutional: Negative.  Negative for chills, fever, malaise/fatigue and weight loss.  HENT:  Negative for congestion, ear discharge, ear pain and sinus pain.   Eyes:  Negative for pain, discharge and redness.  Respiratory:  Positive for shortness of breath. Negative for cough, sputum production and wheezing.   Cardiovascular: Negative.  Negative for chest pain and palpitations.  Gastrointestinal:  Negative for abdominal pain, constipation, diarrhea, heartburn, nausea and vomiting.  Skin: Negative.  Negative for itching and rash.  Neurological:  Negative for dizziness and headaches.  Endo/Heme/Allergies:  Negative for environmental allergies. Does not bruise/bleed easily.       Objective:   Blood pressure 110/70, pulse 68, temperature 98.2 F (36.8 C), temperature source Temporal, resp. rate 16, height 5\' 10"  (1.778 m), weight 297 lb  3.2 oz (134.8 kg), SpO2 95 %. Body mass index is 42.64 kg/m.    Physical Exam Vitals reviewed.  Constitutional:      Appearance: Randall Meadows is well-developed.     Comments: Very talkative.   HENT:     Head: Normocephalic and atraumatic.     Right Ear: Tympanic membrane, ear canal and external ear normal. No drainage, swelling or tenderness. Tympanic membrane is not injected, scarred, erythematous, retracted or bulging.     Left Ear: Tympanic membrane, ear canal and external ear normal. No drainage, swelling or tenderness. Tympanic membrane is not injected, scarred, erythematous, retracted or bulging.     Nose: No nasal deformity, septal deviation, mucosal edema or rhinorrhea.     Right Turbinates: Enlarged, swollen and pale.     Left Turbinates: Enlarged, swollen and pale.     Right Sinus: No maxillary sinus tenderness or frontal  sinus tenderness.     Left Sinus: No maxillary sinus tenderness or frontal sinus tenderness.     Mouth/Throat:     Mouth: Mucous membranes are not pale and not dry.     Pharynx: Uvula midline.  Eyes:     General: Allergic shiner present.        Right eye: No discharge.        Left eye: No discharge.     Conjunctiva/sclera: Conjunctivae normal.     Right eye: Right conjunctiva is not injected. No chemosis.    Left eye: Left conjunctiva is not injected. No chemosis.    Pupils: Pupils are equal, round, and reactive to light.  Cardiovascular:     Rate and Rhythm: Normal rate and regular rhythm.     Heart sounds: Normal heart sounds.  Pulmonary:     Effort: Pulmonary effort is normal. No tachypnea, accessory muscle usage or respiratory distress.     Breath sounds: Normal breath sounds. No wheezing, rhonchi or rales.     Comments: Moving air well in all lung fields.  No increased work of breathing. Chest:     Chest wall: No tenderness.  Abdominal:     Tenderness: There is no abdominal tenderness. There is no guarding or rebound.  Musculoskeletal:     Comments:  Tenderness over the dorsum of the left foot.   Lymphadenopathy:     Head:     Right side of head: No submandibular, tonsillar or occipital adenopathy.     Left side of head: No submandibular, tonsillar or occipital adenopathy.     Cervical: No cervical adenopathy.  Skin:    General: Skin is warm.     Capillary Refill: Capillary refill takes less than 2 seconds.     Coloration: Skin is not pale.     Findings: No abrasion, erythema, petechiae or rash. Rash is not papular, urticarial or vesicular.     Comments: There are multiple insect bites over the bilateral lower extremities.   Neurological:     Mental Status: Randall Meadows is alert.  Psychiatric:        Behavior: Behavior is cooperative.      Diagnostic studies:   Spirometry: results normal (FEV1: 2.38/70%, FVC: 3.38/79%, FEV1/FVC: 70%).    Spirometry consistent with normal pattern.   Allergy Studies: none        Randall Meadows Bonds, MD  [facility]

## 2022-06-25 ENCOUNTER — Ambulatory Visit
Admission: RE | Admit: 2022-06-25 | Discharge: 2022-06-25 | Disposition: A | Discharge status: Home / Self Care | Payer: Medicare Other | Source: Ambulatory Visit | Attending: Internal Medicine | Admitting: Internal Medicine

## 2022-06-25 ENCOUNTER — Other Ambulatory Visit: Payer: Self-pay | Admitting: Internal Medicine

## 2022-06-25 DIAGNOSIS — M79605 Pain in left leg: Secondary | ICD-10-CM

## 2022-06-26 ENCOUNTER — Ambulatory Visit: Payer: Medicaid Other | Admitting: Rheumatology

## 2022-06-26 DIAGNOSIS — R7982 Elevated C-reactive protein (CRP): Secondary | ICD-10-CM

## 2022-06-26 DIAGNOSIS — Z8659 Personal history of other mental and behavioral disorders: Secondary | ICD-10-CM

## 2022-06-26 DIAGNOSIS — I10 Essential (primary) hypertension: Secondary | ICD-10-CM

## 2022-06-26 DIAGNOSIS — R768 Other specified abnormal immunological findings in serum: Secondary | ICD-10-CM

## 2022-07-03 ENCOUNTER — Telehealth: Payer: Self-pay | Admitting: Allergy & Immunology

## 2022-07-03 ENCOUNTER — Telehealth: Payer: Self-pay | Admitting: Orthopedic Surgery

## 2022-07-03 ENCOUNTER — Encounter: Payer: Self-pay | Admitting: Orthopedic Surgery

## 2022-07-03 NOTE — Telephone Encounter (Signed)
Patient called to inquire about scheduling appointment with Dr Romeo Apple for leg (and foot) for which Xrays were done at [facility] for leg/tib-fib, and at primary care office for foot. York Spaniel will have primary care send a referral since he just was seen there today.

## 2022-07-03 NOTE — Telephone Encounter (Signed)
Patient called to get his results for his x ray. Patient states he does not get on mychart as often as it is a bit confusing. I read out to patient Dr. Tyson Dense message. Patient verbalized understanding.

## 2022-07-04 NOTE — Telephone Encounter (Signed)
Great - thanks for doing that!   Malachi Bonds, MD [facility]

## 2022-07-08 ENCOUNTER — Ambulatory Visit (INDEPENDENT_AMBULATORY_CARE_PROVIDER_SITE_OTHER): Payer: Medicare Other | Admitting: Orthopedic Surgery

## 2022-07-08 ENCOUNTER — Ambulatory Visit (INDEPENDENT_AMBULATORY_CARE_PROVIDER_SITE_OTHER): Payer: Medicare Other

## 2022-07-08 ENCOUNTER — Encounter: Payer: Self-pay | Admitting: Orthopedic Surgery

## 2022-07-08 VITALS — BP 160/94 | HR 70 | Ht 70.0 in | Wt 297.0 lb

## 2022-07-08 DIAGNOSIS — M5410 Radiculopathy, site unspecified: Secondary | ICD-10-CM

## 2022-07-08 DIAGNOSIS — M545 Low back pain, unspecified: Secondary | ICD-10-CM | POA: Diagnosis not present

## 2022-07-08 NOTE — Progress Notes (Signed)
New patient evaluation  Chief Complaint  Patient presents with   New Patient (Initial Visit)   Leg Pain    LT lower leg/ has fallen several times   Foot Pain    LT heel   46 year old male presents with an interesting constellation of symptoms  He complains of plantar heel pain on the right side for which she has had treatment which included imaging oral medication and topical medication without resolution  His review of systems reveals that he has had frequent falls including fall onto his left knee.  He has had some swelling of his left leg with pain in his heel which sometimes radiates from distal to proximal and involves pain of the lower leg  He also complains of some lower back pain without neurologic findings  Review of systems otherwise negative  Past Medical History:  Diagnosis Date   Hypertension     Past Surgical History:  Procedure Laterality Date   TONSILLECTOMY      Social History   Tobacco Use   Smoking status: Never   Smokeless tobacco: Never  Vaping Use   Vaping Use: Never used  Substance Use Topics   Alcohol use: Never   Drug use: Never    Family History  Problem Relation Age of Onset   Urticaria Maternal Grandmother    Food Allergy Maternal Grandmother    Allergic rhinitis Son    Allergic rhinitis Son     BP (!) 160/94   Pulse 70   Ht 5\' 10"  (1.778 m)   Wt 297 lb (134.7 kg)   BMI 42.62 kg/m   The patient meets the AMA guidelines for Morbid (severe) obesity with a BMI > 40.0 and I have recommended weight loss.  He is a well-developed well-nourished male no gross deformities  He appears to have some type of neurologic tremor  He has some pain in his lower back  His left lower extremity reveals tenderness over the fibula which is fractured by imaging see x-ray report  His knee is not swollen he has full range of motion of the left knee with no instability  His heel is tender on the plantar aspect the Achilles is normal.  He has mild  pes planus which is flexible.  His ankle range of motion is normal and stable  His x-ray was from an outside facility that showed a heel spur which is small  I imaged his spine in the office on the AP he has some abnormal alignment but it is minor does not meet the criteria for scoliosis and on the lateral view the disc spaces are open he has some mild facet joint narrowing  Impression Planter fasciitis   Recommend stretching, ice, plantar heel cups for 4 weeks and then if no improvement consider injection  His fractured fibula should heal normally  His low back pain seems to be nonsurgical not requiring any treatment  Return 4 weeks

## 2022-07-09 ENCOUNTER — Other Ambulatory Visit: Payer: Self-pay | Admitting: Cardiology

## 2022-07-09 NOTE — Telephone Encounter (Signed)
Rx(s) sent to pharmacy electronically.  

## 2022-07-19 ENCOUNTER — Ambulatory Visit: Payer: Medicare Other | Admitting: Rheumatology

## 2022-07-30 ENCOUNTER — Other Ambulatory Visit: Payer: Self-pay | Admitting: Cardiology

## 2022-08-05 ENCOUNTER — Encounter: Payer: Self-pay | Admitting: Orthopedic Surgery

## 2022-08-05 ENCOUNTER — Ambulatory Visit (INDEPENDENT_AMBULATORY_CARE_PROVIDER_SITE_OTHER): Payer: Medicare Other | Admitting: Orthopedic Surgery

## 2022-08-05 ENCOUNTER — Telehealth: Payer: Self-pay | Admitting: Radiology

## 2022-08-05 DIAGNOSIS — M722 Plantar fascial fibromatosis: Secondary | ICD-10-CM

## 2022-08-05 DIAGNOSIS — M545 Low back pain, unspecified: Secondary | ICD-10-CM | POA: Diagnosis not present

## 2022-08-05 NOTE — Progress Notes (Signed)
Chief Complaint  Patient presents with   Foot Problem    LEFT- has good days and bad days heel is better but still has pain in top of foot    Follow-up for Planter fasciitis.  Patient is doing well with the stretching ice night splint however is having some lateral foot pain and some lower back pain which she started some tramadol from primary care seems to be helping with his back pain but he still has this intermittent pain along the lateral border of his left foot and behind his knee  We will start him on some physical therapy for his back continue with the Planter fasciitis program and see him in 6 weeks  Encounter Diagnoses  Name Primary?   Lumbar pain Yes   Plantar fasciitis of left foot

## 2022-08-05 NOTE — Telephone Encounter (Signed)
Sent, I didn't know it was healthy blue, I thought was [employer]

## 2022-08-05 NOTE — Telephone Encounter (Signed)
Beth at [facility] called, they do not accept patients with [employer] as primary insurance.  You will need to send elsewhere.  They did call patient and advise.

## 2022-08-05 NOTE — Patient Instructions (Signed)
Physical therapy has been ordered for you at [facility] They should call you to schedule, [phone]  is the phone number to call, if you want to call to schedule.   

## 2022-08-07 ENCOUNTER — Other Ambulatory Visit: Payer: Self-pay | Admitting: Cardiology

## 2022-08-07 ENCOUNTER — Telehealth: Payer: Self-pay | Admitting: Cardiology

## 2022-08-07 MED ORDER — METOPROLOL TARTRATE 25 MG PO TABS: 12.5000 mg | ORAL_TABLET | Freq: Two times a day (BID) | ORAL | 0 refills | Status: DC

## 2022-08-07 NOTE — Telephone Encounter (Signed)
Rx request sent to pharmacy.  

## 2022-08-07 NOTE — Telephone Encounter (Signed)
*  STAT* If patient is at the pharmacy, call can be transferred to refill team.   1. Which medications need to be refilled? (please list name of each medication and dose if known) metoprolol tartrate (LOPRESSOR) 25 MG tablet  2. Which pharmacy/location (including street and city if local pharmacy) is medication to be sent to? [id number] - [place], [address] [place] [address]  3. Do they need a 30 day or 90 day supply? [place]

## 2022-08-30 ENCOUNTER — Ambulatory Visit (HOSPITAL_BASED_OUTPATIENT_CLINIC_OR_DEPARTMENT_OTHER): Payer: Medicare Other | Admitting: Cardiology

## 2022-08-30 NOTE — Progress Notes (Incomplete)
Cardiology Office Note:    Date:  08/30/2022   ID:  Randall Meadows, DOB 03/14/1976, MRN [id number]  PCP:  Randall Dy, MD  Cardiologist:  Buford Dresser, MD  Referring MD: Randall Dy, MD   CC: follow up   History of Present Illness:    Randall Meadows is a 46 y.o. male with a hx of hypertension who is seen for follow-up. I initially met him 09/18/2020 as a new consult at the request of Randall Dy, MD for the evaluation and management of chest pain.  CV history: intermittent chest pain >1 year. ER evaluation unremarkable. CT coronary with no calcium, no plaque. Echo normal with only grade 1 DD.  Patient was last seen by me on 06/06/2021 for persistent chest pain/tightness and shortness of breath. His symptoms occurred both at rest and with activity, suddenly typically without warning. We reviewed his testing at length at that time, which was very reassuring from a cardiac perspective. Discussed non-cardiac causes of chest pain. He noted accompanying bilateral LE edema, primarily in his left LE. 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 Improved with elevated his legs. While on Flexeril (as needed), he sometimes noticed improvement in his chest pain. He also noted that he felt better since starting Metoprolol. When he was given Nitroglycerin previously, he felt a lot of pressure dissipating over his heart. His PCP suspected his symptoms may have been secondary to his reflux.  Today, ***  The patient denies chest pain, chest pressure, dyspnea at rest or with exertion, PND, orthopnea, or leg swelling. Denies cough, fever, chills, nausea, or vomiting. Denies syncope, presyncope, or snoring. Denies dizziness or lightheadedness.    Past Medical History:  Diagnosis Date   Hypertension     Past Surgical History:  Procedure Laterality Date   TONSILLECTOMY      Current Medications: Current Outpatient Medications on File Prior to  Visit  Medication Sig   albuterol (VENTOLIN HFA) 108 (90 Base) MCG/ACT inhaler Inhale 1-2 puffs into the lungs every 4 (four) hours as needed for wheezing or shortness of breath (cough, shortness of breath or wheezing.).   budesonide-formoterol (SYMBICORT) 80-4.5 MCG/ACT inhaler Inhale 2 puffs into the lungs in the morning and at bedtime.   cyclobenzaprine (FLEXERIL) 10 MG tablet Take 10 mg by mouth 2 (two) times daily as needed for muscle spasms.   diclofenac (VOLTAREN) 75 MG EC tablet Take 75 mg by mouth 2 (two) times daily.   diclofenac Sodium (VOLTAREN) 1 % GEL Apply 4 g topically 4 (four) times daily.   fluticasone (FLONASE) 50 MCG/ACT nasal spray Place 1 spray into both nostrils daily.   lisinopril-hydrochlorothiazide (ZESTORETIC) 20-25 MG tablet Take 1 tablet by mouth daily.   LORazepam (ATIVAN) 0.5 MG tablet Take 0.5 mg by mouth at bedtime as needed for anxiety.   meclizine (ANTIVERT) 25 MG tablet Take 12.5 mg by mouth 4 (four) times daily.   metoprolol tartrate (LOPRESSOR) 25 MG tablet Take 0.5 tablets (12.5 mg total) by mouth 2 (two) times daily. Please keep your upcoming appointment for refills.   rosuvastatin (CRESTOR) 10 MG tablet Take 10 mg by mouth daily.   sertraline (ZOLOFT) 50 MG tablet Take 50 mg by mouth daily.   traMADol (ULTRAM) 50 MG tablet Take 50 mg by mouth 4 (four) times daily.   No current facility-administered medications on file prior to visit.     Allergies:   Patient has no known allergies.   Social  History   Tobacco Use   Smoking status: Never   Smokeless tobacco: Never  Vaping Use   Vaping Use: Never used  Substance Use Topics   Alcohol use: Never   Drug use: Never    Family History: Mother has heart issues, mat Gma has heart issues, mat great Gma also with heart issues.  ROS:   Please see the history of present illness. *** All other systems are reviewed and negative.   EKGs/Labs/Other Studies Reviewed:    The following studies were  reviewed today:  Echo 04/25/2021:  1. Left ventricular ejection fraction, by estimation, is 65 to 70%. The  left ventricle has normal function. The left ventricle has no regional  wall motion abnormalities. Left ventricular diastolic parameters are  consistent with Grade I diastolic  dysfunction (impaired relaxation).   2. Right ventricular systolic function is normal. The right ventricular  size is normal. Tricuspid regurgitation signal is inadequate for assessing  PA pressure.   3. The mitral valve is grossly normal. No evidence of mitral valve  regurgitation.   4. The aortic valve is tricuspid. Aortic valve regurgitation is not  visualized.   Comparison(s): No prior Echocardiogram.  CT Coronary 01/15/2021: IMPRESSION: 1. No evidence of CAD, CADRADS = 0. 2. Coronary calcium score of 0. 3. Normal coronary origin with co-dominance.  EKG:  EKG is personally reviewed.   06/06/2021: Sinus bradycardia at 53 bpm 09/18/2020: NSR at 85 bpm  Recent Labs: No results found for requested labs within last 365 days.  Recent Lipid Panel No results found for: "CHOL", "TRIG", "HDL", "CHOLHDL", "VLDL", "LDLCALC", "LDLDIRECT"  Physical Exam:    VS:  There were no vitals taken for this visit.    Wt Readings from Last 3 Encounters:  07/08/22 297 lb (134.7 kg)  06/21/22 297 lb 3.2 oz (134.8 kg)  01/11/22 299 lb (135.6 kg)    GEN: Well nourished, well developed in no acute distress HEENT: Normal, moist mucous membranes NECK: No JVD CARDIAC: regular rhythm, normal S1 and S2, no rubs or gallops. No murmurs. VASCULAR: Radial and DP pulses 2+ bilaterally. No carotid bruits RESPIRATORY:  Clear to auscultation without rales, wheezing or rhonchi  ABDOMEN: Soft, non-tender, non-distended MUSCULOSKELETAL:  Ambulates independently SKIN: Warm and dry, no edema NEUROLOGIC:  Alert and oriented x 3. No focal neuro deficits noted. PSYCHIATRIC:  Normal affect    ASSESSMENT:    No diagnosis  found.   PLAN:    Chest pain, noncardiac: -we reviewed his cardiac workup at length, which is very reassuring. We also discussed non-cardiac causes of chest pain. I recommend he continue to follow up with his PCP on this, but no further cardiac testing needed.  -counseled on red flag warning signs that need immediate medical attention  Hypertension: at goal -continue lisinopril-HCTZ  Family history of heart diease: Cardiac risk counseling and prevention recommendations: -recommend heart healthy/Mediterranean diet, with whole grains, fruits, vegetable, fish, lean meats, nuts, and olive oil. Limit salt. -recommend moderate walking, 3-5 times/week for 30-50 minutes each session. Aim for at least 150 minutes.week. Goal should be pace of 3 miles/hours, or walking 1.5 miles in 30 minutes -recommend avoidance of tobacco products. Avoid excess alcohol.  Plan for follow up: ***  Buford Dresser, MD, PhD [place]  [facility]    Medication Adjustments/Labs and Tests Ordered: Current medicines are reviewed at length with the patient today.  Concerns regarding medicines are outlined above.   No orders of the defined types were placed in  this encounter.   No orders of the defined types were placed in this encounter.   There are no Patient Instructions on file for this visit.    I,Alexis Herring,acting as a Education administrator for [employer], MD.,have documented all relevant documentation on the behalf of Buford Dresser, MD,as directed by  Buford Dresser, MD while in the presence of Buford Dresser, MD.  ***  Signed, Buford Dresser, MD PhD 08/30/2022     [place]

## 2022-09-06 ENCOUNTER — Ambulatory Visit (HOSPITAL_COMMUNITY): Payer: Medicare Other

## 2022-09-13 ENCOUNTER — Ambulatory Visit (INDEPENDENT_AMBULATORY_CARE_PROVIDER_SITE_OTHER): Payer: Medicare Other | Admitting: Orthopedic Surgery

## 2022-09-13 ENCOUNTER — Encounter: Payer: Self-pay | Admitting: Orthopedic Surgery

## 2022-09-13 DIAGNOSIS — M5410 Radiculopathy, site unspecified: Secondary | ICD-10-CM

## 2022-09-13 DIAGNOSIS — M545 Low back pain, unspecified: Secondary | ICD-10-CM | POA: Diagnosis not present

## 2022-09-13 MED ORDER — GABAPENTIN 100 MG PO CAPS: 100.0000 mg | ORAL_CAPSULE | Freq: Three times a day (TID) | ORAL | 2 refills | Status: DC

## 2022-09-13 NOTE — Progress Notes (Signed)
FOLLOW UP   Encounter Diagnoses  Name Primary?   Lumbar pain    Radicular leg pain Yes     Chief Complaint  Patient presents with   Foot Pain    Left/ some days has difficulty ambulating due to pain     Randall Meadows is having some continued difficulties with his foot but overall has better bad good days than bad days  He is continue to have issues with his left lower extremity in terms of his back and radicular symptoms but he has not started physical therapy  I will add some gabapentin to help with his neurologic symptoms and continue his heel cushions ice and stretching for his plantar fasciitis  Encounter Diagnoses  Name Primary?   Lumbar pain    Radicular leg pain Yes     Meds ordered this encounter  Medications   gabapentin (NEURONTIN) 100 MG capsule    Sig: Take 1 capsule (100 mg total) by mouth 3 (three) times daily.    Dispense:  90 capsule    Refill:  2   Starts therapy next month for the back   Return 6 weeks

## 2022-09-13 NOTE — Patient Instructions (Signed)
Continue your exercises for the heel  Start therapy for the back pain   Start this for the pain on the top of the foot  Meds ordered this encounter  Medications   gabapentin (NEURONTIN) 100 MG capsule    Sig: Take 1 capsule (100 mg total) by mouth 3 (three) times daily.    Dispense:  90 capsule    Refill:  2

## 2022-09-30 NOTE — Therapy (Signed)
OUTPATIENT PHYSICAL THERAPY THORACOLUMBAR EVALUATION   Patient Name: Randall Meadows MRN: [id number] DOB:July 24, 1976, 46 y.o., male Today's Date: 10/04/2022  END OF SESSION:  PT End of Session - 10/04/22 0825     Visit Number 1    Number of Visits 7    Date for PT Re-Evaluation 11/01/22    Authorization Type Medicaid [employer]    Authorization Time Period auth required please check    PT Start Time 0825    PT Stop Time 0900    PT Time Calculation (min) 35 min             Past Medical History:  Diagnosis Date   Hypertension    Past Surgical History:  Procedure Laterality Date   TONSILLECTOMY     Patient Active Problem List   Diagnosis Date Noted   ANXIETY 04/03/2009   OBESITY 09/07/2007   HYPERTENSION 09/07/2007   ALLERGIC RHINITIS 09/07/2007   HEADACHE 09/07/2007   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/2007    PCP: Burton Apley MD, PCP  REFERRING PROVIDER:  Vickki Hearing, MD [facility] [place]    REFERRING DIAG:  Diagnosis  M54.50 (ICD-10-CM) - Lumbar pain    Rationale for Evaluation and Treatment: Rehabilitation  THERAPY DIAG:  Low back pain, unspecified back pain laterality, unspecified chronicity, unspecified whether sciatica present  Other symptoms and signs involving the musculoskeletal system  ONSET DATE: about a month  SUBJECTIVE:                                                                                                                                                                                           SUBJECTIVE STATEMENT: Patient reports insidious onset of back pain; mid to low back area; also having left foot pain; saw Dr. Romeo Apple and he did an x-ray of his low back; showed arthritis  PERTINENT HISTORY:  Bone spur in left foot; small fracture in left lower leg (fibula) per patient diagnosed 3 months ago per x-ray Having oral surgery on Monday 10/07/22 PAIN:   Are you having pain? Yes: NPRS scale: 3/10 Pain location: low back Pain description: sore, aching Aggravating factors: bending over, prolonged sitting Relieving factors: rest, lying down, back supported  PRECAUTIONS: Fall  WEIGHT BEARING RESTRICTIONS: No  FALLS:  Has patient fallen in last 6 months? Yes. Number of falls 6  LIVING ENVIRONMENT: Lives with: lives with their family Lives in: House/apartment Stairs: No Has following equipment at home: Single point cane  OCCUPATION: disabled  PLOF: Needs assistance with ADLs occassionally  PATIENT GOALS: get my pain manageable  NEXT MD VISIT:  2 more weeks  OBJECTIVE:   DIAGNOSTIC FINDINGS:  Spinal imaging   Left lower back pain with some lower leg pain does not appear to be radicular patient has had frequent falls   X-rays show slight scoliosis in the lumbar L4-5 S1 area with increased lumbar lordosis disc spaces are open and preserved.  Facet joints show mild degenerative changes.   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    PATIENT SURVEYS:  FOTO 45   COGNITION: Overall cognitive status: Within functional limits for tasks assessed      POSTURE: rounded shoulders, forward head, and decreased lumbar lordosis  PALPATION: Tender low back paraspinals  LUMBAR ROM:   AROM eval  Flexion 60% available *  Extension 40% available  Right lateral flexion   Left lateral flexion   Right rotation   Left rotation    (Blank rows = not tested)   LOWER EXTREMITY MMT:    MMT Right eval Left eval  Hip flexion 4 4  Hip extension 3- 3-  Hip abduction    Hip adduction    Hip internal rotation    Hip external rotation    Knee flexion 4 4  Knee extension 5 3   Ankle dorsiflexion 5 5  Ankle plantarflexion    Ankle inversion    Ankle eversion     (Blank rows = not tested)  L FUNCTIONAL TESTS:  5 times sit to stand: 20.74   TODAY'S TREATMENT:                                                                                                                               DATE: 10/03/22 physical therapy evaluation and HEP instruction    PATIENT EDUCATION:  Education details: Patient educated on exam findings, POC, scope of PT, HEP. Person educated: Patient Education method: Explanation, Demonstration, and Handouts Education comprehension: verbalized understanding, returned demonstration, verbal cues required, and tactile cues required   HOME EXERCISE PROGRAM: Access Code: [email or web address] URL: [email or web address] Date: 10/04/2022 Prepared by: [facility] - Rehab  Exercises - Supine Lower Trunk Rotation  - 2-3 x daily - 7 x weekly - 1 sets - 10 reps - Supine Transversus Abdominis Bracing - Hands on Stomach  - 2-3 x daily - 7 x weekly - 1 sets - 10 reps - Supine Bridge  - 2-3 x daily - 7 x weekly - 1 sets - 10 reps  ASSESSMENT:  CLINICAL IMPRESSION: Patient is a 46 y.o. male who was seen today for physical therapy evaluation and treatment for low back pain. Patient demonstrates muscle weakness, reduced ROM, and fascial restrictions which are likely contributing to symptoms of pain and are negatively impacting patient ability to perform ADLs and functional mobility tasks. Patient will benefit from skilled physical therapy services to address these deficits to reduce pain and improve level of function with ADLs and functional mobility tasks.   OBJECTIVE IMPAIRMENTS: decreased activity tolerance, decreased balance, decreased endurance, decreased knowledge  of condition, decreased mobility, difficulty walking, decreased ROM, decreased strength, hypomobility, increased fascial restrictions, impaired perceived functional ability, increased muscle spasms, impaired flexibility, obesity, and pain.   ACTIVITY LIMITATIONS: carrying, lifting, bending, sitting, standing, squatting, transfers, and locomotion level  PARTICIPATION LIMITATIONS: meal prep, cleaning, laundry, shopping, community  activity, and yard work  [facility] POTENTIAL: Good  CLINICAL DECISION MAKING: Stable/uncomplicated  EVALUATION COMPLEXITY: Low   GOALS: Goals reviewed with patient? No  SHORT TERM GOALS: Target date: 10/18/2022  Patient will be independent with initial HEP  Baseline: Goal status: INITIAL  2.   Patient will report at least 50% improvement in overall symptoms and/or function to demonstrate improved functional mobility  Baseline:  Goal status: INITIAL    LONG TERM GOALS: Target date: 11/01/2022  Patient will be independent in self management strategies to improve quality of life and functional outcomes.  Baseline:  Goal status: INITIAL  2.   Patient will report at least 75% improvement in overall symptoms and/or function to demonstrate improved functional mobility  Baseline:  Goal status: INITIAL  3.  Patient will increase  leg MMTs to 5/5 without pain to promote return to ambulation community distances with minimal deviation.    Baseline: see above Goal status: INITIAL  4.  Patient will improve FOTO score to predicted value  Baseline: 45 Goal status: INITIAL  5.  Patient will improve 5 times sit to stand score from 20.74 sec to 16 sec to demonstrate improved functional mobility and increased lower extremity strength.  Baseline:  Goal status: INITIAL   PLAN:  PT FREQUENCY: 1-2x/week  PT DURATION: 4 weeks  PLANNED INTERVENTIONS:  Therapeutic exercises, Therapeutic activity, Neuromuscular re-education, Balance training, Gait training, Patient/Family education, Joint manipulation, Joint mobilization, Stair training, Orthotic/Fit training, DME instructions, Aquatic Therapy, Dry Needling, Electrical stimulation, Spinal manipulation, Spinal mobilization, Cryotherapy, Moist heat, Compression bandaging, scar mobilization, Splintting, Taping, Traction, Ultrasound, Ionotophoresis 4mg /ml Dexamethasone, and Manual therapy .  PLAN FOR NEXT SESSION: Review HEP and set rehab  goals; progress lumbar mobility and core strengthening as able    9:22 AM, 10/04/22 Mikki Ziff [place] MPT [place] [facility] [place] [phone] Ph:[phone]

## 2022-10-01 ENCOUNTER — Ambulatory Visit (HOSPITAL_BASED_OUTPATIENT_CLINIC_OR_DEPARTMENT_OTHER): Payer: Medicare Other | Admitting: Cardiology

## 2022-10-02 ENCOUNTER — Ambulatory Visit (HOSPITAL_COMMUNITY): Payer: Medicare Other | Admitting: Physical Therapy

## 2022-10-04 ENCOUNTER — Ambulatory Visit (INDEPENDENT_AMBULATORY_CARE_PROVIDER_SITE_OTHER): Payer: Medicare Other | Admitting: Family Medicine

## 2022-10-04 ENCOUNTER — Other Ambulatory Visit: Payer: Self-pay

## 2022-10-04 ENCOUNTER — Ambulatory Visit (HOSPITAL_COMMUNITY): Payer: Medicare Other | Attending: Orthopedic Surgery

## 2022-10-04 ENCOUNTER — Encounter: Payer: Self-pay | Admitting: Family Medicine

## 2022-10-04 VITALS — BP 116/72 | HR 94 | Temp 98.2°F | Resp 16

## 2022-10-04 DIAGNOSIS — R29898 Other symptoms and signs involving the musculoskeletal system: Secondary | ICD-10-CM | POA: Diagnosis present

## 2022-10-04 DIAGNOSIS — M545 Low back pain, unspecified: Secondary | ICD-10-CM | POA: Diagnosis present

## 2022-10-04 DIAGNOSIS — H1013 Acute atopic conjunctivitis, bilateral: Secondary | ICD-10-CM

## 2022-10-04 DIAGNOSIS — H1010 Acute atopic conjunctivitis, unspecified eye: Secondary | ICD-10-CM | POA: Insufficient documentation

## 2022-10-04 DIAGNOSIS — J4530 Mild persistent asthma, uncomplicated: Secondary | ICD-10-CM | POA: Diagnosis not present

## 2022-10-04 DIAGNOSIS — J301 Allergic rhinitis due to pollen: Secondary | ICD-10-CM | POA: Diagnosis not present

## 2022-10-04 MED ORDER — CETIRIZINE HCL 10 MG PO TABS: 10.0000 mg | ORAL_TABLET | Freq: Every day | ORAL | 5 refills | Status: DC | PRN

## 2022-10-04 NOTE — Patient Instructions (Addendum)
Asthma Continue Symbicort 80-2 puffs once a day with a spacer to prevent cough or wheeze Continue albuterol 2 puffs once every 4 hours as needed for cough or wheeze You may use albuterol 2 puffs 5 to 15 minutes before activity to decrease cough or wheeze For asthma flare, increase Symbicort 80 to 2 puffs twice a day for 2 weeks or until cough and wheeze free, then return to original dosing  Allergic rhinitis Continue allergen avoidance measures directed toward grass pollen, weed pollen, ragweed pollen, and tree pollen as listed below Continue cetirizine 10 mg once a day as needed for runny nose or itch Continue Flonase 2 sprays in each nostril once a day as needed for a stuffy nose Consider saline nasal rinses as needed for nasal symptoms. Use this before any medicated nasal sprays for best result  Allergic conjunctivitis Some over the counter eye drops include Pataday one drop in each eye once a day as needed for red, itchy eyes OR Zaditor one drop in each eye twice a day as needed for red itchy eyes. Call the clinic if this treatment plan is not working well for you  Follow up in 6 months or sooner if needed.  Reducing Pollen Exposure The American Academy of Allergy, Asthma and Immunology suggests the following steps to reduce your exposure to pollen during allergy seasons. Do not hang sheets or clothing out to dry; pollen may collect on these items. Do not mow lawns or spend time around freshly cut grass; mowing stirs up pollen. Keep windows closed at night.  Keep car windows closed while driving. Minimize morning activities outdoors, a time when pollen counts are usually at their highest. Stay indoors as much as possible when pollen counts or humidity is high and on windy days when pollen tends to remain in the air longer. Use air conditioning when possible.  Many air conditioners have filters that trap the pollen spores. Use a HEPA room air filter to remove pollen form the indoor air  you breathe.

## 2022-10-04 NOTE — Progress Notes (Signed)
[address] [place] [place] [address] Dept: [phone]  FOLLOW UP NOTE  Patient ID: Randall Meadows, male    DOB: [birth year]  Age: 46 y.o. MRN: [id number] Date of Office Visit: 10/04/2022  Assessment  Chief Complaint: Follow-up  HPI Randall Meadows is a 46 year old male who presents to the clinic for a follow up visit. He was last seen in this clinic on 06/21/2022 for evaluation of asthma and allergic rhinitis. At today's visit, he reports his asthma has been well controlled with no shortness of breath, cough or wheeze with activity or rest. He continues Symbicort 80-2 puffs once a day and has not used albuterol for the last 3 months. Allergic rhinitis is reported as moderately well controlled with symptoms including clear rhinorrhea, nasal congestion and sneezing occurring mainly during the spring and fall seasons. He continues cetirizine once a day and uses Flonase as needed. He reports that he is experiencing pain in his leg and back and was not able to do any yard work over the summer and he noted a significant improvement in has allergy symptoms. Allergic conjunctivitis is reported as moderately well controlled with occasional red and itchy eyes for which he is not using any medical intervention. His current medications are listed in the chart.    Drug Allergies:  No Known Allergies  Physical Exam: BP 116/72 (BP Location: Left Arm, Patient Position: Sitting, Cuff Size: Large)   Pulse 94   Temp 98.2 F (36.8 C) (Temporal)   Resp 16   SpO2 95%    Physical Exam Vitals reviewed.  Constitutional:      Appearance: Normal appearance.  HENT:     Head: Normocephalic and atraumatic.     Right Ear: Tympanic membrane normal.     Left Ear: Tympanic membrane normal.     Nose:     Comments: Bilateral nares slightly erythematous with clear nasal drainage. Pharynx normal. Ears normal. Eyes normal.    Mouth/Throat:     Pharynx: Oropharynx is clear.  Eyes:      Conjunctiva/sclera: Conjunctivae normal.  Cardiovascular:     Rate and Rhythm: Normal rate and regular rhythm.     Heart sounds: Normal heart sounds. No murmur heard. Pulmonary:     Effort: Pulmonary effort is normal.     Breath sounds: Normal breath sounds.     Comments: Lungs clear to auscultaiton Musculoskeletal:        General: Normal range of motion.     Cervical back: Normal range of motion and neck supple.  Skin:    General: Skin is warm and dry.  Neurological:     Mental Status: He is alert and oriented to person, place, and time.  Psychiatric:        Mood and Affect: Mood normal.        Behavior: Behavior normal.        Thought Content: Thought content normal.        Judgment: Judgment normal.     Diagnostics: FVC 3.26, FEV1 2.61. Predicted FVC 4.25, predicted FEV1 3.39. Spirometry indicates normal ventilatory function.  Assessment and Plan: 1. Mild persistent asthma, uncomplicated   2. Seasonal allergic rhinitis due to pollen   3. Seasonal allergic conjunctivitis     Meds ordered this encounter  Medications   cetirizine (ZYRTEC) 10 MG tablet    Sig: Take 1 tablet (10 mg total) by mouth daily as needed for allergies.    Dispense:  30 tablet  Refill:  5    Patient Instructions  Asthma Continue Symbicort 80-2 puffs once a day with a spacer to prevent cough or wheeze Continue albuterol 2 puffs once every 4 hours as needed for cough or wheeze You may use albuterol 2 puffs 5 to 15 minutes before activity to decrease cough or wheeze For asthma flare, increase Symbicort 80 to 2 puffs twice a day for 2 weeks or until cough and wheeze free, then return to original dosing  Allergic rhinitis Continue allergen avoidance measures directed toward grass pollen, weed pollen, ragweed pollen, and tree pollen as listed below Continue cetirizine 10 mg once a day as needed for runny nose or itch Continue Flonase 2 sprays in each nostril once a day as needed for a stuffy  nose Consider saline nasal rinses as needed for nasal symptoms. Use this before any medicated nasal sprays for best result  Allergic conjunctivitis Some over the counter eye drops include Pataday one drop in each eye once a day as needed for red, itchy eyes OR Zaditor one drop in each eye twice a day as needed for red itchy eyes. Call the clinic if this treatment plan is not working well for you  Follow up in 6 months or sooner if needed.   Return in about 6 months (around 04/05/2023), or if symptoms worsen or fail to improve.    Thank you for the opportunity to care for this patient.  Please do not hesitate to contact me with questions.  Gareth Morgan, FNP [facility] [place]

## 2022-10-07 NOTE — Progress Notes (Signed)
Cardiology Office Note:    Date:  06/06/2021   ID:  Randall Meadows, DOB [birth year], MRN [id number]  PCP:  Lorene Dy, MD  Cardiologist:  Buford Dresser, MD  Referring MD: Lorene Dy, MD   CC: follow up   History of Present Illness:    Randall Meadows is a 46 y.o. male with a hx of hypertension who is seen for follow-up. I initially met him 09/18/2020 as a new consult at the request of Lorene Dy, MD for the evaluation and management of chest pain.  CV history: intermittent chest pain >1 year. ER evaluation unremarkable. CT coronary with no calcium, no plaque. Echo normal with only grade 1 DD.  At his last visit, he complained of constant chest pain/tightness and shortness of breath. This was sudden and occurred at rest or activity. His chest pain sometimes improved with prn flexeril and nitroglycerin, and improved after starting metoprolol. Recommended he continue to follow up with his PCP; no further cardiac testing needed.   Today,  He denies any palpitations, chest pain, shortness of breath, or peripheral edema. No lightheadedness, headaches, syncope, orthopnea, or PND.  (+)  Past Medical History:  Diagnosis Date   Hypertension     History reviewed. No pertinent surgical history.  Current Medications: Current Outpatient Medications on File Prior to Visit  Medication Sig   cyclobenzaprine (FLEXERIL) 10 MG tablet Take 10 mg by mouth 2 (two) times daily as needed for muscle spasms.   lisinopril-hydrochlorothiazide (ZESTORETIC) 20-25 MG tablet Take 1 tablet by mouth daily.   LORazepam (ATIVAN) 0.5 MG tablet Take 0.5 mg by mouth at bedtime as needed for anxiety.   metoprolol tartrate (LOPRESSOR) 25 MG tablet Take 0.5 tablets (12.5 mg total) by mouth 2 (two) times daily.   sertraline (ZOLOFT) 50 MG tablet Take 50 mg by mouth daily.   No current facility-administered medications on file prior to visit.     Allergies:   Patient has no known  allergies.   Social History   Tobacco Use   Smoking status: Never   Smokeless tobacco: Never  Vaping Use   Vaping Use: Never used  Substance Use Topics   Alcohol use: Never   Drug use: Never    Family History: Mother has heart issues, mat Gma has heart issues, mat great Gma also with heart issues.  ROS:   Please see the history of present illness.  All other systems are reviewed and negative.   EKGs/Labs/Other Studies Reviewed:    The following studies were reviewed today:  Echo 04/25/2021:  1. Left ventricular ejection fraction, by estimation, is 65 to 70%. The  left ventricle has normal function. The left ventricle has no regional  wall motion abnormalities. Left ventricular diastolic parameters are  consistent with Grade I diastolic  dysfunction (impaired relaxation).   2. Right ventricular systolic function is normal. The right ventricular  size is normal. Tricuspid regurgitation signal is inadequate for assessing  PA pressure.   3. The mitral valve is grossly normal. No evidence of mitral valve  regurgitation.   4. The aortic valve is tricuspid. Aortic valve regurgitation is not  visualized.   Comparison(s): No prior Echocardiogram.  CT Coronary 01/15/2021: IMPRESSION: 1. No evidence of CAD, CADRADS = 0. 2. Coronary calcium score of 0. 3. Normal coronary origin with co-dominance.  EKG:  EKG is personally reviewed.   10/08/2022:  Sinus ***. Rate *** bpm. 06/06/2021: Sinus bradycardia at 53 bpm 09/18/2020: NSR at 85 bpm  Recent Labs:  01/31/2021: ALT 27 02/15/2021: BUN 15; Creatinine, Ser 1.07; Hemoglobin 15.8; Platelets 270; Potassium 4.0; Sodium 136   Recent Lipid Panel No results found for: CHOL, TRIG, HDL, CHOLHDL, VLDL, LDLCALC, LDLDIRECT  Physical Exam:    VS:  BP 118/74   Pulse (!) 53   Ht _0  (1.753 m)   Wt 291 lb 6.4 oz (132.2 kg)   SpO2 97%   BMI 43.03 kg/m     Wt Readings from Last 3 Encounters:  06/06/21 291 lb 6.4 oz (132.2 kg)   03/05/21 292 lb 12.8 oz (132.8 kg)  01/31/21 275 lb (124.7 kg)    GEN: Well nourished, well developed in no acute distress HEENT: Normal, moist mucous membranes NECK: No JVD CARDIAC: regular rhythm, normal S1 and S2, no rubs or gallops. No murmurs. VASCULAR: Radial and DP pulses 2+ bilaterally. No carotid bruits RESPIRATORY:  Clear to auscultation without rales, wheezing or rhonchi  ABDOMEN: Soft, non-tender, non-distended MUSCULOSKELETAL:  Ambulates independently SKIN: Warm and dry, no edema NEUROLOGIC:  Alert and oriented x 3. No focal neuro deficits noted. PSYCHIATRIC:  Normal affect    ASSESSMENT:    1. Chest pain of uncertain etiology   2. Encounter to discuss test results   3. Nutritional counseling   4. Exercise counseling   5. Cardiac risk counseling   6. Counseling on health promotion and disease prevention   7. Family history of heart disease   8. Essential hypertension     PLAN:    Chest pain, noncardiac -we reviewed his cardiac workup at length, which is very reassuring. We also discussed non-cardiac causes of chest pain. I recommend he continue to follow up with his PCP on this, but no further cardiac testing needed.  -counseled on red flag warning signs that need immediate medical attention  Hypertension: at goal -continue lisinopril-HCTZ  Family history of heart diease: Cardiac risk counseling and prevention recommendations: -recommend heart healthy/Mediterranean diet, with whole grains, fruits, vegetable, fish, lean meats, nuts, and olive oil. Limit salt. -recommend moderate walking, 3-5 times/week for 30-50 minutes each session. Aim for at least 150 minutes.week. Goal should be pace of 3 miles/hours, or walking 1.5 miles in 30 minutes -recommend avoidance of tobacco products. Avoid excess alcohol.  Plan for follow up: ***I would be happy to see him back as needed, but with his reassuring workup he does not need routine cardiology follow up  Buford Dresser, MD, PhD [place]  [facility]    Medication Adjustments/Labs and Tests Ordered: Current medicines are reviewed at length with the patient today.  Concerns regarding medicines are outlined above.   Orders Placed This Encounter  Procedures   EKG 12-Lead    No orders of the defined types were placed in this encounter.  Patient Instructions  Medication Instructions:  Your Physician recommend you continue on your current medication as directed.    *If you need a refill on your cardiac medications before your next appointment, please call your pharmacy*   Lab Work: None ordered today   Testing/Procedures: None ordered today   Follow-Up: At [facility], you and your health needs are our priority.  As part of our continuing mission to provide you with exceptional heart care, we have created designated Provider Care Teams.  These Care Teams include your primary Cardiologist (physician) and Advanced Practice Providers (APPs -  Physician Assistants and Nurse Practitioners) who all work together to provide you with the care you need, when you need it.  We recommend  signing up for the patient portal called "MyChart".  Sign up information is provided on this After Visit Summary.  MyChart is used to connect with patients for Virtual Visits (Telemedicine).  Patients are able to view lab/test results, encounter notes, upcoming appointments, etc.  Non-urgent messages can be sent to your provider as well.   To learn more about what you can do with MyChart, go to [email or web address].    Your next appointment:   As needed  The format for your next appointment:   In Person  Provider:   Buford Dresser, MD     [facility],acting as a scribe for Buford Dresser, MD.,have documented all relevant documentation on the behalf of Buford Dresser, MD,as directed by  Buford Dresser, MD while in the presence of Buford Dresser, MD.  I,  Buford Dresser, MD, have reviewed all documentation for this visit. The documentation on 06/06/21 for the exam, diagnosis, procedures, and orders are all accurate and complete.   Signed, Buford Dresser, MD PhD 06/06/2021     [place]

## 2022-10-08 ENCOUNTER — Encounter (HOSPITAL_BASED_OUTPATIENT_CLINIC_OR_DEPARTMENT_OTHER): Payer: Self-pay | Admitting: Cardiology

## 2022-10-08 ENCOUNTER — Ambulatory Visit (INDEPENDENT_AMBULATORY_CARE_PROVIDER_SITE_OTHER): Payer: Medicare Other | Admitting: Cardiology

## 2022-10-08 VITALS — BP 126/90 | HR 79 | Ht 70.0 in | Wt 310.6 lb

## 2022-10-08 DIAGNOSIS — Z7189 Other specified counseling: Secondary | ICD-10-CM

## 2022-10-08 DIAGNOSIS — I10 Essential (primary) hypertension: Secondary | ICD-10-CM

## 2022-10-08 DIAGNOSIS — R079 Chest pain, unspecified: Secondary | ICD-10-CM

## 2022-10-08 DIAGNOSIS — Z8249 Family history of ischemic heart disease and other diseases of the circulatory system: Secondary | ICD-10-CM

## 2022-10-08 NOTE — Patient Instructions (Signed)
Medication Instructions:  Your Physician recommend you continue on your current medication as directed.    *If you need a refill on your cardiac medications before your next appointment, please call your pharmacy*  Follow-Up: At  [facility], you and your health needs are our priority.  As part of our continuing mission to provide you with exceptional heart care, we have created designated Provider Care Teams.  These Care Teams include your primary Cardiologist (physician) and Advanced Practice Providers (APPs -  Physician Assistants and Nurse Practitioners) who all work together to provide you with the care you need, when you need it.  We recommend signing up for the patient portal called "MyChart".  Sign up information is provided on this After Visit Summary.  MyChart is used to connect with patients for Virtual Visits (Telemedicine).  Patients are able to view lab/test results, encounter notes, upcoming appointments, etc.  Non-urgent messages can be sent to your provider as well.   To learn more about what you can do with MyChart, go to [email or web address].    Your next appointment:   Call us if you need us 

## 2022-10-09 ENCOUNTER — Encounter (HOSPITAL_BASED_OUTPATIENT_CLINIC_OR_DEPARTMENT_OTHER): Payer: Self-pay | Admitting: Cardiology

## 2022-10-10 ENCOUNTER — Telehealth (HOSPITAL_COMMUNITY): Payer: Self-pay

## 2022-10-10 ENCOUNTER — Encounter (HOSPITAL_COMMUNITY): Payer: Medicare Other

## 2022-10-10 NOTE — Telephone Encounter (Signed)
No show, called and spoke to wife who stated Randall Meadows is having oral surgery today and unable to make it to apt today.  Reminded next apt date and time and discussed location of therapy as we are moving back to outpatient dept. Assured they have contact information if need to cancel/reschedule in the future.    Becky Sax, LPTA/CLT; [place] [phone]

## 2022-10-16 ENCOUNTER — Ambulatory Visit (HOSPITAL_COMMUNITY): Payer: Medicare Other | Admitting: Physical Therapy

## 2022-10-22 ENCOUNTER — Encounter (HOSPITAL_COMMUNITY): Payer: Medicare Other

## 2022-10-25 ENCOUNTER — Ambulatory Visit (INDEPENDENT_AMBULATORY_CARE_PROVIDER_SITE_OTHER): Payer: Medicare Other | Admitting: Orthopedic Surgery

## 2022-10-25 ENCOUNTER — Encounter: Payer: Self-pay | Admitting: Orthopedic Surgery

## 2022-10-25 DIAGNOSIS — M722 Plantar fascial fibromatosis: Secondary | ICD-10-CM | POA: Diagnosis not present

## 2022-10-25 DIAGNOSIS — M545 Low back pain, unspecified: Secondary | ICD-10-CM | POA: Diagnosis not present

## 2022-10-25 NOTE — Progress Notes (Signed)
FOLLOW UP   Encounter Diagnoses  Name Primary?   Lumbar pain Yes   Plantar fasciitis of left foot      Chief Complaint  Patient presents with   Back Pain    Has had to RS her therapy appointments       Physical therapy for the spine issue has been delayed because of issues with the patient's oral cavity and surgery he did go to 1 visit and got home exercises which she does sparingly.  His back pain is not change it is moderate at times but mainly mild  The heel issue he has improved with exercise ice and heel cups as well as the gabapentin  Recommend physical therapy for the back continue current treatment for the he will follow-up in February

## 2022-10-28 ENCOUNTER — Other Ambulatory Visit: Payer: Self-pay | Admitting: Cardiology

## 2022-10-29 NOTE — Telephone Encounter (Signed)
Rx request sent to pharmacy.  

## 2022-10-30 ENCOUNTER — Encounter (HOSPITAL_COMMUNITY): Payer: Medicare Other

## 2022-10-30 ENCOUNTER — Ambulatory Visit (HOSPITAL_COMMUNITY): Payer: Medicare Other | Attending: Orthopedic Surgery

## 2022-10-30 DIAGNOSIS — M545 Low back pain, unspecified: Secondary | ICD-10-CM | POA: Diagnosis not present

## 2022-10-30 DIAGNOSIS — R29898 Other symptoms and signs involving the musculoskeletal system: Secondary | ICD-10-CM | POA: Insufficient documentation

## 2022-10-30 NOTE — Therapy (Addendum)
OUTPATIENT PHYSICAL THERAPY THORACOLUMBAR PROGRESS VISIT Progress Note Reporting Period 10/04/2022 to 10/30/2022 (patient has only come for his evaluation prior to today; will re-eval today)  See note below for Objective Data and Assessment of Progress/Goals.       Patient Name: Randall Meadows MRN: [id number] DOB:30-Jul-1976, 47 y.o., male Today's Date: 10/30/2022  END OF SESSION:  PT End of Session - 10/30/22 0955     Visit Number 2    Number of Visits 7    Date for PT Re-Evaluation 11/01/22    Authorization Type Medicaid [employer]    Authorization Time Period auth required please check             Past Medical History:  Diagnosis Date   Asthma    Hypertension    Past Surgical History:  Procedure Laterality Date   TONSILLECTOMY     Patient Active Problem List   Diagnosis Date Noted   Mild persistent asthma, uncomplicated 54/65/6812   Seasonal allergic rhinitis due to pollen 10/04/2022   Seasonal allergic conjunctivitis 10/04/2022   ANXIETY 04/03/2009   OBESITY 09/07/2007   HYPERTENSION 09/07/2007   ALLERGIC RHINITIS 09/07/2007   HEADACHE 09/07/2007   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/2007    PCP: Lorene Dy MD, PCP  REFERRING PROVIDER:  Carole Civil, MD [facility] [place] [facility] [place]    REFERRING DIAG:  Diagnosis  M54.50 (ICD-10-CM) - Lumbar pain    Rationale for Evaluation and Treatment: Rehabilitation  THERAPY DIAG:  Low back pain, unspecified back pain laterality, unspecified chronicity, unspecified whether sciatica present  Other symptoms and signs involving the musculoskeletal system  ONSET DATE: about a month  SUBJECTIVE:                                                                                                                                                                                           SUBJECTIVE STATEMENT: Patient states he has had oral surgery and has  had complications with that and has been sick so has been unable to attend therapy; he comes in today after his eval 10/04/2022; back pain is a little better just because he has been laying around a lot due to surgery and sickness   Eval:Patient reports insidious onset of back pain; mid to low back area; also having left foot pain; saw Dr. Aline Brochure and he did an x-ray of his low back; showed arthritis  PERTINENT HISTORY:  Bone spur in left foot; small fracture in left lower leg (fibula) per patient diagnosed 3 months ago per x-ray Having oral surgery on Monday 10/07/22 PAIN:  Are you  having pain? Yes: NPRS scale: 5/10 Pain location: low back Pain description: sore, aching Aggravating factors: bending over, prolonged sitting Relieving factors: rest, lying down, back supported  PRECAUTIONS: Fall  WEIGHT BEARING RESTRICTIONS: No  FALLS:  Has patient fallen in last 6 months? Yes. Number of falls 6  LIVING ENVIRONMENT: Lives with: lives with their family Lives in: House/apartment Stairs: No Has following equipment at home: Single point cane  OCCUPATION: disabled  PLOF: Needs assistance with ADLs occassionally  PATIENT GOALS: get my pain manageable  NEXT MD VISIT: 2 more weeks  OBJECTIVE:   DIAGNOSTIC FINDINGS:  Spinal imaging   Left lower back pain with some lower leg pain does not appear to be radicular patient has had frequent falls   X-rays show slight scoliosis in the lumbar L4-5 S1 area with increased lumbar lordosis disc spaces are open and preserved.  Facet joints show mild degenerative changes.   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    PATIENT SURVEYS:  FOTO 45   COGNITION: Overall cognitive status: Within functional limits for tasks assessed      POSTURE: rounded shoulders, forward head, and decreased lumbar lordosis  PALPATION: Tender low back paraspinals  LUMBAR ROM:   AROM eval 10/30/2022  Flexion 60% available * 80%  available  Extension 40% available 60% availalbe  Right lateral flexion    Left lateral flexion    Right rotation    Left rotation     (Blank rows = not tested)   LOWER EXTREMITY MMT:    MMT Right eval Left eval Right 10/30/2022 Left 10/30/2022  Hip flexion 4 4 4+ 5  Hip extension 3- 3- 3- 3-  Hip abduction      Hip adduction      Hip internal rotation      Hip external rotation      Knee flexion _0 Knee extension _1 4+  Ankle dorsiflexion _2 Ankle plantarflexion      Ankle inversion      Ankle eversion       (Blank rows = not tested)  L FUNCTIONAL TESTS:  5 times sit to stand: 20.74   TODAY'S TREATMENT:                                                                                                                           DATE:  10/30/2022 Progress note/re-eval FOTO 57 5 times sit to stand 20.48 sec MMT's and ROM see above  Review HEP and set rehab goals  Supine: LTR x 10 Abdominal bracing 5" hold x 10 Bridge x 10     10/03/22 physical therapy evaluation and HEP instruction    PATIENT EDUCATION:  Education details: Patient educated on exam findings, POC, scope of PT, HEP. Person educated: Patient Education method: Explanation, Demonstration, and Handouts Education comprehension: verbalized understanding, returned demonstration, verbal cues required, and tactile cues required   HOME EXERCISE PROGRAM:  Access Code: [email or web address] URL: [email or web address] Date: 10/04/2022 Prepared by: AP - Rehab  Exercises - Supine Lower Trunk Rotation  - 2-3 x daily - 7 x weekly - 1 sets - 10 reps - Supine Transversus Abdominis Bracing - Hands on Stomach  - 2-3 x daily - 7 x weekly - 1 sets - 10 reps - Supine Bridge  - 2-3 x daily - 7 x weekly - 1 sets - 10 reps  ASSESSMENT:  CLINICAL IMPRESSION: Patient is a 47 y.o. male who was seen today for physical therapy evaluation and treatment for low back pain. Patient demonstrates muscle  weakness, reduced ROM, and fascial restrictions which are likely contributing to symptoms of pain and are negatively impacting patient ability to perform ADLs and functional mobility tasks. Patient will benefit from skilled physical therapy services to address these deficits to reduce pain and improve level of function with ADLs and functional mobility tasks.   OBJECTIVE IMPAIRMENTS: decreased activity tolerance, decreased balance, decreased endurance, decreased knowledge of condition, decreased mobility, difficulty walking, decreased ROM, decreased strength, hypomobility, increased fascial restrictions, impaired perceived functional ability, increased muscle spasms, impaired flexibility, obesity, and pain.   ACTIVITY LIMITATIONS: carrying, lifting, bending, sitting, standing, squatting, transfers, and locomotion level  PARTICIPATION LIMITATIONS: meal prep, cleaning, laundry, shopping, community activity, and yard work  [employer] POTENTIAL: Good  CLINICAL DECISION MAKING: Stable/uncomplicated  EVALUATION COMPLEXITY: Low   GOALS: Goals reviewed with patient? No  SHORT TERM GOALS: Target date: 11/13/2021  Patient will be independent with initial HEP  Baseline: Goal status: MET  2.   Patient will report at least 50% improvement in overall symptoms and/or function to demonstrate improved functional mobility  Baseline:  Goal status: IN PROGRESS    LONG TERM GOALS: Target date: 11/27/2022  Patient will be independent in self management strategies to improve quality of life and functional outcomes.  Baseline:  Goal status: IN PROGRESS  2.   Patient will report at least 75% improvement in overall symptoms and/or function to demonstrate improved functional mobility  Baseline:  Goal status: IN PROGRESS  3.  Patient will increase  leg MMTs to 5/5 without pain to promote return to ambulation community distances with minimal deviation.    Baseline: see above Goal status: IN  PROGRESS  4.  Patient will improve FOTO score to predicted value  Baseline: 45 Goal status: IN PROGRESS  5.  Patient will improve 5 times sit to stand score from 20.74 sec to 16 sec to demonstrate improved functional mobility and increased lower extremity strength.  Baseline:  Goal status: IN PROGRESS   PLAN:  PT FREQUENCY: 1-2x/week  PT DURATION: 4 weeks  PLANNED INTERVENTIONS:  Therapeutic exercises, Therapeutic activity, Neuromuscular re-education, Balance training, Gait training, Patient/Family education, Joint manipulation, Joint mobilization, Stair training, Orthotic/Fit training, DME instructions, Aquatic Therapy, Dry Needling, Electrical stimulation, Spinal manipulation, Spinal mobilization, Cryotherapy, Moist heat, Compression bandaging, scar mobilization, Splintting, Taping, Traction, Ultrasound, Ionotophoresis 72m/ml Dexamethasone, and Manual therapy .  PLAN FOR NEXT SESSION: progress lumbar mobility and core strengthening as able    10:07 AM, 10/30/22 Danie Hannig Small Ashlye Oviedo MPT [place] [facility] [place] #[phone]

## 2022-11-01 ENCOUNTER — Ambulatory Visit (HOSPITAL_COMMUNITY): Payer: Medicare Other

## 2022-11-01 DIAGNOSIS — M545 Low back pain, unspecified: Secondary | ICD-10-CM | POA: Diagnosis not present

## 2022-11-01 DIAGNOSIS — R29898 Other symptoms and signs involving the musculoskeletal system: Secondary | ICD-10-CM

## 2022-11-01 NOTE — Therapy (Signed)
OUTPATIENT PHYSICAL THERAPY THORACOLUMBAR PROGRESS VISIT Progress Note Reporting Period 10/04/2022 to 10/30/2022 (patient has only come for his evaluation prior to today; will re-eval today)  See note below for Objective Data and Assessment of Progress/Goals.       Patient Name: Randall Meadows MRN: [id number] DOB:11/10/1975, 47 y.o., male Today's Date: 11/01/2022  END OF SESSION:  PT End of Session - 11/01/22 0813     Visit Number 3    Number of Visits 7    Date for PT Re-Evaluation 11/20/22    Authorization Type Medicaid [employer]    Authorization Time Period auth required put in new request 10/30/22  please check    Progress Note Due on Visit 7    PT Start Time 0810    PT Stop Time 0850    PT Time Calculation (min) 40 min             Past Medical History:  Diagnosis Date   Asthma    Hypertension    Past Surgical History:  Procedure Laterality Date   TONSILLECTOMY     Patient Active Problem List   Diagnosis Date Noted   Mild persistent asthma, uncomplicated 10/04/2022   Seasonal allergic rhinitis due to pollen 10/04/2022   Seasonal allergic conjunctivitis 10/04/2022   ANXIETY 04/03/2009   OBESITY 09/07/2007   HYPERTENSION 09/07/2007   ALLERGIC RHINITIS 09/07/2007   HEADACHE 09/07/2007   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/2007    PCP: Burton Apley MD, PCP  REFERRING PROVIDER:  Vickki Hearing, MD [facility] [place]    REFERRING DIAG:  Diagnosis  M54.50 (ICD-10-CM) - Lumbar pain    Rationale for Evaluation and Treatment: Rehabilitation  THERAPY DIAG:  Low back pain, unspecified back pain laterality, unspecified chronicity, unspecified whether sciatica present  Other symptoms and signs involving the musculoskeletal system  ONSET DATE: about a month  SUBJECTIVE:                                                                                                                                                                                            SUBJECTIVE STATEMENT: Patient reports he is having some left foot pain today; taking medication for it per Dr. Romeo Apple.  Back is a little stiff this morning; 2/10 pain back   Eval:Patient reports insidious onset of back pain; mid to low back area; also having left foot pain; saw Dr. Romeo Apple and he did an x-ray of his low back; showed arthritis  PERTINENT HISTORY:  Bone spur in left foot; small fracture in left lower leg (fibula) per patient diagnosed 3  months ago per x-ray Having oral surgery on Monday 10/07/22 PAIN:  Are you having pain? Yes: NPRS scale: 5/10 Pain location: low back Pain description: sore, aching Aggravating factors: bending over, prolonged sitting Relieving factors: rest, lying down, back supported  PRECAUTIONS: Fall  WEIGHT BEARING RESTRICTIONS: No  FALLS:  Has patient fallen in last 6 months? Yes. Number of falls 6  LIVING ENVIRONMENT: Lives with: lives with their family Lives in: House/apartment Stairs: No Has following equipment at home: Single point cane  OCCUPATION: disabled  PLOF: Needs assistance with ADLs occassionally  PATIENT GOALS: get my pain manageable  NEXT MD VISIT: 2 more weeks  OBJECTIVE:   DIAGNOSTIC FINDINGS:  Spinal imaging   Left lower back pain with some lower leg pain does not appear to be radicular patient has had frequent falls   X-rays show slight scoliosis in the lumbar L4-5 S1 area with increased lumbar lordosis disc spaces are open and preserved.  Facet joints show mild degenerative changes.   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    PATIENT SURVEYS:  FOTO 45   COGNITION: Overall cognitive status: Within functional limits for tasks assessed      POSTURE: rounded shoulders, forward head, and decreased lumbar lordosis  PALPATION: Tender low back paraspinals  LUMBAR ROM:   AROM eval 10/30/2022  Flexion 60%  available * 80% available  Extension 40% available 60% availalbe  Right lateral flexion    Left lateral flexion    Right rotation    Left rotation     (Blank rows = not tested)   LOWER EXTREMITY MMT:    MMT Right eval Left eval Right 10/30/2022 Left 10/30/2022  Hip flexion 4 4 4+ 5  Hip extension 3- 3- 3- 3-  Hip abduction      Hip adduction      Hip internal rotation      Hip external rotation      Knee flexion 4 4 4 4   Knee extension 5 3  5  4+  Ankle dorsiflexion 5 5 5 5   Ankle plantarflexion      Ankle inversion      Ankle eversion       (Blank rows = not tested)  L FUNCTIONAL TESTS:  5 times sit to stand: 20.74   TODAY'S TREATMENT:                                                                                                                           DATE:  11/01/2022 LTR x 10 Abdominal bracing 5" hold x 10 Bridge 5 " x 10 Abdominal bracing with SLR x 10 each Abdominal bracing with march x 10 Ball squeeze for hip adduction with bridge x 10 Hooklying clam with RTB 2  x 10  Sit to stand from edge of mat 2 x 5 no UE assist  Standing: BTB scapular retrations  BTB shoulder extensions  Heel/toe raises 2 x 10 Slant board 5 x 20" Hip  vectors 3" hold x 5 each        10/30/2022 Progress note/re-eval FOTO 57 5 times sit to stand 20.48 sec MMT's and ROM see above  Review HEP and set rehab goals  Supine: LTR x 10 Abdominal bracing 5" hold x 10 Bridge x 10     10/03/22 physical therapy evaluation and HEP instruction    PATIENT EDUCATION:  Education details: Patient educated on exam findings, POC, scope of PT, HEP. Person educated: Patient Education method: Explanation, Demonstration, and Handouts Education comprehension: verbalized understanding, returned demonstration, verbal cues required, and tactile cues required   HOME EXERCISE PROGRAM: 11/01/22 bridge with ball, abdominal bracing with SLR, abdominal bracing with march, hooklying clam  with RTB Access Code: [email or web address] URL: [email or web address] Date: 10/04/2022 Prepared by: AP - Rehab  Exercises - Supine Lower Trunk Rotation  - 2-3 x daily - 7 x weekly - 1 sets - 10 reps - Supine Transversus Abdominis Bracing - Hands on Stomach  - 2-3 x daily - 7 x weekly - 1 sets - 10 reps - Supine Bridge  - 2-3 x daily - 7 x weekly - 1 sets - 10 reps  ASSESSMENT:  CLINICAL IMPRESSION: Today's session continued to focus on lower extremity and core strengthening; added standing exercise today.  Patient with most difficulty with bridging exercise; noted posterior chain weakness.  Updated HEP.  Patient will benefit from continued skilled therapy services to address deficits and promote return to optimal function.        Eval:Patient is a 47 y.o. male who was seen today for physical therapy evaluation and treatment for low back pain. Patient demonstrates muscle weakness, reduced ROM, and fascial restrictions which are likely contributing to symptoms of pain and are negatively impacting patient ability to perform ADLs and functional mobility tasks. Patient will benefit from skilled physical therapy services to address these deficits to reduce pain and improve level of function with ADLs and functional mobility tasks.   OBJECTIVE IMPAIRMENTS: decreased activity tolerance, decreased balance, decreased endurance, decreased knowledge of condition, decreased mobility, difficulty walking, decreased ROM, decreased strength, hypomobility, increased fascial restrictions, impaired perceived functional ability, increased muscle spasms, impaired flexibility, obesity, and pain.   ACTIVITY LIMITATIONS: carrying, lifting, bending, sitting, standing, squatting, transfers, and locomotion level  PARTICIPATION LIMITATIONS: meal prep, cleaning, laundry, shopping, community activity, and yard work  [facility] POTENTIAL: Good  CLINICAL DECISION MAKING: Stable/uncomplicated  EVALUATION COMPLEXITY:  Low   GOALS: Goals reviewed with patient? No  SHORT TERM GOALS: Target date: 11/13/2021  Patient will be independent with initial HEP  Baseline: Goal status: MET  2.   Patient will report at least 50% improvement in overall symptoms and/or function to demonstrate improved functional mobility  Baseline:  Goal status: IN PROGRESS    LONG TERM GOALS: Target date: 11/27/2022  Patient will be independent in self management strategies to improve quality of life and functional outcomes.  Baseline:  Goal status: IN PROGRESS  2.   Patient will report at least 75% improvement in overall symptoms and/or function to demonstrate improved functional mobility  Baseline:  Goal status: IN PROGRESS  3.  Patient will increase  leg MMTs to 5/5 without pain to promote return to ambulation community distances with minimal deviation.    Baseline: see above Goal status: IN PROGRESS  4.  Patient will improve FOTO score to predicted value  Baseline: 45 Goal status: IN PROGRESS  5.  Patient will improve 5 times sit to stand score from 20.74 sec to  16 sec to demonstrate improved functional mobility and increased lower extremity strength.  Baseline:  Goal status: IN PROGRESS   PLAN:  PT FREQUENCY: 1-2x/week  PT DURATION: 4 weeks  PLANNED INTERVENTIONS:  Therapeutic exercises, Therapeutic activity, Neuromuscular re-education, Balance training, Gait training, Patient/Family education, Joint manipulation, Joint mobilization, Stair training, Orthotic/Fit training, DME instructions, Aquatic Therapy, Dry Needling, Electrical stimulation, Spinal manipulation, Spinal mobilization, Cryotherapy, Moist heat, Compression bandaging, scar mobilization, Splintting, Taping, Traction, Ultrasound, Ionotophoresis 4mg /ml Dexamethasone, and Manual therapy .  PLAN FOR NEXT SESSION: progress lumbar mobility and core strengthening as able    8:53 AM, 11/01/22 Randall Meadows MPT [place] [facility] [place] [phone]

## 2022-11-05 ENCOUNTER — Encounter (HOSPITAL_COMMUNITY): Payer: Medicare Other

## 2022-11-07 ENCOUNTER — Ambulatory Visit (HOSPITAL_COMMUNITY): Payer: Medicare Other

## 2022-11-07 DIAGNOSIS — M545 Low back pain, unspecified: Secondary | ICD-10-CM

## 2022-11-07 DIAGNOSIS — R29898 Other symptoms and signs involving the musculoskeletal system: Secondary | ICD-10-CM

## 2022-11-07 NOTE — Therapy (Addendum)
OUTPATIENT PHYSICAL THERAPY THORACOLUMBAR TREATMENT     Patient Name: Randall Meadows MRN: [id number] DOB:06/22/1976, 47 y.o., male Today's Date: 11/07/2022  END OF SESSION:  PT End of Session - 11/07/22 1111     Visit Number 3    Number of Visits 8    Date for PT Re-Evaluation 11/20/22    Authorization Type Medicaid [employer]    Authorization Time Period 8 visits from 10/31/22 to 11/27/22    Authorization - Visit Number 2    Authorization - Number of Visits 8    Progress Note Due on Visit 8    PT Start Time 5102    PT Stop Time 1151    PT Time Calculation (min) 40 min             Past Medical History:  Diagnosis Date   Asthma    Hypertension    Past Surgical History:  Procedure Laterality Date   TONSILLECTOMY     Patient Active Problem List   Diagnosis Date Noted   Mild persistent asthma, uncomplicated 58/52/7782   Seasonal allergic rhinitis due to pollen 10/04/2022   Seasonal allergic conjunctivitis 10/04/2022   ANXIETY 04/03/2009   OBESITY 09/07/2007   HYPERTENSION 09/07/2007   ALLERGIC RHINITIS 09/07/2007   HEADACHE 09/07/2007   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/2007    PCP: Lorene Dy MD, PCP  REFERRING PROVIDER:  Carole Civil, MD [facility] [place]    REFERRING DIAG:  Diagnosis  M54.50 (ICD-10-CM) - Lumbar pain    Rationale for Evaluation and Treatment: Rehabilitation  THERAPY DIAG:  Low back pain, unspecified back pain laterality, unspecified chronicity, unspecified whether sciatica present  Other symptoms and signs involving the musculoskeletal system  ONSET DATE: about a month  SUBJECTIVE:                                                                                                                                                                                           SUBJECTIVE STATEMENT: My left foot is really hurting today; and my back is also hurting;  stiffness in the back; did some exercise in the bed to loosen up.  He had some chest pain yesterday but none today.  9/10 pain left foot.  8/10 low back pain.     Eval:Patient reports insidious onset of back pain; mid to low back area; also having left foot pain; saw Dr. Aline Brochure and he did an x-ray of his low back; showed arthritis  PERTINENT HISTORY:  Bone spur in left foot; small fracture in left lower leg (fibula) per patient diagnosed 3 months  ago per x-ray Having oral surgery on Monday 10/07/22 PAIN:  Are you having pain? Yes: NPRS scale: 9/10 Pain location: low back Pain description: sore, aching Aggravating factors: bending over, prolonged sitting Relieving factors: rest, lying down, back supported  PRECAUTIONS: Fall  WEIGHT BEARING RESTRICTIONS: No  FALLS:  Has patient fallen in last 6 months? Yes. Number of falls 6  LIVING ENVIRONMENT: Lives with: lives with their family Lives in: House/apartment Stairs: No Has following equipment at home: Single point cane  OCCUPATION: disabled  PLOF: Needs assistance with ADLs occassionally  PATIENT GOALS: get my pain manageable  NEXT MD VISIT: 2 more weeks  OBJECTIVE:   DIAGNOSTIC FINDINGS:  Spinal imaging   Left lower back pain with some lower leg pain does not appear to be radicular patient has had frequent falls   X-rays show slight scoliosis in the lumbar L4-5 S1 area with increased lumbar lordosis disc spaces are open and preserved.  Facet joints show mild degenerative changes.   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    PATIENT SURVEYS:  FOTO 45   COGNITION: Overall cognitive status: Within functional limits for tasks assessed      POSTURE: rounded shoulders, forward head, and decreased lumbar lordosis  PALPATION: Tender low back paraspinals  LUMBAR ROM:   AROM eval 10/30/2022  Flexion 60% available * 80% available  Extension 40% available 60% availalbe  Right lateral flexion     Left lateral flexion    Right rotation    Left rotation     (Blank rows = not tested)   LOWER EXTREMITY MMT:    MMT Right eval Left eval Right 10/30/2022 Left 10/30/2022  Hip flexion 4 4 4+ 5  Hip extension 3- 3- 3- 3-  Hip abduction      Hip adduction      Hip internal rotation      Hip external rotation      Knee flexion 4 4 4 4   Knee extension 5 3  5  4+  Ankle dorsiflexion 5 5 5 5   Ankle plantarflexion      Ankle inversion      Ankle eversion       (Blank rows = not tested)  L FUNCTIONAL TESTS:  5 times sit to stand: 20.74   TODAY'S TREATMENT:                                                                                                                           DATE:  11/07/21 Supine: LTR x 20 Hamstring stretch 5 x 20" each SKTC 5 x 20" each Abdominal bracing 5" x 10 Hip adduction with ball 5" x 15 Hip abduction with BTB 2 x 10 Abdominal bracing with a march 2 x 10   11/01/2022 LTR x 10 Abdominal bracing 5" hold x 10 Bridge 5 " x 10 Abdominal bracing with SLR x 10 each Abdominal bracing with march x 10 Ball squeeze for hip adduction with bridge x  10 Hooklying clam with RTB 2  x 10  Sit to stand from edge of mat 2 x 5 no UE assist  Standing: BTB scapular retrations  BTB shoulder extensions  Heel/toe raises 2 x 10 Slant board 5 x 20" Hip vectors 3" hold x 5 each        10/30/2022 Progress note/re-eval FOTO 57 5 times sit to stand 20.48 sec MMT's and ROM see above  Review HEP and set rehab goals  Supine: LTR x 10 Abdominal bracing 5" hold x 10 Bridge x 10     10/03/22 physical therapy evaluation and HEP instruction    PATIENT EDUCATION:  Education details: Patient educated on exam findings, POC, scope of PT, HEP. Person educated: Patient Education method: Explanation, Demonstration, and Handouts Education comprehension: verbalized understanding, returned demonstration, verbal cues required, and tactile cues  required   HOME EXERCISE PROGRAM: 11/07/22 SKTC and hamstring stretch 11/01/22 bridge with ball, abdominal bracing with SLR, abdominal bracing with march, hooklying clam with RTB Access Code: [email or web address] URL: [email or web address] Date: 10/04/2022 Prepared by: [facility] - Rehab  Exercises - Supine Lower Trunk Rotation  - 2-3 x daily - 7 x weekly - 1 sets - 10 reps - Supine Transversus Abdominis Bracing - Hands on Stomach  - 2-3 x daily - 7 x weekly - 1 sets - 10 reps - Supine Bridge  - 2-3 x daily - 7 x weekly - 1 sets - 10 reps  ASSESSMENT:  CLINICAL IMPRESSION: Patient noted increased limp today; decreased stance left foot.  Patient had some chest pain yesterday but none today; discussed with patient going to the ED with chest pain as this is an emergent symptom.  He verbalizes understanding. Stayed with mat exercise and stretching today due to patient increased pain complaint.  Added hamstring stretch and SKTC to HEP.   Decreased low back pain to 5/10 after treatment.  Patient will benefit from continued skilled therapy services to address deficits and promote return to optimal function.        Eval:Patient is a 47 y.o. male who was seen today for physical therapy evaluation and treatment for low back pain. Patient demonstrates muscle weakness, reduced ROM, and fascial restrictions which are likely contributing to symptoms of pain and are negatively impacting patient ability to perform ADLs and functional mobility tasks. Patient will benefit from skilled physical therapy services to address these deficits to reduce pain and improve level of function with ADLs and functional mobility tasks.   OBJECTIVE IMPAIRMENTS: decreased activity tolerance, decreased balance, decreased endurance, decreased knowledge of condition, decreased mobility, difficulty walking, decreased ROM, decreased strength, hypomobility, increased fascial restrictions, impaired perceived functional ability, increased  muscle spasms, impaired flexibility, obesity, and pain.   ACTIVITY LIMITATIONS: carrying, lifting, bending, sitting, standing, squatting, transfers, and locomotion level  PARTICIPATION LIMITATIONS: meal prep, cleaning, laundry, shopping, community activity, and yard work  [employer] POTENTIAL: Good  CLINICAL DECISION MAKING: Stable/uncomplicated  EVALUATION COMPLEXITY: Low   GOALS: Goals reviewed with patient? No  SHORT TERM GOALS: Target date: 11/13/2021  Patient will be independent with initial HEP  Baseline: Goal status: MET  2.   Patient will report at least 50% improvement in overall symptoms and/or function to demonstrate improved functional mobility  Baseline:  Goal status: IN PROGRESS    LONG TERM GOALS: Target date: 11/27/2022  Patient will be independent in self management strategies to improve quality of life and functional outcomes.  Baseline:  Goal status: IN PROGRESS  2.   Patient will  report at least 75% improvement in overall symptoms and/or function to demonstrate improved functional mobility  Baseline:  Goal status: IN PROGRESS  3.  Patient will increase  leg MMTs to 5/5 without pain to promote return to ambulation community distances with minimal deviation.    Baseline: see above Goal status: IN PROGRESS  4.  Patient will improve FOTO score to predicted value  Baseline: 45 Goal status: IN PROGRESS  5.  Patient will improve 5 times sit to stand score from 20.74 sec to 16 sec to demonstrate improved functional mobility and increased lower extremity strength.  Baseline:  Goal status: IN PROGRESS   PLAN:  PT FREQUENCY: 1-2x/week  PT DURATION: 4 weeks  PLANNED INTERVENTIONS:  Therapeutic exercises, Therapeutic activity, Neuromuscular re-education, Balance training, Gait training, Patient/Family education, Joint manipulation, Joint mobilization, Stair training, Orthotic/Fit training, DME instructions, Aquatic Therapy, Dry Needling, Electrical  stimulation, Spinal manipulation, Spinal mobilization, Cryotherapy, Moist heat, Compression bandaging, scar mobilization, Splintting, Taping, Traction, Ultrasound, Ionotophoresis 4mg /ml Dexamethasone, and Manual therapy .  PLAN FOR NEXT SESSION: progress lumbar mobility and core strengthening as able    11:49 AM, 11/07/22 Tameisha Covell Small Khaliya Golinski MPT [place] [facility] [place] [phone]

## 2022-11-11 ENCOUNTER — Ambulatory Visit (HOSPITAL_COMMUNITY): Payer: Medicare Other

## 2022-11-11 DIAGNOSIS — R29898 Other symptoms and signs involving the musculoskeletal system: Secondary | ICD-10-CM

## 2022-11-11 DIAGNOSIS — M545 Low back pain, unspecified: Secondary | ICD-10-CM

## 2022-11-11 NOTE — Therapy (Signed)
OUTPATIENT PHYSICAL THERAPY THORACOLUMBAR TREATMENT     Patient Name: Randall Meadows MRN: [id number] DOB:Mar 06, 1976, 47 y.o., male Today's Date: 11/11/2022  END OF SESSION:  PT End of Session - 11/11/22 0831     Visit Number 4    Number of Visits 8    Date for PT Re-Evaluation 11/20/22    Authorization Type Medicaid [employer]    Authorization Time Period 8 visits from 10/31/22 to 11/27/22    Authorization - Visit Number 3    Authorization - Number of Visits 8    Progress Note Due on Visit 8    PT Start Time 0829   late arrival   PT Stop Time 0859    PT Time Calculation (min) 30 min             Past Medical History:  Diagnosis Date   Asthma    Hypertension    Past Surgical History:  Procedure Laterality Date   TONSILLECTOMY     Patient Active Problem List   Diagnosis Date Noted   Mild persistent asthma, uncomplicated 10/04/2022   Seasonal allergic rhinitis due to pollen 10/04/2022   Seasonal allergic conjunctivitis 10/04/2022   ANXIETY 04/03/2009   OBESITY 09/07/2007   HYPERTENSION 09/07/2007   ALLERGIC RHINITIS 09/07/2007   HEADACHE 09/07/2007   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/2007    PCP: Burton Apley MD, PCP  REFERRING PROVIDER:  Vickki Hearing, MD [facility] [place]    REFERRING DIAG:  Diagnosis  M54.50 (ICD-10-CM) - Lumbar pain    Rationale for Evaluation and Treatment: Rehabilitation  THERAPY DIAG:  Low back pain, unspecified back pain laterality, unspecified chronicity, unspecified whether sciatica present  Other symptoms and signs involving the musculoskeletal system  ONSET DATE: about a month  SUBJECTIVE:                                                                                                                                                                                           SUBJECTIVE STATEMENT: Patient overslept this morning; "so sorry I am late";  patient reports no more chest pain; feels was related to stress; back was worse on Saturday but a little better today although still hurting; states right ankle was hurting some unknown cause.  Eval:Patient reports insidious onset of back pain; mid to low back area; also having left foot pain; saw Dr. Romeo Apple and he did an x-ray of his low back; showed arthritis  PERTINENT HISTORY:  Bone spur in left foot; small fracture in left lower leg (fibula) per patient diagnosed 3 months ago per x-ray Having oral  surgery on Monday 10/07/22 PAIN:  Are you having pain? Yes: NPRS scale: 7/10 Pain location: low back Pain description: sore, aching Aggravating factors: bending over, prolonged sitting Relieving factors: rest, lying down, back supported  PRECAUTIONS: Fall  WEIGHT BEARING RESTRICTIONS: No  FALLS:  Has patient fallen in last 6 months? Yes. Number of falls 6  LIVING ENVIRONMENT: Lives with: lives with their family Lives in: House/apartment Stairs: No Has following equipment at home: Single point cane  OCCUPATION: disabled  PLOF: Needs assistance with ADLs occassionally  PATIENT GOALS: get my pain manageable  NEXT MD VISIT: 2 more weeks  OBJECTIVE:   DIAGNOSTIC FINDINGS:  Spinal imaging   Left lower back pain with some lower leg pain does not appear to be radicular patient has had frequent falls   X-rays show slight scoliosis in the lumbar L4-5 S1 area with increased lumbar lordosis disc spaces are open and preserved.  Facet joints show mild degenerative changes.   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    PATIENT SURVEYS:  FOTO 45   COGNITION: Overall cognitive status: Within functional limits for tasks assessed      POSTURE: rounded shoulders, forward head, and decreased lumbar lordosis  PALPATION: Tender low back paraspinals  LUMBAR ROM:   AROM eval 10/30/2022  Flexion 60% available * 80% available  Extension 40% available 60%  availalbe  Right lateral flexion    Left lateral flexion    Right rotation    Left rotation     (Blank rows = not tested)   LOWER EXTREMITY MMT:    MMT Right eval Left eval Right 10/30/2022 Left 10/30/2022  Hip flexion 4 4 4+ 5  Hip extension 3- 3- 3- 3-  Hip abduction      Hip adduction      Hip internal rotation      Hip external rotation      Knee flexion 4 4 4 4   Knee extension 5 3  5  4+  Ankle dorsiflexion 5 5 5 5   Ankle plantarflexion      Ankle inversion      Ankle eversion       (Blank rows = not tested)  L FUNCTIONAL TESTS:  5 times sit to stand: 20.74   TODAY'S TREATMENT:                                                                                                                           DATE:  11/11/21 Supine: LTR x 20 SKTC 5 x 20" Hamstring stretch 5 x 20" Abdominal bracing 5" hold x 10 Hooklying hip abduction with BTB 2 x 10 Hooklying hip adduction with ball 5" hold x 10 Abdominal bracing with march x 10  Standing: Slant board 5 x 20" BTB scapular retraction 2 x 10   11/07/21 Supine: LTR x 20 Hamstring stretch 5 x 20" each SKTC 5 x 20" each Abdominal bracing 5" x 10 Hip adduction with ball 5" x 15  Hip abduction with BTB 2 x 10 Abdominal bracing with a march 2 x 10   11/01/2022 LTR x 10 Abdominal bracing 5" hold x 10 Bridge 5 " x 10 Abdominal bracing with SLR x 10 each Abdominal bracing with march x 10 Ball squeeze for hip adduction with bridge x 10 Hooklying clam with RTB 2  x 10  Sit to stand from edge of mat 2 x 5 no UE assist  Standing: BTB scapular retrations  BTB shoulder extensions  Heel/toe raises 2 x 10 Slant board 5 x 20" Hip vectors 3" hold x 5 each        10/30/2022 Progress note/re-eval FOTO 57 5 times sit to stand 20.48 sec MMT's and ROM see above  Review HEP and set rehab goals  Supine: LTR x 10 Abdominal bracing 5" hold x 10 Bridge x 10     10/03/22 physical therapy evaluation and HEP  instruction    PATIENT EDUCATION:  Education details: Patient educated on exam findings, POC, scope of PT, HEP. Person educated: Patient Education method: Explanation, Demonstration, and Handouts Education comprehension: verbalized understanding, returned demonstration, verbal cues required, and tactile cues required   HOME EXERCISE PROGRAM: 11/07/22 SKTC and hamstring stretch 11/01/22 bridge with ball, abdominal bracing with SLR, abdominal bracing with march, hooklying clam with RTB Access Code: [email or web address] URL: [email or web address] Date: 10/04/2022 Prepared by: [facility] - Rehab  Exercises - Supine Lower Trunk Rotation  - 2-3 x daily - 7 x weekly - 1 sets - 10 reps - Supine Transversus Abdominis Bracing - Hands on Stomach  - 2-3 x daily - 7 x weekly - 1 sets - 10 reps - Supine Bridge  - 2-3 x daily - 7 x weekly - 1 sets - 10 reps  ASSESSMENT:  CLINICAL IMPRESSION: Late arrival today.  Continued with lumbar mobility and strengthening. Added slant board for posterior chain stretching today.   Antalgic gait continues with decreased stance left foot; external rotation of left foot.  Right ankle soreness likely due to limp secondary to left foot pain.   Patient will benefit from continued skilled therapy services to address deficits and promote return to optimal function.        Eval:Patient is a 47 y.o. male who was seen today for physical therapy evaluation and treatment for low back pain. Patient demonstrates muscle weakness, reduced ROM, and fascial restrictions which are likely contributing to symptoms of pain and are negatively impacting patient ability to perform ADLs and functional mobility tasks. Patient will benefit from skilled physical therapy services to address these deficits to reduce pain and improve level of function with ADLs and functional mobility tasks.   OBJECTIVE IMPAIRMENTS: decreased activity tolerance, decreased balance, decreased endurance, decreased  knowledge of condition, decreased mobility, difficulty walking, decreased ROM, decreased strength, hypomobility, increased fascial restrictions, impaired perceived functional ability, increased muscle spasms, impaired flexibility, obesity, and pain.   ACTIVITY LIMITATIONS: carrying, lifting, bending, sitting, standing, squatting, transfers, and locomotion level  PARTICIPATION LIMITATIONS: meal prep, cleaning, laundry, shopping, community activity, and yard work  [employer] POTENTIAL: Good  CLINICAL DECISION MAKING: Stable/uncomplicated  EVALUATION COMPLEXITY: Low   GOALS: Goals reviewed with patient? No  SHORT TERM GOALS: Target date: 11/13/2021  Patient will be independent with initial HEP  Baseline: Goal status: MET  2.   Patient will report at least 50% improvement in overall symptoms and/or function to demonstrate improved functional mobility  Baseline:  Goal status: IN PROGRESS    LONG TERM GOALS: Target date: 11/27/2022  Patient will be independent in self management strategies to improve quality of life and functional outcomes.  Baseline:  Goal status: IN PROGRESS  2.   Patient will report at least 75% improvement in overall symptoms and/or function to demonstrate improved functional mobility  Baseline:  Goal status: IN PROGRESS  3.  Patient will increase  leg MMTs to 5/5 without pain to promote return to ambulation community distances with minimal deviation.    Baseline: see above Goal status: IN PROGRESS  4.  Patient will improve FOTO score to predicted value  Baseline: 45 Goal status: IN PROGRESS  5.  Patient will improve 5 times sit to stand score from 20.74 sec to 16 sec to demonstrate improved functional mobility and increased lower extremity strength.  Baseline:  Goal status: IN PROGRESS   PLAN:  PT FREQUENCY: 1-2x/week  PT DURATION: 4 weeks  PLANNED INTERVENTIONS:  Therapeutic exercises, Therapeutic activity, Neuromuscular re-education,  Balance training, Gait training, Patient/Family education, Joint manipulation, Joint mobilization, Stair training, Orthotic/Fit training, DME instructions, Aquatic Therapy, Dry Needling, Electrical stimulation, Spinal manipulation, Spinal mobilization, Cryotherapy, Moist heat, Compression bandaging, scar mobilization, Splintting, Taping, Traction, Ultrasound, Ionotophoresis 4mg /ml Dexamethasone, and Manual therapy .  PLAN FOR NEXT SESSION: progress lumbar mobility and core strengthening as able    9:01 AM, 11/11/22 Tenoch Mcclure Small Hazaiah [place] MPT  physical therapy [place] [phone]

## 2022-11-13 ENCOUNTER — Encounter (HOSPITAL_COMMUNITY): Payer: Medicare Other

## 2022-11-14 ENCOUNTER — Encounter (HOSPITAL_COMMUNITY): Payer: Self-pay | Admitting: Physical Therapy

## 2022-11-14 ENCOUNTER — Ambulatory Visit (HOSPITAL_COMMUNITY): Payer: Medicare Other | Admitting: Physical Therapy

## 2022-11-14 DIAGNOSIS — M545 Low back pain, unspecified: Secondary | ICD-10-CM

## 2022-11-14 DIAGNOSIS — R29898 Other symptoms and signs involving the musculoskeletal system: Secondary | ICD-10-CM

## 2022-11-14 NOTE — Therapy (Signed)
OUTPATIENT PHYSICAL THERAPY THORACOLUMBAR TREATMENT     Patient Name: Randall Meadows MRN: [id number] DOB:1976/08/10, 47 y.o., male Today's Date: 11/14/2022  END OF SESSION:  PT End of Session - 11/14/22 0731     Visit Number 5    Number of Visits 8    Date for PT Re-Evaluation 11/20/22    Authorization Type Medicaid [employer]    Authorization Time Period 8 visits from 10/31/22 to 11/27/22    Authorization - Visit Number 4    Authorization - Number of Visits 8    Progress Note Due on Visit 8    PT Start Time 0731    PT Stop Time 0809    PT Time Calculation (min) 38 min    Activity Tolerance Patient tolerated treatment well    Behavior During Therapy [facility] for tasks assessed/performed             Past Medical History:  Diagnosis Date   Asthma    Hypertension    Past Surgical History:  Procedure Laterality Date   TONSILLECTOMY     Patient Active Problem List   Diagnosis Date Noted   Mild persistent asthma, uncomplicated 08/21/8526   Seasonal allergic rhinitis due to pollen 10/04/2022   Seasonal allergic conjunctivitis 10/04/2022   ANXIETY 04/03/2009   OBESITY 09/07/2007   HYPERTENSION 09/07/2007   ALLERGIC RHINITIS 09/07/2007   HEADACHE 09/07/2007   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/2007    PCP: Lorene Dy MD, PCP  REFERRING PROVIDER:  Carole Civil, MD [place] [facility] [place]    REFERRING DIAG:  Diagnosis  M54.50 (ICD-10-CM) - Lumbar pain    Rationale for Evaluation and Treatment: Rehabilitation  THERAPY DIAG:  Low back pain, unspecified back pain laterality, unspecified chronicity, unspecified whether sciatica present  Other symptoms and signs involving the musculoskeletal system  ONSET DATE: about a month  SUBJECTIVE:                                                                                                                                                                                            SUBJECTIVE STATEMENT: Patient states back is ok today. HEP is going well. Mid to lower back still bothers him. Sitting/standing for prolonged periods aggravates things.  Eval:Patient reports insidious onset of back pain; mid to low back area; also having left foot pain; saw Dr. Aline Brochure and he did an x-ray of his low back; showed arthritis  PERTINENT HISTORY:  Bone spur in left foot; small fracture in left lower leg (fibula) per patient diagnosed 3 months ago per x-ray Having oral surgery on  Monday 10/07/22 PAIN:  Are you having pain? Yes: NPRS scale: 0/10 Pain location: low back Pain description: sore, aching Aggravating factors: bending over, prolonged sitting Relieving factors: rest, lying down, back supported  PRECAUTIONS: Fall  WEIGHT BEARING RESTRICTIONS: No  FALLS:  Has patient fallen in last 6 months? Yes. Number of falls 6  LIVING ENVIRONMENT: Lives with: lives with their family Lives in: House/apartment Stairs: No Has following equipment at home: Single point cane  OCCUPATION: disabled  PLOF: Needs assistance with ADLs occassionally  PATIENT GOALS: get my pain manageable  NEXT MD VISIT: 2 more weeks  OBJECTIVE:   DIAGNOSTIC FINDINGS:  Spinal imaging   Left lower back pain with some lower leg pain does not appear to be radicular patient has had frequent falls   X-rays show slight scoliosis in the lumbar L4-5 S1 area with increased lumbar lordosis disc spaces are open and preserved.  Facet joints show mild degenerative changes.   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    PATIENT SURVEYS:  FOTO 45   COGNITION: Overall cognitive status: Within functional limits for tasks assessed      POSTURE: rounded shoulders, forward head, and decreased lumbar lordosis  PALPATION: Tender low back paraspinals  LUMBAR ROM:   AROM eval 10/30/2022  Flexion 60% available * 80% available  Extension 40% available 60%  availalbe  Right lateral flexion    Left lateral flexion    Right rotation    Left rotation     (Blank rows = not tested)   LOWER EXTREMITY MMT:    MMT Right eval Left eval Right 10/30/2022 Left 10/30/2022  Hip flexion 4 4 4+ 5  Hip extension 3- 3- 3- 3-  Hip abduction      Hip adduction      Hip internal rotation      Hip external rotation      Knee flexion 4 4 4 4   Knee extension 5 3  5  4+  Ankle dorsiflexion 5 5 5 5   Ankle plantarflexion      Ankle inversion      Ankle eversion       (Blank rows = not tested)   FUNCTIONAL TESTS:  5 times sit to stand: 20.74   TODAY'S TREATMENT:                                                                                                                           DATE:  11/14/22 LTR 1 x 10 x 5 second holds Bridge 2 x 10  SLR 1 x 10  DKTC with green ball 2 x 10 x 5 second holds Standing row BTB 1x 20  Shoulder extension BTB 2 x 15 Palof press BTB 2 x 10 Calf stretch on slant board 3 x 30 second holds  11/11/21 Supine: LTR x 20 SKTC 5 x 20" Hamstring stretch 5 x 20" Abdominal bracing 5" hold x 10 Hooklying hip abduction with BTB 2 x 10 Hooklying  hip adduction with ball 5" hold x 10 Abdominal bracing with march x 10  Standing: Slant board 5 x 20" BTB scapular retraction 2 x 10   11/07/21 Supine: LTR x 20 Hamstring stretch 5 x 20" each SKTC 5 x 20" each Abdominal bracing 5" x 10 Hip adduction with ball 5" x 15 Hip abduction with BTB 2 x 10 Abdominal bracing with a march 2 x 10   11/01/2022 LTR x 10 Abdominal bracing 5" hold x 10 Bridge 5 " x 10 Abdominal bracing with SLR x 10 each Abdominal bracing with march x 10 Ball squeeze for hip adduction with bridge x 10 Hooklying clam with RTB 2  x 10  Sit to stand from edge of mat 2 x 5 no UE assist  Standing: BTB scapular retrations  BTB shoulder extensions  Heel/toe raises 2 x 10 Slant board 5 x 20" Hip vectors 3" hold x 5 each     PATIENT  EDUCATION:  Education details: 11/14/22: HEP;  EVAL Patient educated on exam findings, POC, scope of PT, HEP. Person educated: Patient Education method: Explanation, Demonstration, and Handouts Education comprehension: verbalized understanding, returned demonstration, verbal cues required, and tactile cues required   HOME EXERCISE PROGRAM:  Access Code: [email or web address] URL: [email or web address] 11/14/22 - Standing Anti-Rotation Press with Anchored Resistance  - 1 x daily - 7 x weekly - 2 sets - 10 reps  11/07/22 SKTC and hamstring stretch 11/01/22 bridge with ball, abdominal bracing with SLR, abdominal bracing with march, hooklying clam with RTB Date: 12/08/2023Exercises - Supine Lower Trunk Rotation  - 2-3 x daily - 7 x weekly - 1 sets - 10 reps - Supine Transversus Abdominis Bracing - Hands on Stomach  - 2-3 x daily - 7 x weekly - 1 sets - 10 reps - Supine Bridge  - 2-3 x daily - 7 x weekly - 1 sets - 10 reps  ASSESSMENT:  CLINICAL IMPRESSION: Began session with previously completed exercises which patient performs with good mechanics without cueing.  Tolerates additional core strengthening exercises well. Added resisted postural strengthening with moderate fatigue noted at EOS. Patient will continue to benefit from physical therapy in order to improve function and reduce impairment.    Eval:Patient is a 47 y.o. male who was seen today for physical therapy evaluation and treatment for low back pain. Patient demonstrates muscle weakness, reduced ROM, and fascial restrictions which are likely contributing to symptoms of pain and are negatively impacting patient ability to perform ADLs and functional mobility tasks. Patient will benefit from skilled physical therapy services to address these deficits to reduce pain and improve level of function with ADLs and functional mobility tasks.   OBJECTIVE IMPAIRMENTS: decreased activity tolerance, decreased balance, decreased endurance,  decreased knowledge of condition, decreased mobility, difficulty walking, decreased ROM, decreased strength, hypomobility, increased fascial restrictions, impaired perceived functional ability, increased muscle spasms, impaired flexibility, obesity, and pain.   ACTIVITY LIMITATIONS: carrying, lifting, bending, sitting, standing, squatting, transfers, and locomotion level  PARTICIPATION LIMITATIONS: meal prep, cleaning, laundry, shopping, community activity, and yard work  [facility] POTENTIAL: Good  CLINICAL DECISION MAKING: Stable/uncomplicated  EVALUATION COMPLEXITY: Low   GOALS: Goals reviewed with patient? No  SHORT TERM GOALS: Target date: 11/13/2021  Patient will be independent with initial HEP  Baseline: Goal status: MET  2.   Patient will report at least 50% improvement in overall symptoms and/or function to demonstrate improved functional mobility  Baseline:  Goal status: IN PROGRESS    LONG TERM GOALS: Target  date: 11/27/2022  Patient will be independent in self management strategies to improve quality of life and functional outcomes.  Baseline:  Goal status: IN PROGRESS  2.   Patient will report at least 75% improvement in overall symptoms and/or function to demonstrate improved functional mobility  Baseline:  Goal status: IN PROGRESS  3.  Patient will increase  leg MMTs to 5/5 without pain to promote return to ambulation community distances with minimal deviation.    Baseline: see above Goal status: IN PROGRESS  4.  Patient will improve FOTO score to predicted value  Baseline: 45 Goal status: IN PROGRESS  5.  Patient will improve 5 times sit to stand score from 20.74 sec to 16 sec to demonstrate improved functional mobility and increased lower extremity strength.  Baseline:  Goal status: IN PROGRESS   PLAN:  PT FREQUENCY: 1-2x/week  PT DURATION: 4 weeks  PLANNED INTERVENTIONS:  Therapeutic exercises, Therapeutic activity, Neuromuscular  re-education, Balance training, Gait training, Patient/Family education, Joint manipulation, Joint mobilization, Stair training, Orthotic/Fit training, DME instructions, Aquatic Therapy, Dry Needling, Electrical stimulation, Spinal manipulation, Spinal mobilization, Cryotherapy, Moist heat, Compression bandaging, scar mobilization, Splintting, Taping, Traction, Ultrasound, Ionotophoresis 4mg /ml Dexamethasone, and Manual therapy .  PLAN FOR NEXT SESSION: progress lumbar mobility and core strengthening as able    7:32 AM, 11/14/22 Mearl Latin PT, DPT Physical Therapist at [facility]

## 2022-11-18 ENCOUNTER — Encounter (HOSPITAL_COMMUNITY): Payer: Medicare Other

## 2022-11-20 ENCOUNTER — Ambulatory Visit (HOSPITAL_COMMUNITY): Payer: Medicare Other

## 2022-11-20 DIAGNOSIS — M545 Low back pain, unspecified: Secondary | ICD-10-CM

## 2022-11-20 DIAGNOSIS — R29898 Other symptoms and signs involving the musculoskeletal system: Secondary | ICD-10-CM

## 2022-11-20 NOTE — Therapy (Signed)
OUTPATIENT PHYSICAL THERAPY THORACOLUMBAR PROGRESS NOTE/DISCHARGE  PHYSICAL THERAPY DISCHARGE SUMMARY  Visits from Start of Care: 6  Current functional level related to goals / functional outcomes: See below   Remaining deficits: See below   Education / Equipment: See below   Patient agrees to discharge. Patient goals were partially met. Patient is being discharged due to  max progress made for LBP; bilateral foot pain limiting further functional progress.       Patient Name: Randall Meadows MRN: [id number] DOB:[birth year], 47 y.o., male Today's Date: 11/20/2022  END OF SESSION:  PT End of Session - 11/20/22 0816     Visit Number 5    Number of Visits 8    Date for PT Re-Evaluation 11/20/22    Authorization Type Medicaid [employer]    Authorization Time Period 8 visits from 10/31/22 to 11/27/22    Authorization - Number of Visits 8    Progress Note Due on Visit 8    Activity Tolerance Patient tolerated treatment well    Behavior During Therapy [facility] for tasks assessed/performed             Past Medical History:  Diagnosis Date   Asthma    Hypertension    Past Surgical History:  Procedure Laterality Date   TONSILLECTOMY     Patient Active Problem List   Diagnosis Date Noted   Mild persistent asthma, uncomplicated 38/75/6433   Seasonal allergic rhinitis due to pollen 10/04/2022   Seasonal allergic conjunctivitis 10/04/2022   ANXIETY 04/03/2009   OBESITY 09/07/2007   HYPERTENSION 09/07/2007   ALLERGIC RHINITIS 09/07/2007   HEADACHE 09/07/2007   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/2007    PCP: Lorene Dy MD, PCP  REFERRING PROVIDER:  Carole Civil, MD [place] [facility] [place]    REFERRING DIAG:  Diagnosis  M54.50 (ICD-10-CM) - Lumbar pain    Rationale for Evaluation and Treatment: Rehabilitation  THERAPY DIAG:  Low back pain, unspecified back pain laterality, unspecified  chronicity, unspecified whether sciatica present  Other symptoms and signs involving the musculoskeletal system  ONSET DATE: about a month  SUBJECTIVE:                                                                                                                                                                                           SUBJECTIVE STATEMENT: Today my back is not hurting.  About "40%" better overall; foot pain 6/10 bilaterally  Eval:Patient reports insidious onset of back pain; mid to low back area; also having left foot pain; saw Dr. Aline Brochure and he did an x-ray of his low  back; showed arthritis  PERTINENT HISTORY:  Bone spur in left foot; small fracture in left lower leg (fibula) per patient diagnosed 3 months ago per x-ray Having oral surgery on Monday 10/07/22 PAIN:  Are you having pain? Yes: NPRS scale: 0/10 Pain location: low back Pain description: sore, aching Aggravating factors: bending over, prolonged sitting Relieving factors: rest, lying down, back supported  PRECAUTIONS: Fall  WEIGHT BEARING RESTRICTIONS: No  FALLS:  Has patient fallen in last 6 months? Yes. Number of falls 6  LIVING ENVIRONMENT: Lives with: lives with their family Lives in: House/apartment Stairs: No Has following equipment at home: Single point cane  OCCUPATION: disabled  PLOF: Needs assistance with ADLs occassionally  PATIENT GOALS: get my pain manageable  NEXT MD VISIT: 2 more weeks  OBJECTIVE:   DIAGNOSTIC FINDINGS:  Spinal imaging   Left lower back pain with some lower leg pain does not appear to be radicular patient has had frequent falls   X-rays show slight scoliosis in the lumbar L4-5 S1 area with increased lumbar lordosis disc spaces are open and preserved.  Facet joints show mild degenerative changes.   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    PATIENT SURVEYS:  FOTO 45   COGNITION: Overall cognitive status: Within functional  limits for tasks assessed      POSTURE: rounded shoulders, forward head, and decreased lumbar lordosis  PALPATION: Tender low back paraspinals  LUMBAR ROM:   AROM eval 10/30/2022  Flexion 60% available * 80% available  Extension 40% available 60% availalbe  Right lateral flexion    Left lateral flexion    Right rotation    Left rotation     (Blank rows = not tested)   LOWER EXTREMITY MMT:    MMT Right eval Left eval Right 10/30/2022 Left 10/30/2022 Right 11/20/22 Left 11/20/22  Hip flexion 4 4 4+ 5 5 5   Hip extension 3- 3- 3- 3- 4 4  Hip abduction        Hip adduction        Hip internal rotation        Hip external rotation        Knee flexion 4 4 4 4  4+ 4+  Knee extension 5 3  5  4+ 5 5  Ankle dorsiflexion 5 5 5 5     Ankle plantarflexion        Ankle inversion        Ankle eversion         (Blank rows = not tested)   FUNCTIONAL TESTS:  5 times sit to stand: 20.74   TODAY'S TREATMENT:                                                                                                                           DATE:  11/20/22 Progress note FOTO 54 5 times sit to stand 13.09 sec MMT's and AROM  Review of HEP  Supine: LTR x 15 Bridge 5" hold x 10  SLR x 10 Active hamstring stretch 5 x 20" Abdominal bracing 5" x 10  Standing: slant board 5 x 30" BTB scapular retractions 2 x 10 BTB shoulder extensions 2 x 10  11/14/22 LTR 1 x 10 x 5 second holds Bridge 2 x 10  SLR 1 x 10  DKTC with green ball 2 x 10 x 5 second holds Standing row BTB 1x 20  Shoulder extension BTB 2 x 15 Palof press BTB 2 x 10 Calf stretch on slant board 3 x 30 second holds  11/11/21 Supine: LTR x 20 SKTC 5 x 20" Hamstring stretch 5 x 20" Abdominal bracing 5" hold x 10 Hooklying hip abduction with BTB 2 x 10 Hooklying hip adduction with ball 5" hold x 10 Abdominal bracing with march x 10  Standing: Slant board 5 x 20" BTB scapular retraction 2 x  10   11/07/21 Supine: LTR x 20 Hamstring stretch 5 x 20" each SKTC 5 x 20" each Abdominal bracing 5" x 10 Hip adduction with ball 5" x 15 Hip abduction with BTB 2 x 10 Abdominal bracing with a march 2 x 10   11/01/2022 LTR x 10 Abdominal bracing 5" hold x 10 Bridge 5 " x 10 Abdominal bracing with SLR x 10 each Abdominal bracing with march x 10 Ball squeeze for hip adduction with bridge x 10 Hooklying clam with RTB 2  x 10  Sit to stand from edge of mat 2 x 5 no UE assist  Standing: BTB scapular retrations  BTB shoulder extensions  Heel/toe raises 2 x 10 Slant board 5 x 20" Hip vectors 3" hold x 5 each     PATIENT EDUCATION:  Education details: 11/14/22: HEP;  EVAL Patient educated on exam findings, POC, scope of PT, HEP. Person educated: Patient Education method: Explanation, Demonstration, and Handouts Education comprehension: verbalized understanding, returned demonstration, verbal cues required, and tactile cues required   HOME EXERCISE PROGRAM:  Access Code: [email or web address] URL: [email or web address] 11/14/22 - Standing Anti-Rotation Press with Anchored Resistance  - 1 x daily - 7 x weekly - 2 sets - 10 reps  11/07/22 SKTC and hamstring stretch 11/01/22 bridge with ball, abdominal bracing with SLR, abdominal bracing with march, hooklying clam with RTB Date: 12/08/2023Exercises - Supine Lower Trunk Rotation  - 2-3 x daily - 7 x weekly - 1 sets - 10 reps - Supine Transversus Abdominis Bracing - Hands on Stomach  - 2-3 x daily - 7 x weekly - 1 sets - 10 reps - Supine Bridge  - 2-3 x daily - 7 x weekly - 1 sets - 10 reps  ASSESSMENT:  CLINICAL IMPRESSION: Progress note today; patient has met 1/2 STG's and 2/5 LTG's.  He is still walking with a limp which is likely contributing to his continued leg weakness and some occasional back pain.  Reports no pain in his back but 6/10 pain bilateral feet.  Patient will discharge at this time; is independent with his  HEP and has made good improvement; feels that his foot pain needs to be addressed before back pain will resolve completely.     Eval:Patient is a 47 y.o. male who was seen today for physical therapy evaluation and treatment for low back pain. Patient demonstrates muscle weakness, reduced ROM, and fascial restrictions which are likely contributing to symptoms of pain and are negatively impacting patient ability to perform ADLs and functional mobility tasks. Patient will benefit from skilled physical therapy services to address these deficits to reduce pain  and improve level of function with ADLs and functional mobility tasks.   OBJECTIVE IMPAIRMENTS: decreased activity tolerance, decreased balance, decreased endurance, decreased knowledge of condition, decreased mobility, difficulty walking, decreased ROM, decreased strength, hypomobility, increased fascial restrictions, impaired perceived functional ability, increased muscle spasms, impaired flexibility, obesity, and pain.   ACTIVITY LIMITATIONS: carrying, lifting, bending, sitting, standing, squatting, transfers, and locomotion level  PARTICIPATION LIMITATIONS: meal prep, cleaning, laundry, shopping, community activity, and yard work  [facility] POTENTIAL: Good  CLINICAL DECISION MAKING: Stable/uncomplicated  EVALUATION COMPLEXITY: Low   GOALS: Goals reviewed with patient? No  SHORT TERM GOALS: Target date: 11/13/2021  Patient will be independent with initial HEP  Baseline: Goal status: MET  2.   Patient will report at least 50% improvement in overall symptoms and/or function to demonstrate improved functional mobility  Baseline: "40%" better 11/20/22 Goal status: IN PROGRESS    LONG TERM GOALS: Target date: 11/27/2022  Patient will be independent in self management strategies to improve quality of life and functional outcomes.  Baseline:  Goal status: MET  2.   Patient will report at least 75% improvement in overall symptoms  and/or function to demonstrate improved functional mobility  Baseline:  Goal status: IN PROGRESS  3.  Patient will increase  leg MMTs to 5/5 without pain to promote return to ambulation community distances with minimal deviation.    Baseline: see above; 11/20/22 improved but continues with weakness hip extension and knee flexion Goal status: IN PROGRESS  4.  Patient will improve FOTO score to predicted value  Baseline: 45; 11/20/22 54 Goal status: IN PROGRESS  5.  Patient will improve 5 times sit to stand score from 20.74 sec to 16 sec to demonstrate improved functional mobility and increased lower extremity strength.  Baseline: 11/20/22 13.09 sec Goal status: MET   PLAN:  PT FREQUENCY: 1-2x/week  PT DURATION: 4 weeks  PLANNED INTERVENTIONS:  Therapeutic exercises, Therapeutic activity, Neuromuscular re-education, Balance training, Gait training, Patient/Family education, Joint manipulation, Joint mobilization, Stair training, Orthotic/Fit training, DME instructions, Aquatic Therapy, Dry Needling, Electrical stimulation, Spinal manipulation, Spinal mobilization, Cryotherapy, Moist heat, Compression bandaging, scar mobilization, Splintting, Taping, Traction, Ultrasound, Ionotophoresis 4mg /ml Dexamethasone, and Manual therapy .  PLAN FOR NEXT SESSION: sees MD 12/06/22; discharge     9:00 AM, 11/20/22 Cherrise Occhipinti Small Dhanya Bogle MPT [place] [facility]  [phone]

## 2022-11-20 NOTE — Addendum Note (Signed)
Addended byDonnal Debar, Cael Worth S on: 11/20/2022 07:05 AM   Modules accepted: Orders

## 2022-11-29 NOTE — Progress Notes (Deleted)
Office Visit Note  Patient: Randall Meadows             Date of Birth: February 17, 1976           MRN: [id number]             PCP: Lorene Dy, MD Referring: Valentina Shaggy, * Visit Date: 12/12/2022 Occupation: @GUAROCC @  Subjective:  No chief complaint on file.   History of Present Illness: Randall Meadows is a 47 y.o. male ***     Activities of Daily Living:  Patient reports morning stiffness for *** {minute/hour:19697}.   Patient {ACTIONS;DENIES/REPORTS:21021675::"Denies"} nocturnal pain.  Difficulty dressing/grooming: {ACTIONS;DENIES/REPORTS:21021675::"Denies"} Difficulty climbing stairs: {ACTIONS;DENIES/REPORTS:21021675::"Denies"} Difficulty getting out of chair: {ACTIONS;DENIES/REPORTS:21021675::"Denies"} Difficulty using hands for taps, buttons, cutlery, and/or writing: {ACTIONS;DENIES/REPORTS:21021675::"Denies"}  No Rheumatology ROS completed.   PMFS History:  Patient Active Problem List   Diagnosis Date Noted   Mild persistent asthma, uncomplicated 09/47/0962   Seasonal allergic rhinitis due to pollen 10/04/2022   Seasonal allergic conjunctivitis 10/04/2022   ANXIETY 04/03/2009   OBESITY 09/07/2007   HYPERTENSION 09/07/2007   ALLERGIC RHINITIS 09/07/2007   HEADACHE 09/07/2007   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/2007    Past Medical History:  Diagnosis Date   Asthma    Hypertension     Family History  Problem Relation Age of Onset   Urticaria Maternal Grandmother    Food Allergy Maternal Grandmother    Allergic rhinitis Son    Allergic rhinitis Son    Past Surgical History:  Procedure Laterality Date   TONSILLECTOMY     Social History   Social History Narrative   Not on file   Immunization History  Administered Date(s) Administered   PFIZER(Purple Top)SARS-COV-2 Vaccination 02/10/2020, 03/06/2020     Objective: Vital Signs: There were no vitals taken for this visit.   Physical Exam   Musculoskeletal Exam:  ***  CDAI Exam: CDAI Score: -- Patient Global: --; Provider Global: -- Swollen: --; Tender: -- Joint Exam 12/12/2022   No joint exam has been documented for this visit   There is currently no information documented on the homunculus. Go to the Rheumatology activity and complete the homunculus joint exam.  Investigation: No additional findings.  Imaging: No results found.  Recent Labs: Lab Results  Component Value Date   WBC 7.6 02/15/2021   HGB 15.8 02/15/2021   PLT 270 02/15/2021   NA 136 02/15/2021   K 4.0 02/15/2021   CL 101 02/15/2021   CO2 27 02/15/2021   GLUCOSE 95 02/15/2021   BUN 15 02/15/2021   CREATININE 1.07 02/15/2021   BILITOT 0.8 01/31/2021   ALKPHOS 80 01/31/2021   AST 22 01/31/2021   ALT 27 01/31/2021   PROT 8.3 (H) 01/31/2021   ALBUMIN 4.2 01/31/2021   CALCIUM 9.3 02/15/2021    Speciality Comments: No specialty comments available.  Procedures:  No procedures performed Allergies: Patient has no known allergies.   Assessment / Plan:     Visit Diagnoses: Positive ANA (antinuclear antibody) - 01/11/22: ANA 1:80 speckled, CRP 16, ESR 12  Elevated C-reactive protein (CRP)  Mild persistent asthma, uncomplicated  Seasonal allergic rhinitis due to pollen  Essential hypertension  History of anxiety  Orders: No orders of the defined types were placed in this encounter.  No orders of the defined types were placed in this encounter.   Face-to-face time spent with patient was *** minutes. Greater than 50% of time was spent in counseling and coordination of care.  Follow-Up  Instructions: No follow-ups on file.   Ofilia Neas, PA-C  Note - This record has been created using Dragon software.  Chart creation errors have been sought, but may not always  have been located. Such creation errors do not reflect on  the standard of medical care.,

## 2022-12-06 ENCOUNTER — Ambulatory Visit (INDEPENDENT_AMBULATORY_CARE_PROVIDER_SITE_OTHER): Payer: Medicare Other | Admitting: Orthopedic Surgery

## 2022-12-06 DIAGNOSIS — M545 Low back pain, unspecified: Secondary | ICD-10-CM | POA: Diagnosis not present

## 2022-12-06 DIAGNOSIS — M5410 Radiculopathy, site unspecified: Secondary | ICD-10-CM | POA: Diagnosis not present

## 2022-12-06 DIAGNOSIS — M7671 Peroneal tendinitis, right leg: Secondary | ICD-10-CM

## 2022-12-06 DIAGNOSIS — M722 Plantar fascial fibromatosis: Secondary | ICD-10-CM

## 2022-12-06 NOTE — Progress Notes (Signed)
Chief Complaint  Patient presents with   Follow-up    Recheck on Back pain   Encounter Diagnoses  Name Primary?   Plantar fasciitis of left foot    Radicular leg pain    Lumbar pain    Peroneal tendonitis of right lower extremity Yes   Assessment and plan  47 year old male who we are treating for plantar fasciitis left foot as well as radicular leg pain left leg with lower back pain who presents with new complaints of pain on the posterior aspect of the right fibula with no history of trauma  Currently in stable condition back pain has gotten significantly better, he still having some pain in the heel area which radiates up into the knee.  Unclear at this time whether this is part of the radicular syndrome or the fasciitis.  We will continue with current treatment  Right ankle appears to have peroneal tendinitis recommend ankle exercises  Follow-up in 3 months  Physical Exam Vitals and nursing note reviewed.  Constitutional:      Appearance: Normal appearance.  HENT:     Head: Normocephalic and atraumatic.  Eyes:     General: No scleral icterus.       Right eye: No discharge.        Left eye: No discharge.     Extraocular Movements: Extraocular movements intact.     Conjunctiva/sclera: Conjunctivae normal.     Pupils: Pupils are equal, round, and reactive to light.  Cardiovascular:     Rate and Rhythm: Normal rate.     Pulses: Normal pulses.  Musculoskeletal:     Comments: Right ankle  Full range of motion.  No pain with peroneal tendon resistance testing.  Normal stability on the anterior drawer test.  Tenderness over the posterior tendons behind the fibula consistent with peroneal tendinitis  Left heel no plantar fascial tenderness today.  No tenderness in the shin medially or laterally    Skin:    General: Skin is warm and dry.     Capillary Refill: Capillary refill takes less than 2 seconds.  Neurological:     General: No focal deficit present.     Mental  Status: He is alert and oriented to person, place, and time.  Psychiatric:        Mood and Affect: Mood normal.        Behavior: Behavior normal.        Thought Content: Thought content normal.        Judgment: Judgment normal.

## 2022-12-06 NOTE — Patient Instructions (Signed)
Instructions ° °1.  You have sustained an ankle sprain, or similar exercises that can be treated as an ankle sprain.  **These exercises can also be used as part of recovery from an ankle fracture.  °2.  I encourage you to stay on your feet and gradually remove your walking boot.   °3.  Below are some exercises that you can complete on your own to improve your symptoms.  °4.  As an alternative, you can search for ankle sprain exercises online, and can see some demonstrations on YouTube  °5.  If you are having difficulty with these exercises, we can also prescribe formal physical therapy ° °Ankle Exercises °Ask your health care provider which exercises are safe for you. Do exercises exactly as told by your health care provider and adjust them as directed. It is normal to feel mild stretching, pulling, tightness, or mild discomfort as you do these exercises. Stop right away if you feel sudden pain or your pain gets worse. Do not begin these exercises until told by your health care provider. ° °Stretching and range-of-motion exercises °These exercises warm up your muscles and joints and improve the movement and flexibility of your ankle. These exercises may also help to relieve pain. ° °Dorsiflexion/plantar flexion ° °Sit with your R knee straight or bent. Do not rest your foot on anything. °Flex your left ankle to tilt the top of your foot toward your shin. This is called dorsiflexion. °Hold this position for 5 seconds. °Point your toes downward to tilt the top of your foot away from your shin. This is called plantar flexion. °Hold this position for 5 seconds. °Repeat 10 times. Complete this exercise 2-3 times a day.  As tolerated ° °Ankle alphabet ° °Sit with your R foot supported at your lower leg. °Do not rest your foot on anything. °Make sure your foot has room to move freely. °Think of your R foot as a paintbrush: °Move your foot to trace each letter of the alphabet in the air. Keep your hip and knee still while  you trace the letters. Trace every letter from A to Z. °Make the letters as large as you can without causing or increasing any discomfort. ° °Repeat 2-3 times. Complete this exercise 2-3 times a day. ° ° °Strengthening exercises °These exercises build strength and endurance in your ankle. Endurance is the ability to use your muscles for a long time, even after they get tired. °Dorsiflexors °These are muscles that lift your foot up. °Secure a rubber exercise band or tube to an object, such as a table leg, that will stay still when the band is pulled. Secure the other end around your R foot. °Sit on the floor, facing the object with your R leg extended. The band or tube should be slightly tense when your foot is relaxed. °Slowly flex your R ankle and toes to bring your foot toward your shin. °Hold this position for 5 seconds. °Slowly return your foot to the starting position, controlling the band as you do that. °Repeat 10 times. Complete this exercise 2-3 times a day. ° °Plantar flexors °These are muscles that push your foot down. °Sit on the floor with your R leg extended. °Loop a rubber exercise band or tube around the ball of your R foot. The ball of your foot is on the walking surface, right under your toes. The band or tube should be slightly tense when your foot is relaxed. °Slowly point your toes downward, pushing them away from   you. °Hold this position for 5 seconds. °Slowly release the tension in the band or tube, controlling smoothly until your foot is back in the starting position. °Repeat 10 times. Complete this exercise 2-3 times a day. ° °Towel curls ° °Sit in a chair on a non-carpeted surface, and put your feet on the floor. °Place a towel in front of your feet. °Keeping your heel on the floor, put your R foot on the towel. °Pull the towel toward you by grabbing the towel with your toes and curling them under. Keep your heel on the floor. °Let your toes relax. °Grab the towel again. Keep pulling the  towel until it is completely underneath your foot. °Repeat 10 times. Complete this exercise 2-3 times a day. ° °Standing plantar flexion °This is an exercise in which you use your toes to lift your body's weight while standing. °Stand with your feet shoulder-width apart. °Keep your weight spread evenly over the width of your feet while you rise up on your toes. Use a wall or table to steady yourself if needed, but try not to use it for support. °If this exercise is too easy, try these options: °Shift your weight toward your R leg until you feel challenged. °If told by your health care provider, lift your uninjured leg off the floor. °Hold this position for 5 seconds. °Repeat 10 times. Complete this exercise 2-3 times a day. ° °Tandem walking °Stand with one foot directly in front of the other. °Slowly raise your back foot up, lifting your heel before your toes, and place it directly in front of your other foot. °Continue to walk in this heel-to-toe way. Have a countertop or wall nearby to use if needed to keep your balance, but try not to hold onto anything for support. ° °Repeat 10 times. Complete this exercise 2-3 times a day. ° ° °Document Revised: 07/11/2018 Document Reviewed: 07/13/2018 °Elsevier Patient Education © 2020 [employer] ° °

## 2022-12-12 ENCOUNTER — Ambulatory Visit: Payer: Medicare Other | Admitting: Rheumatology

## 2022-12-12 DIAGNOSIS — R768 Other specified abnormal immunological findings in serum: Secondary | ICD-10-CM

## 2022-12-12 DIAGNOSIS — J301 Allergic rhinitis due to pollen: Secondary | ICD-10-CM

## 2022-12-12 DIAGNOSIS — Z8659 Personal history of other mental and behavioral disorders: Secondary | ICD-10-CM

## 2022-12-12 DIAGNOSIS — J4530 Mild persistent asthma, uncomplicated: Secondary | ICD-10-CM

## 2022-12-12 DIAGNOSIS — I10 Essential (primary) hypertension: Secondary | ICD-10-CM

## 2022-12-12 DIAGNOSIS — R7982 Elevated C-reactive protein (CRP): Secondary | ICD-10-CM

## 2022-12-26 ENCOUNTER — Encounter: Payer: Self-pay | Admitting: Radiology

## 2023-01-07 ENCOUNTER — Other Ambulatory Visit: Payer: Self-pay | Admitting: Orthopedic Surgery

## 2023-01-07 DIAGNOSIS — M5410 Radiculopathy, site unspecified: Secondary | ICD-10-CM

## 2023-01-07 DIAGNOSIS — M545 Low back pain, unspecified: Secondary | ICD-10-CM

## 2023-01-10 ENCOUNTER — Ambulatory Visit: Payer: Medicare Other | Admitting: Rheumatology

## 2023-03-04 ENCOUNTER — Other Ambulatory Visit: Payer: Self-pay | Admitting: Allergy & Immunology

## 2023-03-06 ENCOUNTER — Ambulatory Visit: Payer: Medicare Other | Admitting: Orthopedic Surgery

## 2023-03-14 ENCOUNTER — Encounter: Payer: Self-pay | Admitting: Orthopedic Surgery

## 2023-03-14 ENCOUNTER — Ambulatory Visit: Payer: Medicare Other | Admitting: Orthopedic Surgery

## 2023-03-14 DIAGNOSIS — M7671 Peroneal tendinitis, right leg: Secondary | ICD-10-CM

## 2023-03-14 DIAGNOSIS — M5410 Radiculopathy, site unspecified: Secondary | ICD-10-CM | POA: Diagnosis not present

## 2023-03-14 DIAGNOSIS — M722 Plantar fascial fibromatosis: Secondary | ICD-10-CM | POA: Diagnosis not present

## 2023-03-14 DIAGNOSIS — M545 Low back pain, unspecified: Secondary | ICD-10-CM | POA: Diagnosis not present

## 2023-03-14 NOTE — Addendum Note (Signed)
Addended byCaffie Damme on: 03/14/2023 11:35 AM   Modules accepted: Orders

## 2023-03-14 NOTE — Patient Instructions (Addendum)
Continue cyclobenzaprine, gabapentin, ibuprofen and tramadol  You will be scheduled for MRI of the lumbar spine  I will call you to relay the results to you and determine the next step  While we are working on your approval for MRI please go ahead and call to schedule your appointment with [facility] within at least one (1) week.   Central Scheduling [phone]

## 2023-03-14 NOTE — Progress Notes (Signed)
Chief Complaint  Patient presents with   Back Pain    Increased pain past week with back and foot    Foot Pain    Right    Encounter Diagnoses  Name Primary?   Lumbar pain Yes   Radicular leg pain    Plantar fasciitis of left foot    Peroneal tendonitis of right lower extremity      47 year old male with multiple orthopedic issues.  We have been seeing him for back pain radicular leg pain and some planta fasciitis of the left foot some peroneal tendinitis and possible lumbar related lateral foot pain  He has increased pain over the last 2 to 3 weeks thinks it might be related to weather related  He went to physical therapy has been doing home exercises he takes ibuprofen cyclobenzaprine and tramadol which have not been relieving his pain  His pain has been present now for over 8 months  Physical Exam Vitals and nursing note reviewed.  Constitutional:      Appearance: Normal appearance.  HENT:     Head: Normocephalic and atraumatic.  Eyes:     General: No scleral icterus.       Right eye: No discharge.        Left eye: No discharge.     Extraocular Movements: Extraocular movements intact.     Conjunctiva/sclera: Conjunctivae normal.     Pupils: Pupils are equal, round, and reactive to light.  Cardiovascular:     Rate and Rhythm: Normal rate.     Pulses: Normal pulses.  Musculoskeletal:     Comments: Tenderness in the lumbar spine primarily L5-S1 L4-L5 and then to the right and left of the midline.  He has a positive left straight leg raise but normal sensation and strength in the left lower extremity  Skin:    General: Skin is warm and dry.     Capillary Refill: Capillary refill takes less than 2 seconds.  Neurological:     General: No focal deficit present.     Mental Status: He is alert and oriented to person, place, and time.  Psychiatric:        Mood and Affect: Mood normal.        Behavior: Behavior normal.        Thought Content: Thought content normal.         Judgment: Judgment normal.    Encounter Diagnoses  Name Primary?   Lumbar pain Yes   Radicular leg pain    Plantar fasciitis of left foot    Peroneal tendonitis of right lower extremity     At this point with 8 months of pain symptoms a trial of physical therapy home exercises and medication that is warranted to get an MRI to assess his neural elements and determine further treatment recommendations.  He should continue his current medications at this time which include tramadol, ibuprofen and cyclobenzaprine and gabapentin

## 2023-03-19 LAB — LAB REPORT - SCANNED
A1c: 6.2
PSA, Total: 1.29

## 2023-04-04 ENCOUNTER — Ambulatory Visit: Payer: Medicare Other | Admitting: Allergy & Immunology

## 2023-04-04 ENCOUNTER — Other Ambulatory Visit: Payer: Self-pay | Admitting: Cardiology

## 2023-04-04 NOTE — Telephone Encounter (Signed)
Rx request sent to pharmacy.  

## 2023-04-07 NOTE — Progress Notes (Deleted)
   [address] [place] [address] Dept: [phone]  FOLLOW UP NOTE  Patient ID: Randall Meadows, male    DOB: 1976-09-06  Age: 47 y.o. MRN: [id number] Date of Office Visit: 04/08/2023  Assessment  Chief Complaint: No chief complaint on file.  HPI Randall Meadows is a 47 year old male who presents to the clinic for follow-up visit.  He was last seen in this clinic on 10/04/2022 by Thermon Leyland, FNP, for evaluation of asthma, allergic rhinitis, and allergic conjunctivitis.  His last environmental allergy testing was on 10/10/2021 and was positive to grass pollen, weed pollen, ragweed pollen, and tree pollen   Drug Allergies:  No Known Allergies  Physical Exam: There were no vitals taken for this visit.   Physical Exam  Diagnostics:    Assessment and Plan: No diagnosis found.  No orders of the defined types were placed in this encounter.   There are no Patient Instructions on file for this visit.  No follow-ups on file.    Thank you for the opportunity to care for this patient.  Please do not hesitate to contact me with questions.  Thermon Leyland, FNP [facility]

## 2023-04-08 ENCOUNTER — Ambulatory Visit: Payer: Medicare Other | Admitting: Family Medicine

## 2023-04-11 ENCOUNTER — Ambulatory Visit (INDEPENDENT_AMBULATORY_CARE_PROVIDER_SITE_OTHER): Payer: Medicare Other | Admitting: Allergy & Immunology

## 2023-04-11 ENCOUNTER — Other Ambulatory Visit: Payer: Self-pay | Admitting: Family Medicine

## 2023-04-11 ENCOUNTER — Encounter: Payer: Self-pay | Admitting: Allergy & Immunology

## 2023-04-11 VITALS — BP 118/76 | HR 86 | Temp 97.9°F | Resp 16

## 2023-04-11 DIAGNOSIS — J4530 Mild persistent asthma, uncomplicated: Secondary | ICD-10-CM | POA: Diagnosis not present

## 2023-04-11 DIAGNOSIS — J301 Allergic rhinitis due to pollen: Secondary | ICD-10-CM | POA: Diagnosis not present

## 2023-04-11 MED ORDER — AIRSUPRA 90-80 MCG/ACT IN AERO: 2.0000 | INHALATION_SPRAY | RESPIRATORY_TRACT | 2 refills | Status: DC | PRN

## 2023-04-11 MED ORDER — FLUTICASONE PROPIONATE 50 MCG/ACT NA SUSP: 1.0000 | Freq: Every day | NASAL | 5 refills | Status: DC

## 2023-04-11 MED ORDER — BUDESONIDE-FORMOTEROL FUMARATE 160-4.5 MCG/ACT IN AERO: 2.0000 | INHALATION_SPRAY | Freq: Two times a day (BID) | RESPIRATORY_TRACT | 5 refills | Status: DC

## 2023-04-11 NOTE — Patient Instructions (Addendum)
1. Moderate persistent asthma, uncomplicated - Lung testing looked great today. - We are going to change you to the higher dose Symbicort to see if this helps at all.  - We are also going to change you from albuterol to AirSupra (contains albuterol plus an inhaled steroid to provide more antiinflammatory activity). - There is a $0 copay card.  - Daily controller medication(s): Symbicort 160/4.43mcg two puffs twice daily with spacer - Prior to physical activity: AirSupra 2 puffs 10-15 minutes before physical activity. - Rescue medications: AirSupra 2 puffs every 4-6 hours as needed - Asthma control goals:  * Full participation in all desired activities (may need albuterol before activity) * Albuterol use two time or less a week on average (not counting use with activity) * Cough interfering with sleep two time or less a month * Oral steroids no more than once a year * No hospitalizations  2. Seasonal allergic rhinitis due to pollen (grasses, ragweed, weeds, and trees.) - Continue taking: Zyrtec (cetirizine) 10mg  tablet once daily (or Allegra) and Flonase (fluticasone) one spray per nostril daily - You can use an extra dose of the antihistamine, if needed, for breakthrough symptoms.  - Consider nasal saline rinses 1-2 times daily to remove allergens from the nasal cavities as well as help with mucous clearance (this is especially helpful to do before the nasal sprays are given) - Let's hold off on allergy shots.   3. Return in about 3 months (around 07/12/2023) since we are making some medication changes.    Please inform us of any Emergency Department visits, hospitalizations, or changes in symptoms. Call us before going to the ED for breathing or allergy symptoms since we might be able to fit you in for a sick visit. Feel free to contact us anytime with any questions, problems, or concerns.  It was a pleasure to see you today!  Websites that have reliable patient information: 1. American  Academy of Asthma, Allergy, and Immunology: www.aaaai.org 2. Food Allergy Research and Education (FARE): foodallergy.org 3. Mothers of Asthmatics: http://www.asthmacommunitynetwork.org 4. American College of Allergy, Asthma, and Immunology: www.acaai.org   COVID-19 Vaccine Information can be found at: [email or web address].[email or web address] For questions related to vaccine distribution or appointments, please email [email or web address] or call [phone].   We realize that you might be concerned about having an allergic reaction to the COVID19 vaccines. To help with that concern, WE ARE OFFERING THE COVID19 VACCINES IN OUR OFFICE! Ask the front desk for dates!     "Like" [place] on Facebook and Instagram for our latest updates!      A healthy democracy works best when [employer] participate! Make sure you are registered to vote! If you have moved or changed any of your contact information, you will need to get this updated before voting!  In some cases, you MAY be able to register to vote online: [email or web address]

## 2023-04-11 NOTE — Progress Notes (Signed)
FOLLOW UP  Date of Service/Encounter:  04/11/23   Assessment:   Mild intermittent asthma, uncomplicated - increasing to the higher dose Symbicort today to see if this controls his SOB a bit more effectively   Seasonal allergic rhinitis due to pollen (grasses, ragweed, weeds, trees) - did not do intradermal testing since he is not interested in allergen immunotherapy   Negative cardiac work-up - with continued intermittent chest pain that is responsive to albuterol   Costochondritis and joint pain (especially left ankle) - with mildly elevated ANA (1:80)   Multiple social stressors   Plan/Recommendations:   1. Moderate persistent asthma, uncomplicated - Lung testing looked great today. - We are going to change you to the higher dose Symbicort to see if this helps at all.  - We are also going to change you from albuterol to AirSupra (contains albuterol plus an inhaled steroid to provide more antiinflammatory activity). - There is a $0 copay card.  - Daily controller medication(s): Symbicort 160/4.48mcg two puffs twice daily with spacer - Prior to physical activity: AirSupra 2 puffs 10-15 minutes before physical activity. - Rescue medications: AirSupra 2 puffs every 4-6 hours as needed - Asthma control goals:  * Full participation in all desired activities (may need albuterol before activity) * Albuterol use two time or less a week on average (not counting use with activity) * Cough interfering with sleep two time or less a month * Oral steroids no more than once a year * No hospitalizations  2. Seasonal allergic rhinitis due to pollen (grasses, ragweed, weeds, and trees.) - Continue taking: Zyrtec (cetirizine) 10mg  tablet once daily (or Allegra) and Flonase (fluticasone) one spray per nostril daily - You can use an extra dose of the antihistamine, if needed, for breakthrough symptoms.  - Consider nasal saline rinses 1-2 times daily to remove allergens from the nasal cavities as  well as help with mucous clearance (this is especially helpful to do before the nasal sprays are given) - Let's hold off on allergy shots.   3. Return in about 3 months (around 07/12/2023) since we are making some medication changes.   Subjective:   Randall Meadows is a 47 y.o. 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  Lequan is a 47 y.o. male presenting for a follow up visit.  He was last seen by Thurston Hole one of our esteemed nurse practitioners in December 2023.  At that time, he was continued on Symbicort 80 mcg 2 puffs twice daily and albuterol as needed.  For his allergic rhinitis, he was continued on cetirizine and Flonase.  He was also continued on Pataday as needed.  His last visit, he has done fairly well.   Asthma/Respiratory Symptom History: He has been having chest pain. Albuterol helps with the pain.  He continues to see Cardiology. He has pain that is mid line and moving the left side. He sees Cardiology  as needed. The muscles around his heart "tighten", but not the heart itself. This is all caused by stress from his kids and grandmother. His kids are 19 and 22. They are not listening. Rescue inhaler does sometimes help with his chest pain. He was having chest pains frequently last week and the albuterol was helping. He also is on metoprolol half in the morning and half at night.   His mother does step in  occasionally to help with his grandmother. She is not living with him, but he goes over there at least every day to help with things.   Allergic Rhinitis Symptom History: He is using the Flonase and the cetirizine. This is working well to control his symptoms. He has not been on antibiotics at all for his symptoms. He does not feel that allergy shots are needed.   She is seeing Dr. Romeo Apple for the foot pain. He is being treated with gabapentin. He is getting an MRI next month.   His two boys are driving him nuts. They are refusing their ADHD medications and they are not doing great in school. He is slightly stressed about this. His wife has also had some health problems.   Otherwise, there have been no changes to his past medical history, surgical history, family history, or social history.    Review of Systems  Constitutional: Negative.  Negative for chills, fever, malaise/fatigue and weight loss.  HENT:  Negative for congestion, ear discharge, ear pain and sinus pain.   Eyes:  Negative for pain, discharge and redness.  Respiratory:  Positive for shortness of breath. Negative for cough, sputum production and wheezing.   Cardiovascular: Negative.  Negative for chest pain and palpitations.  Gastrointestinal:  Negative for abdominal pain, constipation, diarrhea, heartburn, nausea and vomiting.  Skin: Negative.  Negative for itching and rash.  Neurological:  Negative for dizziness and headaches.  Endo/Heme/Allergies:  Negative for environmental allergies. Does not bruise/bleed easily.       Objective:   Blood pressure 118/76, pulse 86, temperature 97.9 F (36.6 C), temperature source Temporal, resp. rate 16, SpO2 96 %. There is no height or weight on file to calculate BMI.    Physical Exam Vitals reviewed.  Constitutional:      Appearance: He is well-developed.     Comments: Very talkative as always.   HENT:     Head: Normocephalic and atraumatic.     Right Ear: Tympanic  membrane, ear canal and external ear normal. No drainage, swelling or tenderness. Tympanic membrane is not injected, scarred, erythematous, retracted or bulging.     Left Ear: Tympanic membrane, ear canal and external ear normal. No drainage, swelling or tenderness. Tympanic membrane is not injected, scarred, erythematous, retracted or bulging.     Nose: No nasal deformity, septal deviation, mucosal edema or rhinorrhea.     Right Turbinates: Enlarged, swollen and pale.     Left Turbinates: Enlarged, swollen and pale.     Right Sinus: No maxillary sinus tenderness or frontal sinus tenderness.     Left Sinus: No maxillary sinus tenderness or frontal sinus tenderness.     Mouth/Throat:     Mouth: Mucous membranes are not pale and not dry.     Pharynx: Uvula midline.  Eyes:     General: Allergic shiner present.        Right eye: No discharge.        Left eye: No discharge.     Conjunctiva/sclera: Conjunctivae normal.     Right  eye: Right conjunctiva is not injected. No chemosis.    Left eye: Left conjunctiva is not injected. No chemosis.    Pupils: Pupils are equal, round, and reactive to light.  Cardiovascular:     Rate and Rhythm: Normal rate and regular rhythm.     Heart sounds: Normal heart sounds.  Pulmonary:     Effort: Pulmonary effort is normal. No tachypnea, accessory muscle usage or respiratory distress.     Breath sounds: Normal breath sounds. No wheezing, rhonchi or rales.     Comments: Moving air well in all lung fields.  No increased work of breathing. Chest:     Chest wall: No tenderness.  Abdominal:     Tenderness: There is no abdominal tenderness. There is no guarding or rebound.  Musculoskeletal:     Comments: Tenderness over the dorsum of the left foot.   Lymphadenopathy:     Head:     Right side of head: No submandibular, tonsillar or occipital adenopathy.     Left side of head: No submandibular, tonsillar or occipital adenopathy.     Cervical: No cervical  adenopathy.  Skin:    General: Skin is warm.     Capillary Refill: Capillary refill takes less than 2 seconds.     Coloration: Skin is not pale.     Findings: No abrasion, erythema, petechiae or rash. Rash is not papular, urticarial or vesicular.     Comments: There are multiple insect bites over the bilateral lower extremities.   Neurological:     Mental Status: He is alert.  Psychiatric:        Behavior: Behavior is cooperative.      Diagnostic studies:    Spirometry: results normal (FEV1: 2.80/83%, FVC: 3.61/85%, FEV1/FVC: 78%).    Spirometry consistent with normal pattern.   Allergy Studies: none       Malachi Bonds, MD  [facility]

## 2023-04-15 ENCOUNTER — Telehealth: Payer: Self-pay

## 2023-04-15 ENCOUNTER — Ambulatory Visit (HOSPITAL_COMMUNITY)
Admission: RE | Admit: 2023-04-15 | Discharge: 2023-04-15 | Disposition: A | Discharge status: Home / Self Care | Payer: Medicare Other | Source: Ambulatory Visit | Attending: Orthopedic Surgery | Admitting: Orthopedic Surgery

## 2023-04-15 ENCOUNTER — Encounter (HOSPITAL_COMMUNITY): Payer: Self-pay

## 2023-04-15 ENCOUNTER — Other Ambulatory Visit (HOSPITAL_COMMUNITY): Payer: Self-pay

## 2023-04-15 DIAGNOSIS — M545 Low back pain, unspecified: Secondary | ICD-10-CM

## 2023-04-15 DIAGNOSIS — M5410 Radiculopathy, site unspecified: Secondary | ICD-10-CM

## 2023-04-15 NOTE — Telephone Encounter (Signed)
Patient Advocate Encounter  Currently unable to submit PA. Patient [employer] card on file (scanned in 10-30-2022) shows that patient is not found for coverage. Tried processing with patient's [facility] but it says to run with primary payor and that patient is Medicare Part D eligible.   Is it possible patient has a new Medicare Card?   If patient does NOT have any primary insurance, he will need to contact his case worker with Medicaid to have it removed from his account.

## 2023-04-15 NOTE — Telephone Encounter (Signed)
Patient Advocate Encounter   Received notification from [facility] [employer] that prior authorization is required for Airsupra 90-80MCG/ACT aerosol   Submitted: n/a [employer]  Awaiting clinical questions to generate

## 2023-04-15 NOTE — Telephone Encounter (Signed)
Attempted to call the patient, there was no answer and voicemail was full, will need to attempt to call again.

## 2023-04-16 NOTE — Telephone Encounter (Signed)
Attempted to call patient and it went straight to voicemail which was full. Will need to attempt to call again.

## 2023-04-18 ENCOUNTER — Telehealth: Payer: Self-pay | Admitting: Radiology

## 2023-04-18 ENCOUNTER — Encounter: Payer: Self-pay | Admitting: *Deleted

## 2023-04-18 DIAGNOSIS — M5410 Radiculopathy, site unspecified: Secondary | ICD-10-CM

## 2023-04-18 DIAGNOSIS — M545 Low back pain, unspecified: Secondary | ICD-10-CM

## 2023-04-18 NOTE — Addendum Note (Signed)
Addended byCaffie Damme on: 04/18/2023 11:37 AM   Modules accepted: Orders

## 2023-04-18 NOTE — Telephone Encounter (Signed)
Attempted to call patient again and it went straight to voicemail. MyChart message sent to patient.

## 2023-04-18 NOTE — Telephone Encounter (Signed)
Patient went for MRI at [facility], cannot have it done.  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  Please call patient to discuss.

## 2023-04-18 NOTE — Telephone Encounter (Signed)
I called him to advise CT ordered call to schedule, he voiced understanding still has the number to call  While we are working on your approval for CT please go ahead and call to schedule your appointment with [facility] within at least one (1) week.   Central Scheduling [phone]

## 2023-04-18 NOTE — Telephone Encounter (Signed)
Please call patient at [phone]

## 2023-04-21 ENCOUNTER — Other Ambulatory Visit (HOSPITAL_COMMUNITY): Payer: Self-pay

## 2023-04-25 ENCOUNTER — Ambulatory Visit (HOSPITAL_COMMUNITY): Payer: Medicare Other

## 2023-04-25 NOTE — Progress Notes (Deleted)
Office Visit Note  Patient: Randall Meadows             Date of Birth: 11-13-1975           MRN: [id number]             PCP: Burton Apley, MD Referring: Alfonse Spruce, * Visit Date: 05/07/2023 Occupation: @GUAROCC @  Subjective:  No chief complaint on file.   History of Present Illness: Randall Meadows is a 47 y.o. male ***     Activities of Daily Living:  Patient reports morning stiffness for *** {minute/hour:19697}.   Patient {ACTIONS;DENIES/REPORTS:21021675::"Denies"} nocturnal pain.  Difficulty dressing/grooming: {ACTIONS;DENIES/REPORTS:21021675::"Denies"} Difficulty climbing stairs: {ACTIONS;DENIES/REPORTS:21021675::"Denies"} Difficulty getting out of chair: {ACTIONS;DENIES/REPORTS:21021675::"Denies"} Difficulty using hands for taps, buttons, cutlery, and/or writing: {ACTIONS;DENIES/REPORTS:21021675::"Denies"}  No Rheumatology ROS completed.   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   Physical Exam    Musculoskeletal Exam: ***  CDAI Exam: CDAI Score: -- Patient Global: --; Provider Global: -- Swollen: --; Tender: -- Joint Exam 05/07/2023   No joint exam has been documented for this visit   There is currently no information documented on the homunculus. Go to the Rheumatology activity and complete the homunculus joint exam.  Investigation: No additional findings.  Imaging: No results found.  Recent Labs: Lab Results  Component Value Date   WBC 7.6 02/15/2021   HGB 15.8 02/15/2021   PLT 270 02/15/2021   NA 136 02/15/2021   K 4.0 02/15/2021   CL 101 02/15/2021   CO2 27 02/15/2021   GLUCOSE 95 02/15/2021   BUN 15 02/15/2021   CREATININE 1.07 02/15/2021   BILITOT 0.8 01/31/2021   ALKPHOS 80 01/31/2021   AST 22 01/31/2021   ALT 27 01/31/2021   PROT 8.3 (H) 01/31/2021   ALBUMIN 4.2 01/31/2021   CALCIUM 9.3 02/15/2021    Speciality Comments: No specialty comments available.  Procedures:  No procedures performed Allergies: Patient has no known allergies.   Assessment / Plan:     Visit Diagnoses: Positive ANA (antinuclear antibody) - 01/11/22: ANA 1:80 speckled, CRP 16, ESR 12  Elevated C-reactive protein (CRP) - CRP 16 on 01/11/22  Essential hypertension  Seasonal allergic rhinitis due to pollen  Mild persistent asthma, uncomplicated  Seasonal allergic conjunctivitis  History of anxiety  Orders: No orders of the defined types were placed in this encounter.  No orders of the defined types were placed in this encounter.   Face-to-face time spent with patient was *** minutes. Greater than 50% of time  was spent in counseling and coordination of care.  Follow-Up Instructions: No follow-ups on file.   Ofilia Neas, PA-C  Note - This record has been created using Dragon software.  Chart creation errors have been sought, but may not always  have been located. Such creation errors do not reflect on  the standard of medical care.

## 2023-04-29 ENCOUNTER — Other Ambulatory Visit: Payer: Self-pay | Admitting: Orthopedic Surgery

## 2023-04-29 DIAGNOSIS — M545 Low back pain, unspecified: Secondary | ICD-10-CM

## 2023-04-29 DIAGNOSIS — M5410 Radiculopathy, site unspecified: Secondary | ICD-10-CM

## 2023-05-02 ENCOUNTER — Telehealth: Payer: Self-pay | Admitting: Orthopedic Surgery

## 2023-05-02 NOTE — Telephone Encounter (Signed)
VOID

## 2023-05-07 ENCOUNTER — Encounter: Payer: Medicare Other | Admitting: Rheumatology

## 2023-05-07 DIAGNOSIS — J301 Allergic rhinitis due to pollen: Secondary | ICD-10-CM

## 2023-05-07 DIAGNOSIS — I10 Essential (primary) hypertension: Secondary | ICD-10-CM

## 2023-05-07 DIAGNOSIS — J4530 Mild persistent asthma, uncomplicated: Secondary | ICD-10-CM

## 2023-05-07 DIAGNOSIS — R7982 Elevated C-reactive protein (CRP): Secondary | ICD-10-CM

## 2023-05-07 DIAGNOSIS — H1010 Acute atopic conjunctivitis, unspecified eye: Secondary | ICD-10-CM

## 2023-05-07 DIAGNOSIS — Z8659 Personal history of other mental and behavioral disorders: Secondary | ICD-10-CM

## 2023-05-07 DIAGNOSIS — R768 Other specified abnormal immunological findings in serum: Secondary | ICD-10-CM

## 2023-05-08 ENCOUNTER — Other Ambulatory Visit: Payer: Self-pay

## 2023-05-08 NOTE — Telephone Encounter (Signed)
I have jeff the drug rep looking into this for [place]

## 2023-05-08 NOTE — Telephone Encounter (Signed)
Card on file for [employer] shows [employer] MEDICARE not MEDICAID. Paulene Floor is not covered by plan.

## 2023-05-28 ENCOUNTER — Ambulatory Visit: Payer: Medicare Other | Admitting: Rheumatology

## 2023-06-18 ENCOUNTER — Ambulatory Visit (HOSPITAL_COMMUNITY)
Admission: RE | Admit: 2023-06-18 | Discharge: 2023-06-18 | Disposition: A | Discharge status: Home / Self Care | Payer: Medicare Other | Source: Ambulatory Visit | Attending: Orthopedic Surgery | Admitting: Orthopedic Surgery

## 2023-06-18 DIAGNOSIS — M5410 Radiculopathy, site unspecified: Secondary | ICD-10-CM | POA: Insufficient documentation

## 2023-06-18 DIAGNOSIS — M545 Low back pain, unspecified: Secondary | ICD-10-CM | POA: Diagnosis not present

## 2023-07-03 ENCOUNTER — Ambulatory Visit (INDEPENDENT_AMBULATORY_CARE_PROVIDER_SITE_OTHER): Payer: Medicare Other | Admitting: Orthopedic Surgery

## 2023-07-03 ENCOUNTER — Encounter: Payer: Self-pay | Admitting: Orthopedic Surgery

## 2023-07-03 VITALS — BP 122/87 | HR 83 | Ht 70.0 in | Wt 274.0 lb

## 2023-07-03 DIAGNOSIS — M545 Low back pain, unspecified: Secondary | ICD-10-CM | POA: Diagnosis not present

## 2023-07-03 NOTE — Progress Notes (Signed)
   VISIT TYPE: FOLLOW UP   Chief Complaint  Patient presents with   Back Pain    Here to go over my results.  I have been in pain on the right side of my back down to my feet since the last week.    Encounter Diagnosis  Name Primary?   Lumbar pain Yes    Assessment and Plan: 47 year old male with persistent chronic lower back pain which radiates from his right leg, status post CT scan (could not have MRI scan secondary to shunt)   I do not have a cause at this point for his back and leg pain but I think he is having definite symptoms and is not responding to the gabapentin cyclobenzaprine ibuprofen tramadol and physical therapy  At this point I think he should see a spine specialist.  If a CT myelogram is indicated then spine specialist can order   Prior treatment:  Physical therapy, gabapentin, cyclobenzaprine ibuprofen and tramadol  HPI: 47 year old male came in with vague back symptoms and leg symptoms back in 9 of 2023 eventually had a CT scan after physical therapy failed to improve his symptoms and his CT scan was not indicative of any significant pathology that we could see.  However, patient now having right leg radicular symptoms and left leg radicular symptoms      BP 122/87   Pulse 83   Ht 5\' 10"  (1.778 m)   Wt 274 lb (124.3 kg)   BMI 39.31 kg/m       A/P Encounter Diagnosis  Name Primary?   Lumbar pain Yes    No orders of the defined types were placed in this encounter.

## 2023-07-03 NOTE — Patient Instructions (Signed)
We are referring you to [facility] from [facility] Office address is [address] [place] The phone number is [phone]  The office will call you with an appointment Dr. Moore   

## 2023-07-09 ENCOUNTER — Ambulatory Visit: Payer: Medicare Other | Admitting: Allergy & Immunology

## 2023-08-05 NOTE — Patient Instructions (Incomplete)
1. Moderate persistent asthma, uncomplicated - Daily controller medication(s): Symbicort 160/4.25mcg two puffs twice daily with spacer - Prior to physical activity: AirSupra 2 puffs 10-15 minutes before physical activity. - Rescue medications: AirSupra 2 puffs every 4-6 hours as needed - Asthma control goals:  * Full participation in all desired activities (may need albuterol before activity) * Albuterol use two time or less a week on average (not counting use with activity) * Cough interfering with sleep two time or less a month * Oral steroids no more than once a year * No hospitalizations  2. Seasonal allergic rhinitis due to pollen (grasses, ragweed, weeds, and trees.) - Continue taking: Zyrtec (cetirizine) 10mg  tablet once daily (or Allegra) and Flonase (fluticasone) one spray per nostril daily - You can use an extra dose of the antihistamine, if needed, for breakthrough symptoms.  - Consider nasal saline rinses 1-2 times daily to remove allergens from the nasal cavities as well as help with mucous clearance (this is especially helpful to do before the nasal sprays are given) - Let's hold off on allergy shots.   3. Return in about  months

## 2023-08-06 ENCOUNTER — Other Ambulatory Visit: Payer: Self-pay

## 2023-08-06 ENCOUNTER — Ambulatory Visit (INDEPENDENT_AMBULATORY_CARE_PROVIDER_SITE_OTHER): Payer: Medicare Other | Admitting: Family

## 2023-08-06 ENCOUNTER — Encounter: Payer: Self-pay | Admitting: Family

## 2023-08-06 VITALS — BP 102/64 | HR 82 | Temp 98.6°F | Ht 70.0 in | Wt 272.8 lb

## 2023-08-06 DIAGNOSIS — H1013 Acute atopic conjunctivitis, bilateral: Secondary | ICD-10-CM

## 2023-08-06 DIAGNOSIS — J301 Allergic rhinitis due to pollen: Secondary | ICD-10-CM

## 2023-08-06 DIAGNOSIS — J4530 Mild persistent asthma, uncomplicated: Secondary | ICD-10-CM | POA: Diagnosis not present

## 2023-08-06 DIAGNOSIS — H1010 Acute atopic conjunctivitis, unspecified eye: Secondary | ICD-10-CM

## 2023-08-06 NOTE — Progress Notes (Signed)
[address] [place] [address] Dept: [phone]  FOLLOW UP NOTE  Patient ID: Randall Meadows, male    DOB: [birth year]  Age: 47 y.o. MRN: [id number] Date of Office Visit: 08/06/2023  Assessment  Chief Complaint: Follow-up (No concerns)  HPI Randall Meadows is a 47 year old male who presents today for follow up of moderate persistent asthma and seasonal allergic rhinitis due to pollen. He was last seen on 04/11/23 by Dr. Dellis Anes. He denies any new diagnosis or surgeries since his last office visit. He does mention that he fell last Monday while he was out with his wife.   Moderate persistent asthma is reported as being good. He will have some tightness in his chest/chest pain that is relieved by Airsupra or Symbicort.He has had negative cardiac workup. He reports that these symptoms will occur with weather changes or if he is upset/stressed. He denies cough, wheeze, shortness of breath and nocturnal awakenings due to breathing problems. Since his last appointment he has not required any systemic steroids or made any trips to the emergency room or urgent care. He has used AirSupra 2 times since his last office visit. He is currently using Symbicort 160/4.5 mcg 2 puffs once a day, but will sometimes do two puffs twice a day if he has symptoms.  Seasonal allergic rhinitis due to pollen: He reports nasal congestion in the morning with weather changes and denies rhinorrhea and post nasal drip. He has not been treated for any sinus infections since we last saw him. He takes Zyrtec 10  mg daily and uses fluticasone nasal spray as needed.   Drug Allergies:  Allergies  Allergen Reactions   Grass Pollen(K-O-R-T-Swt Vern) Itching    Review of Systems: Negative except as per HPI   Physical Exam: BP 102/64   Pulse 82   Temp 98.6 F (37 C)   Ht 5\' 10"  (1.778 m)   Wt 272 lb 12.8 oz (123.7 kg)   SpO2 96%   BMI 39.14 kg/m    Physical Exam Constitutional:       Appearance: Normal appearance.  HENT:     Head: Normocephalic and atraumatic.     Comments: Pharynx normal, eyes normal, ears normal, nose: Bilateral lower turbinates mildly edematous and slightly erythematous with no drainage noted    Right Ear: Tympanic membrane, ear canal and external ear normal.     Left Ear: Tympanic membrane, ear canal and external ear normal.     Mouth/Throat:     Mouth: Mucous membranes are moist.     Pharynx: Oropharynx is clear.  Eyes:     Conjunctiva/sclera: Conjunctivae normal.  Cardiovascular:     Rate and Rhythm: Regular rhythm.     Heart sounds: Normal heart sounds.  Pulmonary:     Effort: Pulmonary effort is normal.     Breath sounds: Normal breath sounds.     Comments: Lungs clear to auscultation Musculoskeletal:     Cervical back: Neck supple.  Skin:    General: Skin is warm.  Neurological:     Mental Status: He is alert and oriented to person, place, and time.  Psychiatric:        Mood and Affect: Mood normal.        Behavior: Behavior normal.        Thought Content: Thought content normal.        Judgment: Judgment normal.     Diagnostics: FVC 3.75 L (88%), FEV1 2.95 L (87%), FEV1/FVC  0.79.  Spirometry indicates normal spirometry.  Assessment and Plan: 1. Seasonal allergic rhinitis due to pollen   2. Mild persistent asthma, uncomplicated   3. Seasonal allergic conjunctivitis     No orders of the defined types were placed in this encounter.   Patient Instructions  1. Moderate persistent asthma, uncomplicated - Daily controller medication(s): Symbicort 160/4.30mcg two puffs once a day to twice day with spacer depending on symptoms - Prior to physical activity: AirSupra 2 puffs 10-15 minutes before physical activity. - Rescue medications: AirSupra 2 puffs every 4-6 hours as needed - Asthma control goals:  * Full participation in all desired activities (may need albuterol before activity) * Albuterol use two time or less a week on  average (not counting use with activity) * Cough interfering with sleep two time or less a month * Oral steroids no more than once a year * No hospitalizations  2. Seasonal allergic rhinitis due to pollen (grasses, ragweed, weeds, and trees.) - Continue taking: Zyrtec (cetirizine) 10mg  tablet once daily  and Flonase (fluticasone) one spray per nostril daily - You can use an extra dose of the antihistamine, if needed, for breakthrough symptoms.  - Consider nasal saline rinses 1-2 times daily to remove allergens from the nasal cavities as well as help with mucous clearance (this is especially helpful to do before the nasal sprays are given) - Let's hold off on allergy shots.   3. Return in about 6 months or sooner if needed      Return in about 6 months (around 02/04/2024).    Thank you for the opportunity to care for this patient.  Please do not hesitate to contact me with questions.  Nehemiah Settle, FNP [facility]

## 2023-08-12 NOTE — Addendum Note (Signed)
Addended by: Philipp Deputy on: 08/12/2023 11:21 AM   Modules accepted: Orders

## 2023-08-13 ENCOUNTER — Ambulatory Visit: Payer: Medicare Other | Admitting: Orthopedic Surgery

## 2023-08-18 ENCOUNTER — Telehealth: Payer: Self-pay | Admitting: Orthopedic Surgery

## 2023-08-18 NOTE — Telephone Encounter (Signed)
Randall Meadows, Randall Meadows, RT Thanks for trusting the care of your patient to our office. The provider would like to make you aware your patient was a NO SHOW for their appointment on 08/13/23 with Dr. Christell Constant.  Bonnita Nasuti Referral Coordinator Cyndia Skeeters [phone]  FYI only patient no showed appointment with Dr Christell Constant.

## 2023-08-26 ENCOUNTER — Other Ambulatory Visit: Payer: Self-pay | Admitting: Orthopedic Surgery

## 2023-08-26 DIAGNOSIS — M5410 Radiculopathy, site unspecified: Secondary | ICD-10-CM

## 2023-08-26 DIAGNOSIS — M545 Low back pain, unspecified: Secondary | ICD-10-CM

## 2023-10-02 ENCOUNTER — Ambulatory Visit: Payer: Medicare Other | Admitting: Orthopedic Surgery

## 2023-10-03 ENCOUNTER — Ambulatory Visit: Payer: Medicare Other | Admitting: Orthopedic Surgery

## 2023-11-03 ENCOUNTER — Ambulatory Visit: Payer: Medicare Other | Admitting: Orthopedic Surgery

## 2023-11-15 ENCOUNTER — Ambulatory Visit
Admission: EM | Admit: 2023-11-15 | Discharge: 2023-11-15 | Disposition: A | Discharge status: Home / Self Care | Payer: Medicare Other | Attending: Nurse Practitioner | Admitting: Nurse Practitioner

## 2023-11-15 DIAGNOSIS — Z8709 Personal history of other diseases of the respiratory system: Secondary | ICD-10-CM | POA: Diagnosis not present

## 2023-11-15 DIAGNOSIS — R059 Cough, unspecified: Secondary | ICD-10-CM | POA: Diagnosis not present

## 2023-11-15 MED ORDER — PREDNISONE 20 MG PO TABS: 40.0000 mg | ORAL_TABLET | Freq: Every day | ORAL | 0 refills | Status: AC

## 2023-11-15 MED ORDER — BENZONATATE 100 MG PO CAPS: 100.0000 mg | ORAL_CAPSULE | Freq: Three times a day (TID) | ORAL | 0 refills | Status: DC

## 2023-11-15 MED ORDER — AZITHROMYCIN 250 MG PO TABS: 250.0000 mg | ORAL_TABLET | Freq: Every day | ORAL | 0 refills | Status: DC

## 2023-11-15 NOTE — ED Provider Notes (Signed)
[facility]    CSN: [id number] Arrival date & time: 11/15/23  1132      History   Chief Complaint No chief complaint on file.   HPI Randall Meadows is a 48 y.o. male.   The history is provided by the patient.   Patient presents for complaints of cough that been present for the past 1-1/2 weeks.  He also complains of nasal congestion over the past 24 hours.  Denies fever, chills, headache, ear pain, wheezing, difficulty breathing, chest pain, abdominal pain, nausea, vomiting, diarrhea, or rash.  Patient reports he has been taking several over-the-counter cough and cold medications along with his regular allergy medications for his symptoms.  Patient with history of asthma.  States that he has been using his Symbicort and albuterol inhaler as needed.  Past Medical History:  Diagnosis Date   Asthma    Hypertension     Patient Active Problem List   Diagnosis Date Noted   Mild persistent asthma, uncomplicated 10/04/2022   Seasonal allergic rhinitis due to pollen 10/04/2022   Seasonal allergic conjunctivitis 10/04/2022   Anxiety state 04/03/2009   OBESITY 09/07/2007   Essential hypertension 09/07/2007   Allergic rhinitis 09/07/2007   Headache 09/07/2007   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/2007    Past Surgical History:  Procedure Laterality Date   TONSILLECTOMY         Home Medications    Prior to Admission medications   Medication Sig Start Date End Date Taking? Authorizing Provider  azithromycin (ZITHROMAX) 250 MG tablet Take 1 tablet (250 mg total) by mouth daily. Take first 2 tablets together, then 1 every day until finished. 11/15/23  Yes Leath-Warren, Sadie Haber, NP  benzonatate (TESSALON) 100 MG capsule Take 1 capsule (100 mg total) by mouth every 8 (eight) hours. 11/15/23  Yes Leath-Warren, Sadie Haber, NP  predniSONE (DELTASONE) 20 MG tablet Take 2 tablets (40 mg total) by mouth daily with breakfast for 5 days. 11/15/23 11/20/23  Yes Leath-Warren, Sadie Haber, NP  albuterol (VENTOLIN HFA) 108 (90 Base) MCG/ACT inhaler Inhale 1-2 puffs into the lungs every 4 (four) hours as needed for wheezing or shortness of breath (cough, shortness of breath or wheezing.). 06/21/22   Alfonse Spruce, MD  Albuterol-Budesonide [facility]) 90-80 MCG/ACT AERO Inhale 2 puffs into the lungs every 4 (four) hours as needed. 04/11/23   Alfonse Spruce, MD  budesonide-formoterol [facility]) 160-4.5 MCG/ACT inhaler Inhale 2 puffs into the lungs in the morning and at bedtime. 04/11/23   Alfonse Spruce, MD  budesonide-formoterol [facility]) 160-4.5 MCG/ACT inhaler Inhale 2 puffs into the lungs 2 (two) times daily. 04/11/23   Alfonse Spruce, MD  budesonide-formoterol [facility]) 160-4.5 MCG/ACT inhaler Inhale 2 puffs into the lungs 2 (two) times daily. 04/11/23   Alfonse Spruce, MD  cetirizine (ZYRTEC) 10 MG tablet Take 1 tablet (10 mg total) by mouth daily as needed for allergies. 10/04/22   Ambs, Norvel Richards, FNP  cyclobenzaprine (FLEXERIL) 10 MG tablet Take 10 mg by mouth 2 (two) times daily as needed for muscle spasms. 07/23/20   [provider]  fluticasone (FLONASE) 50 MCG/ACT nasal spray Place 1 spray into both nostrils daily. 04/11/23   Alfonse Spruce, MD  gabapentin (NEURONTIN) 100 MG capsule TAKE 1 CAPSULE BY MOUTH THREE TIMES DAILY 08/26/23   Vickki Hearing, MD  ibuprofen (ADVIL) 800 MG tablet Take 800 mg by mouth every 8 (eight) hours as needed.    [provider]  lisinopril-hydrochlorothiazide (ZESTORETIC) 20-25 MG tablet Take 1 tablet by mouth daily. 08/14/20   [provider]  LORazepam (ATIVAN) 0.5 MG tablet Take 0.5 mg by mouth at bedtime as needed for anxiety. 08/01/20   [provider]  meclizine (ANTIVERT) 25 MG tablet Take 12.5 mg by mouth 4 (four) times daily. 08/29/21   [provider]  metoprolol tartrate (LOPRESSOR) 25 MG tablet TAKE 1/2 TABLET BY MOUTH TWICE  DAILY 04/04/23   Jodelle Red, MD  rosuvastatin (CRESTOR) 10 MG tablet Take 10 mg by mouth daily. 06/09/22   [provider]  sertraline (ZOLOFT) 50 MG tablet Take 50 mg by mouth daily. 07/23/20   [provider]  traMADol (ULTRAM) 50 MG tablet Take 50 mg by mouth 4 (four) times daily. 07/24/22   [provider]    Family History Family History  Problem Relation Age of Onset   Urticaria Maternal Grandmother    Food Allergy Maternal Grandmother    Allergic rhinitis Son    Allergic rhinitis Son     Social History Social History   Tobacco Use   Smoking status: Never   Smokeless tobacco: Never  Vaping Use   Vaping status: Never Used  Substance Use Topics   Alcohol use: Never   Drug use: Never     Allergies   Grass pollen(k-o-r-t-swt vern)   Review of Systems Review of Systems Per HPI  Physical Exam Triage Vital Signs ED Triage Vitals  Encounter Vitals Group     BP 11/15/23 1147 132/83     Systolic BP Percentile --      Diastolic BP Percentile --      Pulse Rate 11/15/23 1147 99     Resp 11/15/23 1147 18     Temp 11/15/23 1147 98.5 F (36.9 C)     Temp Source 11/15/23 1147 Oral     SpO2 11/15/23 1147 96 %     Weight --      Height --      Head Circumference --      Peak Flow --      Pain Score 11/15/23 1149 0     Pain Loc --      Pain Education --      Exclude from Growth Chart --    No data found.  Updated Vital Signs BP 132/83 (BP Location: Right Arm)   Pulse 99   Temp 98.5 F (36.9 C) (Oral)   Resp 18   SpO2 96%   Visual Acuity Right Eye Distance:   Left Eye Distance:   Bilateral Distance:    Right Eye Near:   Left Eye Near:    Bilateral Near:     Physical Exam Vitals and nursing note reviewed.  Constitutional:      General: He is not in acute distress.    Appearance: Normal appearance.  HENT:     Head: Normocephalic.     Right Ear: Tympanic membrane, ear canal and external ear normal.     Left Ear:  Tympanic membrane, ear canal and external ear normal.     Nose: Congestion present.     Mouth/Throat:     Lips: Pink.     Mouth: Mucous membranes are moist.     Pharynx: Uvula midline. Postnasal drip present. No pharyngeal swelling, oropharyngeal exudate, posterior oropharyngeal erythema or uvula swelling.  Eyes:     Extraocular Movements: Extraocular movements intact.     Pupils: Pupils are equal, round, and reactive to light.  Cardiovascular:  Rate and Rhythm: Normal rate and regular rhythm.     Pulses: Normal pulses.     Heart sounds: Normal heart sounds.  Pulmonary:     Effort: Pulmonary effort is normal. No respiratory distress.     Breath sounds: Normal breath sounds. No stridor. No wheezing, rhonchi or rales.  Abdominal:     General: Bowel sounds are normal.     Palpations: Abdomen is soft.     Tenderness: There is no abdominal tenderness.  Musculoskeletal:     Cervical back: Normal range of motion.  Skin:    General: Skin is warm and dry.  Neurological:     General: No focal deficit present.     Mental Status: He is alert and oriented to person, place, and time.  Psychiatric:        Mood and Affect: Mood normal.        Behavior: Behavior normal.      UC Treatments / Results  Labs (all labs ordered are listed, but only abnormal results are displayed) Labs Reviewed - No data to display  EKG   Radiology No results found.  Procedures Procedures (including critical care time)  Medications Ordered in UC Medications - No data to display  Initial Impression / Assessment and Plan / UC Course  I have reviewed the triage vital signs and the nursing notes.  Pertinent labs & imaging results that were available during my care of the patient were reviewed by me and considered in my medical decision making (see chart for details).  On exam, lung sounds are clear throughout, room air sats at 96%.  Cough is been present for more than 10 days.  Will provide empiric  treatment for lower respiratory infection with azithromycin 250 mg, and for possible asthma flare, will start prednisone 40 mg for the next 5 days.  For the cough, Tessalon 100 mg was prescribed.  Patient advised to continue his current allergy medications.  Supportive care recommendations were provided and discussed with the patient to include fluids, rest, over-the-counter analgesics, and use of a humidifier at nighttime during sleep.  Discussed indications with the patient regarding follow-up.  Patient was in agreement with this plan of care and verbalizes understanding.  All questions were answered.  Patient stable for discharge.   Final Clinical Impressions(s) / UC Diagnoses   Final diagnoses:  Cough, unspecified type  History of asthma     Discharge Instructions      Take medication as prescribed. Increase fluids and allow for plenty of rest. May take over-the-counter Tylenol or ibuprofen as needed for pain, fever, or general discomfort. May use normal saline nasal spray for nasal congestion and runny nose. For the cough, recommend using a humidifier in your bedroom at nighttime during sleep and sleeping elevated on pillows while cough symptoms persist. As discussed, your cough may continue to linger.  If you are generally feeling well, but have a nagging cough, recommend fluids, cough drops, and over-the-counter cough medication as needed.  If you continue to have a cough, but experience fever, wheezing, difficulty breathing, or other concerns, you may follow-up in this clinic or with your primary care physician for further evaluation. Follow-up as needed.     ED Prescriptions     Medication Sig Dispense Auth. Provider   predniSONE (DELTASONE) 20 MG tablet Take 2 tablets (40 mg total) by mouth daily with breakfast for 5 days. 10 tablet Leath-Warren, Sadie Haber, NP   azithromycin (ZITHROMAX) 250 MG tablet Take 1 tablet (  250 mg total) by mouth daily. Take first 2 tablets together,  then 1 every day until finished. 6 tablet Leath-Warren, Sadie Haber, NP   benzonatate (TESSALON) 100 MG capsule Take 1 capsule (100 mg total) by mouth every 8 (eight) hours. 30 capsule Leath-Warren, Sadie Haber, NP      PDMP not reviewed this encounter.   Abran Cantor, NP 11/15/23 1300

## 2023-11-15 NOTE — ED Triage Notes (Signed)
Pt reports he has a cough x1 week and  5 days   Took Tylenol day and night cold meds and cough drops and coricidin

## 2023-11-15 NOTE — Discharge Instructions (Signed)
Take medication as prescribed. Increase fluids and allow for plenty of rest. May take over-the-counter Tylenol or ibuprofen as needed for pain, fever, or general discomfort. May use normal saline nasal spray for nasal congestion and runny nose. For the cough, recommend using a humidifier in your bedroom at nighttime during sleep and sleeping elevated on pillows while cough symptoms persist. As discussed, your cough may continue to linger.  If you are generally feeling well, but have a nagging cough, recommend fluids, cough drops, and over-the-counter cough medication as needed.  If you continue to have a cough, but experience fever, wheezing, difficulty breathing, or other concerns, you may follow-up in this clinic or with your primary care physician for further evaluation. Follow-up as needed.

## 2023-11-27 ENCOUNTER — Other Ambulatory Visit (INDEPENDENT_AMBULATORY_CARE_PROVIDER_SITE_OTHER): Payer: Self-pay

## 2023-11-27 ENCOUNTER — Ambulatory Visit: Payer: Medicare Other | Admitting: Orthopedic Surgery

## 2023-11-27 VITALS — BP 124/83 | HR 70 | Ht 70.0 in | Wt 273.0 lb

## 2023-11-27 DIAGNOSIS — M545 Low back pain, unspecified: Secondary | ICD-10-CM

## 2023-11-27 DIAGNOSIS — M5410 Radiculopathy, site unspecified: Secondary | ICD-10-CM | POA: Diagnosis not present

## 2023-11-27 NOTE — Progress Notes (Signed)
Orthopedic Spine Surgery Office Note  Assessment: Patient is a 48 y.o. male with low back pain and left posterior calf and heel pain   Plan: -Explained that initially conservative treatment is tried as a significant number of patients may experience relief with these treatment modalities. Discussed that the conservative treatments include:  -activity modification  -physical therapy  -over the counter pain medications  -medrol dosepak  -lumbar steroid injections -Patient has tried PT, gabapentin, ibuprofen, tramadol, cyclobenzaprine  -Patient has tried over 6 weeks of conservative treatment without any relief, so recommended a CT myelogram to evaluate further -Patient should return to office in 4 weeks, x-rays at next visit: None   Patient expressed understanding of the plan and all questions were answered to the patient's satisfaction.   ___________________________________________________________________________   History:  Patient is a 48 y.o. male who presents today for lumbar spine.  Patient has had approximately 1 year of low back pain and left leg pain.  He feels the left leg pain along the posterior calf and heel.  He does not have any other consistent leg pain.  There is no trauma or injury that proceeded the onset of pain.  He feels pain on a daily basis.  He said at its worst the pain can be a 10 out of 10.  It more often is a 6 out of 10. Feels pain has gotten progressively worse with time.    Weakness: Denies Symptoms of imbalance: Denies Paresthesias and numbness: Denies Bowel or bladder incontinence: Denies Saddle anesthesia: Denies  Treatments tried: PT, gabapentin, ibuprofen, tramadol, cyclobenzaprine  Review of systems: Denies fevers and chills, night sweats, unexplained weight loss, history of cancer.  Has had pain that wakes him at night  Past medical history: HLD HTN Anxiety Migraines  Allergies: NKDA  Past surgical history:  Tonsillectomy Shunt for  hydrocephalus  Social history: Denies use of nicotine product (smoking, vaping, patches, smokeless) Alcohol use: denies Denies recreational drug use   Physical Exam:  BMI of 39.2  General: no acute distress, appears stated age Neurologic: alert, answering questions appropriately, following commands Respiratory: unlabored breathing on room air, symmetric chest rise Psychiatric: appropriate affect, normal cadence to speech   MSK (spine):  -Strength exam      Left  Right EHL    5/5  5/5 TA    5/5  5/5 GSC    5/5  5/5 Knee extension  5/5  5/5 Hip flexion   5/5  5/5  -Sensory exam    Sensation intact to light touch in L3-S1 nerve distributions of bilateral lower extremities  -Achilles DTR: 2/4 on the left, 2/4 on the right -Patellar tendon DTR: 2/4 on the left, 2/4 on the right  -Straight leg raise: negative bilaterally -Femoral nerve stretch test: negative bilaterally -Clonus: no beats bilaterally  -Left hip exam: no pain through range of motion, negative faber, negative stinchfield -Right hip exam: no pain through range of motion, negative faber, negative stinchfield  Imaging: XRs of the lumbar spine from 11/27/2023 were independently reviewed and interpreted, showing no significant degenerative changes. No fracture or dislocation. No evidence of instability on flexion/extension views.   CT of the lumbar spine from 11/27/2023 was independently reviewed and interpreted, showing no significant degenerative changes. No fracture or dislocation seen. No spondylolisthesis seen.    Patient name: Randall Meadows Patient MRN: [id number] Date of visit: 11/27/23

## 2023-12-25 ENCOUNTER — Ambulatory Visit: Payer: Medicare Other | Admitting: Orthopedic Surgery

## 2023-12-25 DIAGNOSIS — M5416 Radiculopathy, lumbar region: Secondary | ICD-10-CM | POA: Diagnosis not present

## 2023-12-25 MED ORDER — DIAZEPAM 5 MG PO TABS: 5.0000 mg | ORAL_TABLET | Freq: Once | ORAL | 0 refills | Status: DC | PRN

## 2023-12-25 NOTE — Progress Notes (Signed)
 Orthopedic Spine Surgery Office Note   Assessment: Patient is a 48 y.o. male with low back pain that radiates into his left lateral thigh and posterior calf to the level of the ankle.  Has developed some intermittent pain radiating to his right lower extremity     Plan: -Patient has tried PT, gabapentin, ibuprofen, tramadol, cyclobenzaprine  -Prescribed Valium to be taken before his myelogram -Patient has tried over 6 weeks of conservative treatment without any relief, so recommended a CT myelogram to evaluate further -Patient should return to office in 4 weeks, x-rays at next visit: none     Patient expressed understanding of the plan and all questions were answered to the patient's satisfaction.    ___________________________________________________________________________     History:   Patient is a 48 y.o. male who presents today for follow-up on his lumbar spine.  Patient has had over a year of low back pain that radiates into his left leg.  He feels a going along the lateral aspect of his thigh into the posterior aspect of his leg to the level of the ankle.  More recently, his pain is gotten worse as he is developed right-sided symptoms as well.  He says the right-sided symptoms are periodic in nature.  He does not feel them every day.  He feels that pain going along the lateral thigh.  It does not radiate past the knee.  He feels the low back and left leg pain on a daily basis.  He has not noticed any changes in those symptoms.  No bowel or bladder incontinence.  No saddle anesthesia. No weakness noted.     Treatments tried: PT, gabapentin, ibuprofen, tramadol, cyclobenzaprine    Physical Exam:   General: no acute distress, appears stated age Neurologic: alert, answering questions appropriately, following commands Respiratory: unlabored breathing on room air, symmetric chest rise Psychiatric: appropriate affect, normal cadence to speech     MSK (spine):   -Strength exam                                                    Left                  Right EHL                              5/5                  5/5 TA                                 5/5                  5/5 GSC                             5/5                  5/5 Knee extension            5/5                  5/5 Hip flexion  5/5                  5/5   -Sensory exam                           Sensation intact to light touch in L3-S1 nerve distributions of bilateral lower extremities   -Achilles DTR: 2/4 on the left, 2/4 on the right -Patellar tendon DTR: 2/4 on the left, 2/4 on the right   -Straight leg raise: negative bilaterally -Clonus: no beats bilaterally   Imaging: XRs of the lumbar spine from 11/27/2023 were previously independently reviewed and interpreted, showing no significant degenerative changes. No fracture or dislocation. No evidence of instability on flexion/extension views.     Patient name: Randall Meadows Patient MRN: [id number] Date of visit: 12/25/23

## 2024-01-07 ENCOUNTER — Encounter: Payer: Self-pay | Admitting: Orthopedic Surgery

## 2024-01-09 ENCOUNTER — Other Ambulatory Visit

## 2024-01-19 ENCOUNTER — Ambulatory Visit: Admitting: Orthopedic Surgery

## 2024-01-22 ENCOUNTER — Ambulatory Visit: Payer: Medicare Other | Admitting: Orthopedic Surgery

## 2024-01-26 ENCOUNTER — Other Ambulatory Visit: Payer: Self-pay | Admitting: Orthopedic Surgery

## 2024-01-26 DIAGNOSIS — M545 Low back pain, unspecified: Secondary | ICD-10-CM

## 2024-01-26 DIAGNOSIS — M5410 Radiculopathy, site unspecified: Secondary | ICD-10-CM

## 2024-02-04 ENCOUNTER — Ambulatory Visit (INDEPENDENT_AMBULATORY_CARE_PROVIDER_SITE_OTHER): Payer: Medicare Other | Admitting: Family Medicine

## 2024-02-04 ENCOUNTER — Encounter: Payer: Self-pay | Admitting: Family Medicine

## 2024-02-04 ENCOUNTER — Other Ambulatory Visit: Payer: Self-pay

## 2024-02-04 VITALS — BP 120/88 | HR 84 | Temp 98.2°F | Resp 12

## 2024-02-04 DIAGNOSIS — J4530 Mild persistent asthma, uncomplicated: Secondary | ICD-10-CM | POA: Diagnosis not present

## 2024-02-04 DIAGNOSIS — H1013 Acute atopic conjunctivitis, bilateral: Secondary | ICD-10-CM | POA: Diagnosis not present

## 2024-02-04 DIAGNOSIS — J301 Allergic rhinitis due to pollen: Secondary | ICD-10-CM

## 2024-02-04 DIAGNOSIS — H1010 Acute atopic conjunctivitis, unspecified eye: Secondary | ICD-10-CM

## 2024-02-04 MED ORDER — BUDESONIDE-FORMOTEROL FUMARATE 160-4.5 MCG/ACT IN AERO: 2.0000 | INHALATION_SPRAY | Freq: Every day | RESPIRATORY_TRACT | 5 refills | Status: DC

## 2024-02-04 MED ORDER — CETIRIZINE HCL 10 MG PO TABS: 10.0000 mg | ORAL_TABLET | Freq: Every day | ORAL | 5 refills | Status: DC | PRN

## 2024-02-04 MED ORDER — FLUTICASONE PROPIONATE 50 MCG/ACT NA SUSP: 2.0000 | Freq: Every day | NASAL | 5 refills | Status: DC | PRN

## 2024-02-04 MED ORDER — ALBUTEROL SULFATE HFA 108 (90 BASE) MCG/ACT IN AERS
1.0000 | INHALATION_SPRAY | RESPIRATORY_TRACT | 1 refills | Status: AC | PRN
Start: 2024-02-04 — End: ?

## 2024-02-04 NOTE — Progress Notes (Deleted)
 [name] Kitchen

## 2024-02-04 NOTE — Patient Instructions (Addendum)
 Asthma Continue Symbicort 160-2 puffs once a day with a spacer to prevent cough or wheeze Continue albuterol 2 puffs once every 4 hours as needed for cough or wheeze You may use albuterol 2 puffs 5 to 15 minutes before activity to decrease cough or wheeze For asthma flare, increase Symbicort 160 to 2 puffs twice a day for 2 weeks or until cough and wheeze free, then return to original dosing  Allergic rhinitis Continue allergen avoidance measures directed toward grass pollen, weed pollen, ragweed pollen, and tree pollen as listed below Continue cetirizine 10 mg once a day as needed for runny nose or itch Continue Flonase 2 sprays in each nostril once a day as needed for a stuffy nose Consider saline nasal rinses as needed for nasal symptoms. Use this before any medicated nasal sprays for best result  Allergic conjunctivitis Some over the counter eye drops include Pataday one drop in each eye once a day as needed for red, itchy eyes OR Zaditor one drop in each eye twice a day as needed for red itchy eyes. Call the clinic if this treatment plan is not working well for you  Follow up in 6 months or sooner if needed.  Reducing Pollen Exposure The American Academy of Allergy, Asthma and Immunology suggests the following steps to reduce your exposure to pollen during allergy seasons. Do not hang sheets or clothing out to dry; pollen may collect on these items. Do not mow lawns or spend time around freshly cut grass; mowing stirs up pollen. Keep windows closed at night.  Keep car windows closed while driving. Minimize morning activities outdoors, a time when pollen counts are usually at their highest. Stay indoors as much as possible when pollen counts or humidity is high and on windy days when pollen tends to remain in the air longer. Use air conditioning when possible.  Many air conditioners have filters that trap the pollen spores. Use a HEPA room air filter to remove pollen form the indoor air  you breathe.

## 2024-02-04 NOTE — Progress Notes (Signed)
 [address] [place] [address] Dept: [phone]  FOLLOW UP NOTE  Patient ID: Randall Meadows, male    DOB: [birth year]  Age: 48 y.o. MRN: [id number] Date of Office Visit: 02/04/2024  Assessment  Chief Complaint: Follow-up (Asthma/Allergies/No concerns)  HPI Randall Meadows is a 48 year old male who presents to the clinic for a follow up visit. He was last seen in this clinic on 08/05/2024 by Nehemiah Settle, FNP, for evaluation of asthma and allergic rhinitis.  At today's visit, he reports his asthma has been moderately well controlled with occasional symptoms that occurred between February and March. He reports no symptoms at this time including shortness of breath, cough or wheeze. He continues Symbicort 160-2 puffs once a day with a spacer and has not needed to use his AirSupra recently.   Allergic rhinitis is reported as well controlled at this time. He reports that he experiences symptoms of allergic rhinitis between the months of February and March which have resolved at this time. He continues cetirizine 10 mg once a day and uses Flonase as needed for relief of symptoms. He is not currently using a nasal saline rinse. His last environmental allergy skin testing was on 10/10/2021 and was positive to grass pollen, weed pollen, tree pollen, and ragweed pollen.   Allergic conjunctivitis is reported as moderately well controlled with only occasional symptoms including red and itchy eyes. He is not currently using an allergy eye drop or a lubricating eye drop.   His current medications are listed in the chart.   Drug Allergies:  Allergies  Allergen Reactions   Grass Pollen(K-O-R-T-Swt Vern) Itching    Physical Exam: BP 120/88   Pulse 84   Temp 98.2 F (36.8 C)   Resp 12   SpO2 94%    Physical Exam Vitals reviewed.  Constitutional:      Appearance: Normal appearance.  HENT:     Head: Normocephalic and atraumatic.     Right Ear: Tympanic membrane  normal.     Left Ear: Tympanic membrane normal.     Nose:     Comments: Bilateral nares slightly erythematous with clear nasal drainage. Pharynx normal. Ears normal. Eyes normal.    Mouth/Throat:     Pharynx: Oropharynx is clear.  Eyes:     Conjunctiva/sclera: Conjunctivae normal.  Cardiovascular:     Rate and Rhythm: Normal rate and regular rhythm.     Heart sounds: Normal heart sounds. No murmur heard. Pulmonary:     Effort: Pulmonary effort is normal.     Breath sounds: Normal breath sounds.     Comments: Lungs clear to auscultation Musculoskeletal:        General: Normal range of motion.     Cervical back: Normal range of motion and neck supple.  Skin:    General: Skin is warm and dry.  Neurological:     Mental Status: He is alert and oriented to person, place, and time.  Psychiatric:        Mood and Affect: Mood normal.        Behavior: Behavior normal.        Thought Content: Thought content normal.        Judgment: Judgment normal.     Diagnostics: FVC 3.51 which is 83% of predicted value, FEV1 2.76 which is 82% of predicted value.   Assessment and Plan: 1. Mild persistent asthma, uncomplicated   2. Seasonal allergic rhinitis due to pollen   3. Seasonal allergic  conjunctivitis     Meds ordered this encounter  Medications   budesonide-formoterol (SYMBICORT) 160-4.5 MCG/ACT inhaler    Sig: Inhale 2 puffs into the lungs daily.    Dispense:  10.2 g    Refill:  5   cetirizine (ZYRTEC) 10 MG tablet    Sig: Take 1 tablet (10 mg total) by mouth daily as needed for allergies.    Dispense:  30 tablet    Refill:  5   albuterol (VENTOLIN HFA) 108 (90 Base) MCG/ACT inhaler    Sig: Inhale 1-2 puffs into the lungs every 4 (four) hours as needed for wheezing or shortness of breath (cough, shortness of breath or wheezing.).    Dispense:  18 g    Refill:  1   fluticasone (FLONASE) 50 MCG/ACT nasal spray    Sig: Place 2 sprays into both nostrils daily as needed.     Dispense:  16 g    Refill:  5    Patient Instructions  Asthma Continue Symbicort 160-2 puffs once a day with a spacer to prevent cough or wheeze Continue albuterol 2 puffs once every 4 hours as needed for cough or wheeze You may use albuterol 2 puffs 5 to 15 minutes before activity to decrease cough or wheeze For asthma flare, increase Symbicort 160 to 2 puffs twice a day for 2 weeks or until cough and wheeze free, then return to original dosing  Allergic rhinitis Continue allergen avoidance measures directed toward grass pollen, weed pollen, ragweed pollen, and tree pollen as listed below Continue cetirizine 10 mg once a day as needed for runny nose or itch Continue Flonase 2 sprays in each nostril once a day as needed for a stuffy nose Consider saline nasal rinses as needed for nasal symptoms. Use this before any medicated nasal sprays for best result  Allergic conjunctivitis Some over the counter eye drops include Pataday one drop in each eye once a day as needed for red, itchy eyes OR Zaditor one drop in each eye twice a day as needed for red itchy eyes. Call the clinic if this treatment plan is not working well for you  Follow up in 6 months or sooner if needed.    Return in about 6 months (around 08/05/2024), or if symptoms worsen or fail to improve.    Thank you for the opportunity to care for this patient.  Please do not hesitate to contact me with questions.  Thermon Leyland, FNP [facility]

## 2024-03-15 NOTE — Discharge Instructions (Signed)

## 2024-03-16 ENCOUNTER — Other Ambulatory Visit

## 2024-03-16 ENCOUNTER — Inpatient Hospital Stay
Admission: RE | Admit: 2024-03-16 | Discharge: 2024-03-16 | Disposition: A | Discharge status: Home / Self Care | Source: Ambulatory Visit | Attending: Orthopedic Surgery | Admitting: Orthopedic Surgery

## 2024-03-18 LAB — LAB REPORT - SCANNED
A1c: 6.1
EGFR: 82
PSA, Total: 1.14

## 2024-04-02 ENCOUNTER — Telehealth: Payer: Self-pay | Admitting: Allergy & Immunology

## 2024-04-02 DIAGNOSIS — M2550 Pain in unspecified joint: Secondary | ICD-10-CM

## 2024-04-02 NOTE — Telephone Encounter (Signed)
 I called the patient and informed of message. I informed to get labs done and to allow 1-2 weeks for results to come back.

## 2024-04-02 NOTE — Telephone Encounter (Signed)
 He will need repeat labs since these are older. I have seen Rheumatology refuse a referral due to out of date labs.

## 2024-04-02 NOTE — Telephone Encounter (Signed)
 Patient came in today stating he is needing another referral to another rheumatologist. Patient states when he got the referral over a year ago the previous rheumatology was scheduling too far out.

## 2024-04-15 ENCOUNTER — Ambulatory Visit: Admitting: Orthopedic Surgery

## 2024-04-16 ENCOUNTER — Encounter: Payer: Self-pay | Admitting: Orthopedic Surgery

## 2024-04-22 LAB — ANTINUCLEAR ANTIBODIES, IFA: ANA Titer 1: POSITIVE — AB

## 2024-04-22 LAB — FANA STAINING PATTERNS: Speckled Pattern: 1:320 {titer} — ABNORMAL HIGH

## 2024-04-22 LAB — C-REACTIVE PROTEIN: CRP: 5 mg/L (ref 0–10)

## 2024-04-22 LAB — SEDIMENTATION RATE: Sed Rate: 2 mm/h (ref 0–30)

## 2024-04-23 ENCOUNTER — Ambulatory Visit: Admitting: Orthopedic Surgery

## 2024-04-26 ENCOUNTER — Ambulatory Visit: Payer: Self-pay | Admitting: Allergy & Immunology

## 2024-04-26 DIAGNOSIS — R768 Other specified abnormal immunological findings in serum: Secondary | ICD-10-CM

## 2024-04-26 DIAGNOSIS — M2550 Pain in unspecified joint: Secondary | ICD-10-CM

## 2024-06-02 ENCOUNTER — Other Ambulatory Visit: Payer: Self-pay | Admitting: Orthopedic Surgery

## 2024-06-02 DIAGNOSIS — M5410 Radiculopathy, site unspecified: Secondary | ICD-10-CM

## 2024-06-02 DIAGNOSIS — M545 Low back pain, unspecified: Secondary | ICD-10-CM

## 2024-06-02 NOTE — Telephone Encounter (Signed)
 Discussed with patient. He wants to see Redell Lorene Ness, a PA with Rheumatology at [facility].   Marty Shaggy, MD [facility]  

## 2024-06-16 ENCOUNTER — Telehealth: Payer: Self-pay | Admitting: Allergy & Immunology

## 2024-06-16 NOTE — Telephone Encounter (Signed)
 Patient came in today stating he needs a refill on Zyrtec  sent to [facility] in [place].

## 2024-06-18 ENCOUNTER — Telehealth: Payer: Self-pay | Admitting: Allergy & Immunology

## 2024-06-18 MED ORDER — CETIRIZINE HCL 10 MG PO TABS: 10.0000 mg | ORAL_TABLET | Freq: Every day | ORAL | 5 refills | Status: AC | PRN

## 2024-06-18 NOTE — Telephone Encounter (Signed)
 Bralen HAS BEEN REFERRED TO [facility] TO SEE BRIAN BELFI, PA.  THE REFERRAL AND ALL CORRESPONDING NOTES HAVE BEEN FAXED.  THEY WILL REACH OUT TO THE PATIENT TO SCHEDULE.  I WILL FOLLOW UP IN A WEEK.

## 2024-06-23 ENCOUNTER — Ambulatory Visit (INDEPENDENT_AMBULATORY_CARE_PROVIDER_SITE_OTHER): Admitting: Allergy & Immunology

## 2024-06-23 ENCOUNTER — Other Ambulatory Visit: Payer: Self-pay

## 2024-06-23 ENCOUNTER — Encounter: Payer: Self-pay | Admitting: Allergy & Immunology

## 2024-06-23 VITALS — BP 132/74 | HR 67 | Temp 97.4°F | Resp 18 | Ht 68.9 in | Wt 254.8 lb

## 2024-06-23 DIAGNOSIS — T7840XA Allergy, unspecified, initial encounter: Secondary | ICD-10-CM

## 2024-06-23 DIAGNOSIS — J301 Allergic rhinitis due to pollen: Secondary | ICD-10-CM | POA: Diagnosis not present

## 2024-06-23 DIAGNOSIS — J4530 Mild persistent asthma, uncomplicated: Secondary | ICD-10-CM

## 2024-06-23 DIAGNOSIS — L299 Pruritus, unspecified: Secondary | ICD-10-CM | POA: Diagnosis not present

## 2024-06-23 MED ORDER — TRIAMCINOLONE ACETONIDE 0.1 % EX CREA: TOPICAL_CREAM | CUTANEOUS | 0 refills | Status: AC

## 2024-06-23 MED ORDER — PREDNISONE 10 MG PO TABS: ORAL_TABLET | ORAL | 0 refills | Status: DC

## 2024-06-23 MED ORDER — HYDROXYZINE HCL 25 MG PO TABS: 25.0000 mg | ORAL_TABLET | Freq: Every day | ORAL | 1 refills | Status: DC

## 2024-06-23 MED ORDER — METHYLPREDNISOLONE ACETATE 40 MG/ML INJ SUSP (RADIOLOG
40.0000 mg | Freq: Once | INTRAMUSCULAR | Status: AC
  Administered 2024-06-23: 40 mg via INTRAMUSCULAR

## 2024-06-23 NOTE — Patient Instructions (Signed)
 1. Itching and hives - DepoMedrol 40mg  given in clinic today. - Start the prednisone  burst provided and complete through the end. - Start triamcinolone  0.1% cream twice daily applied in a thin layer to affected lesions. - We just need to calm everything to get things under better control.  - Increase Zyrtec  to 2 tablets twice daily and continue through the next appointment. - Add on hydroxyzine  25mg  at night to help with sleeping.   2. Moderate persistent asthma, uncomplicated - Lung testing not done today.  - Daily controller medication(s): Symbicort  160/4.79mcg two puffs twice daily with spacer - Prior to physical activity: AirSupra  2 puffs 10-15 minutes before physical activity. - Rescue medications: AirSupra  2 puffs every 4-6 hours as needed - Asthma control goals:  * Full participation in all desired activities (may need albuterol  before activity) * Albuterol  use two time or less a week on average (not counting use with activity) * Cough interfering with sleep two time or less a month * Oral steroids no more than once a year * No hospitalizations  2. Seasonal allergic rhinitis due to pollen (grasses, ragweed, weeds, and trees.) - Continue taking: Zyrtec  (cetirizine ) 10mg  tablet once daily (or Allegra) and Flonase  (fluticasone ) one spray per nostril daily - You can use an extra dose of the antihistamine, if needed, for breakthrough symptoms.  - Consider nasal saline rinses 1-2 times daily to remove allergens from the nasal cavities as well as help with mucous clearance (this is especially helpful to do before the nasal sprays are given) - Let's hold off on allergy shots.   3. Return in about 4 weeks (around 07/21/2024). You can have the follow up appointment with Dr. Iva or a Nurse Practicioner (our Nurse Practitioners are excellent and always have Physician oversight!).    Please inform us  of any Emergency Department visits, hospitalizations, or changes in symptoms. Call us   before going to the ED for breathing or allergy symptoms since we might be able to fit you in for a sick visit. Feel free to contact us  anytime with any questions, problems, or concerns.  It was a pleasure to see you and your family again today!  Websites that have reliable patient information: 1. American Academy of Asthma, Allergy, and Immunology: www.aaaai.org 2. Food Allergy Research and Education (FARE): foodallergy.org 3. Mothers of Asthmatics: http://www.asthmacommunitynetwork.org 4. American College of Allergy, Asthma, and Immunology: www.acaai.org      "Like" us  on Facebook and Instagram for our latest updates!      A healthy democracy works best when [employer] participate! Make sure you are registered to vote! If you have moved or changed any of your contact information, you will need to get this updated before voting! Scan the QR codes below to learn more!

## 2024-06-23 NOTE — Progress Notes (Signed)
 FOLLOW UP  Date of Service/Encounter:  06/23/24   Assessment:   Mild intermittent asthma, uncomplicated - increasing to the higher dose Symbicort  today to see if this controls his SOB a bit more effectively   Seasonal allergic rhinitis due to pollen (grasses, ragweed, weeds, trees)    Negative cardiac work-up - with continued intermittent chest pain that is responsive to albuterol    Costochondritis and joint pain (especially left ankle) - with mildly elevated ANA (1:80)    Multiple social stressors   New onset itching - steroids started (topical and systemic)   Plan/Recommendations:   1. Itching and hives - DepoMedrol 40mg  given in clinic today. - Start the prednisone  burst provided and complete through the end. - Start triamcinolone  0.1% cream twice daily applied in a thin layer to affected lesions. - We just need to calm everything to get things under better control.  - Increase Zyrtec  to 2 tablets twice daily and continue through the next appointment. - Add on hydroxyzine  25mg  at night to help with sleeping.   2. Moderate persistent asthma, uncomplicated - Lung testing not done today.  - Daily controller medication(s): Symbicort  160/4.79mcg two puffs twice daily with spacer - Prior to physical activity: AirSupra  2 puffs 10-15 minutes before physical activity. - Rescue medications: AirSupra  2 puffs every 4-6 hours as needed - Asthma control goals:  * Full participation in all desired activities (may need albuterol  before activity) * Albuterol  use two time or less a week on average (not counting use with activity) * Cough interfering with sleep two time or less a month * Oral steroids no more than once a year * No hospitalizations  2. Seasonal allergic rhinitis due to pollen (grasses, ragweed, weeds, and trees.) - Continue taking: Zyrtec  (cetirizine ) 10mg  tablet once daily (or Allegra) and Flonase  (fluticasone ) one spray per nostril daily - You can use an extra dose of  the antihistamine, if needed, for breakthrough symptoms.  - Consider nasal saline rinses 1-2 times daily to remove allergens from the nasal cavities as well as help with mucous clearance (this is especially helpful to do before the nasal sprays are given) - Let's hold off on allergy shots.   3. Return in about 4 weeks (around 07/21/2024). You can have the follow up appointment with Dr. Iva or a Nurse Practicioner (our Nurse Practitioners are excellent and always have Physician oversight!).    Subjective:   Randall Meadows is a 48 y.o. male presenting today for follow up of  Chief Complaint  Patient presents with   itching    Has been itching for pass 2 week. Has mold in house not sure how long It been there.     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  Discussed the use of AI scribe software for clinical note transcription with the patient and/or guardian, who gave verbal consent to proceed.  Randall Meadows is a 48 y.o. male presenting for a follow up visit.  He was last seen in April 2025.  At that time, we continue with Symbicort  160 mcg  2 puffs once a day, increasing to 2 puffs twice daily during flares.  For his allergic rhinitis, he was continued on cetirizine  as well as Flonase .  He also continue with Zaditor.  Since last visit, he has had some new onset itching.   He has been experiencing generalized itching and hives for the past two and a half weeks, which began after he walked around the perimeter of his  grandmother's house and was exposed to grass. He has a known allergy to grass, confirmed by allergy testing two years ago. The itching began after walking around the perimeter of his grandmother's property and has persisted since then.  The hives are described as small, fine red bumps primarily located on his legs and arms. Scratching the affected areas has exacerbated the redness. He has not used any ointments or treatments except for Benadryl, which he takes between one and three tablets, but it is ineffective. He has not used any steroid ointments recently. He mentions difficulty sleeping due to the itching and his dog's restlessness at night.  There are no new exposures.  He denies any involvement of his throat.  He has increased shortness of breath, but otherwise no systemic symptoms.    Asthma/Respiratory Symptom History: He also experiences shortness of breath, which he associates with mold exposure in his apartment. He has been using his albuterol  inhaler to manage these symptoms. His asthma has been manageable, although it has been affected by the lack of air conditioning in his apartment for the past two months.  Allergic Rhinitis Symptom History: He remains on the Zyrtec  10 mg daily as well as Flonase  as needed.  Allergy shots, but he not really interested in doing that.  Otherwise, there have been no changes to his past medical history, surgical history, family history, or social history.    Review of systems otherwise negative other than that mentioned in the HPI.    Objective:   Blood pressure 132/74, pulse 67, temperature (!) 97.4 F (36.3 C), temperature source Temporal, resp. rate 18, height 5' 8.9 (1.75 m), weight 254 lb 12.8 oz (115.6 kg), SpO2 94%. Body mass index is 37.74 kg/m.    Physical Exam Vitals reviewed.  Constitutional:      Appearance: He is well-developed.     Comments: Very talkative as always.   HENT:     Head: Normocephalic and atraumatic.     Right  Ear: Tympanic membrane, ear canal and external ear normal. No drainage, swelling or tenderness. Tympanic membrane is not injected, scarred, erythematous, retracted or bulging.     Left Ear: Tympanic membrane, ear canal and external ear normal. No drainage, swelling or tenderness. Tympanic membrane is not injected, scarred, erythematous, retracted or bulging.     Nose: No nasal deformity, septal deviation, mucosal edema or rhinorrhea.     Right Turbinates: Enlarged, swollen and pale.     Left Turbinates: Enlarged, swollen and pale.     Right Sinus: No maxillary sinus tenderness or frontal sinus tenderness.     Left Sinus: No maxillary sinus tenderness or frontal sinus tenderness.     Mouth/Throat:     Mouth: Mucous membranes are not pale and not dry.     Pharynx: Uvula midline.  Eyes:     General: Allergic shiner present.        Right eye: No discharge.        Left eye: No discharge.     Conjunctiva/sclera: Conjunctivae normal.     Right eye: Right  conjunctiva is not injected. No chemosis.    Left eye: Left conjunctiva is not injected. No chemosis.    Pupils: Pupils are equal, round, and reactive to light.  Cardiovascular:     Rate and Rhythm: Normal rate and regular rhythm.     Heart sounds: Normal heart sounds.  Pulmonary:     Effort: Pulmonary effort is normal. No tachypnea, accessory muscle usage or respiratory distress.     Breath sounds: Normal breath sounds. No wheezing, rhonchi or rales.     Comments: Moving air well in all lung fields.  No increased work of breathing. Chest:     Chest wall: No tenderness.  Abdominal:     Tenderness: There is no abdominal tenderness. There is no guarding or rebound.  Musculoskeletal:     Comments: Tenderness over the dorsum of the left foot.   Lymphadenopathy:     Head:     Right side of head: No submandibular, tonsillar or occipital adenopathy.     Left side of head: No submandibular, tonsillar or occipital adenopathy.     Cervical: No  cervical adenopathy.  Skin:    General: Skin is warm.     Capillary Refill: Capillary refill takes less than 2 seconds.     Coloration: Skin is not pale.     Findings: No abrasion, erythema, petechiae or rash. Rash is not papular, urticarial or vesicular.     Comments: He has several hyperpigmented raised lesions on the bilateral arms.  He also has similar-appearing lesions on the bilateral legs.  They almost appear like prurigo nodularis.  Neurological:     Mental Status: He is alert.  Psychiatric:        Behavior: Behavior is cooperative.      Diagnostic studies: none       Randall Shaggy, MD  [facility] 

## 2024-06-24 ENCOUNTER — Ambulatory Visit (INDEPENDENT_AMBULATORY_CARE_PROVIDER_SITE_OTHER): Admitting: Orthopedic Surgery

## 2024-06-24 DIAGNOSIS — M5410 Radiculopathy, site unspecified: Secondary | ICD-10-CM

## 2024-06-24 DIAGNOSIS — M545 Low back pain, unspecified: Secondary | ICD-10-CM | POA: Diagnosis not present

## 2024-06-24 NOTE — Progress Notes (Signed)
 Chief complaint back pain  The patient has been scheduled with Dr. Georgina through evaluate his lower back problems however he was scheduled for CT myelogram and just could not do it as he was afraid of the potential complications  He continues with lower back pain despite normal CT.  He has had adequate treatment  The pain radiates down his left leg to his knee and occasionally into his left foot  He has a shunt and cannot have an MRI  Dr. Georgina recommended a CT myelogram as stated and he could not do it   His primary care physician started him on hydrocodone and then increased it to oxycodone  and he gets partial relief with oxycodone   That would not be my choice of medication for back pain without MRI or CT myelographic evidence of a diagnosis that correlates with such severe pain.  Actually that would be contraindicated  Nonetheless he needs to see a back specialist I cannot help him with this problem  We recommended Dr. Katrina

## 2024-07-13 NOTE — Telephone Encounter (Signed)
 I FAXED THE REFERRAL AGAIN.  IT LOOKS LIKE THEY STILL HAVEN'T REACHED OUT TO Randall Meadows.  I WILL FOLLOW BACK UP.

## 2024-07-14 ENCOUNTER — Other Ambulatory Visit: Payer: Self-pay | Admitting: Orthopedic Surgery

## 2024-07-14 DIAGNOSIS — M545 Low back pain, unspecified: Secondary | ICD-10-CM

## 2024-07-14 DIAGNOSIS — M5410 Radiculopathy, site unspecified: Secondary | ICD-10-CM

## 2024-07-17 ENCOUNTER — Other Ambulatory Visit: Payer: Self-pay | Admitting: Allergy & Immunology

## 2024-07-21 ENCOUNTER — Ambulatory Visit (INDEPENDENT_AMBULATORY_CARE_PROVIDER_SITE_OTHER): Admitting: Allergy & Immunology

## 2024-07-21 ENCOUNTER — Encounter: Payer: Self-pay | Admitting: Allergy & Immunology

## 2024-07-21 ENCOUNTER — Other Ambulatory Visit: Payer: Self-pay

## 2024-07-21 VITALS — BP 130/80 | HR 66 | Temp 97.8°F | Resp 18 | Ht 70.47 in | Wt 252.0 lb

## 2024-07-21 DIAGNOSIS — J301 Allergic rhinitis due to pollen: Secondary | ICD-10-CM | POA: Diagnosis not present

## 2024-07-21 DIAGNOSIS — M2550 Pain in unspecified joint: Secondary | ICD-10-CM | POA: Diagnosis not present

## 2024-07-21 DIAGNOSIS — R768 Other specified abnormal immunological findings in serum: Secondary | ICD-10-CM | POA: Diagnosis not present

## 2024-07-21 DIAGNOSIS — J4530 Mild persistent asthma, uncomplicated: Secondary | ICD-10-CM | POA: Diagnosis not present

## 2024-07-21 MED ORDER — BENZONATATE 100 MG PO CAPS: 100.0000 mg | ORAL_CAPSULE | Freq: Three times a day (TID) | ORAL | 0 refills | Status: AC

## 2024-07-21 MED ORDER — FLUTICASONE PROPIONATE 50 MCG/ACT NA SUSP: 2.0000 | Freq: Every day | NASAL | 5 refills | Status: AC | PRN

## 2024-07-21 MED ORDER — BUDESONIDE-FORMOTEROL FUMARATE 160-4.5 MCG/ACT IN AERO: 2.0000 | INHALATION_SPRAY | Freq: Every day | RESPIRATORY_TRACT | 5 refills | Status: AC

## 2024-07-21 MED ORDER — HYDROXYZINE HCL 25 MG PO TABS: 25.0000 mg | ORAL_TABLET | Freq: Every day | ORAL | 1 refills | Status: DC

## 2024-07-21 NOTE — Patient Instructions (Addendum)
 1. Itching and hives - I am glad that things are under better control. - I would prefer to avoid the prednisone  given the bad side effects.  - Continue with triamcinolone  0.1% cream twice daily applied in a thin layer to affected lesions. - Continue with Zyrtec  1-2  tablets twice daily. - Continue with hydroxyzine  25mg  at night to help with sleeping.  - We cna consider adding on Dupixent to help with itching and hive control.   2. Moderate persistent asthma, uncomplicated - Lung testing looks good today.  - Daily controller medication(s): Symbicort  160/4.46mcg two puffs twice daily with spacer - Prior to physical activity: AirSupra  2 puffs 10-15 minutes before physical activity. - Rescue medications: AirSupra  2 puffs every 4-6 hours as needed - Asthma control goals:  * Full participation in all desired activities (may need albuterol  before activity) * Albuterol  use two time or less a week on average (not counting use with activity) * Cough interfering with sleep two time or less a month * Oral steroids no more than once a year * No hospitalizations  2. Seasonal allergic rhinitis due to pollen (grasses, ragweed, weeds, and trees.) - Continue taking: Zyrtec  (cetirizine ) 10mg  tablet once daily (or Allegra) and Flonase  (fluticasone ) one spray per nostril daily - You can use an extra dose of the antihistamine, if needed, for breakthrough symptoms.  - Consider nasal saline rinses 1-2 times daily to remove allergens from the nasal cavities as well as help with mucous clearance (this is especially helpful to do before the nasal sprays are given)  3. Return in about 6 months (around 01/18/2025). You can have the follow up appointment with Dr. Iva or a Nurse Practicioner (our Nurse Practitioners are excellent and always have Physician oversight!).    Please inform us  of any Emergency Department visits, hospitalizations, or changes in symptoms. Call us  before going to the ED for breathing or  allergy symptoms since we might be able to fit you in for a sick visit. Feel free to contact us  anytime with any questions, problems, or concerns.  It was a pleasure to see you and your family again today!  Websites that have reliable patient information: 1. American Academy of Asthma, Allergy, and Immunology: www.aaaai.org 2. Food Allergy Research and Education (FARE): foodallergy.org 3. Mothers of Asthmatics: http://www.asthmacommunitynetwork.org 4. American College of Allergy, Asthma, and Immunology: www.acaai.org      "Like" us  on Facebook and Instagram for our latest updates!      A healthy democracy works best when [employer] participate! Make sure you are registered to vote! If you have moved or changed any of your contact information, you will need to get this updated before voting! Scan the QR codes below to learn more!

## 2024-07-21 NOTE — Progress Notes (Signed)
 FOLLOW UP  Date of Service/Encounter:  07/21/24   Assessment:   Mild intermittent asthma, uncomplicated - increasing to the higher dose Symbicort  today to see if this controls his SOB a bit more effectively   Seasonal allergic rhinitis due to pollen (grasses, ragweed, weeds, trees)    Negative cardiac work-up - with continued intermittent chest pain that is responsive to albuterol    Costochondritis and joint pain (especially left ankle) - with mildly elevated ANA (1:80)    Multiple social stressors    New onset itching - steroids started (topical and systemic)   Plan/Recommendations:   1. Itching and hives - I am glad that things are under better control. - I would prefer to avoid the prednisone  given the bad side effects.  - Continue with triamcinolone  0.1% cream twice daily applied in a thin layer to affected lesions. - Continue with Zyrtec  1-2  tablets twice daily. - Continue with hydroxyzine  25mg  at night to help with sleeping.  - We cna consider adding on Dupixent to help with itching and hive control.   2. Moderate persistent asthma, uncomplicated - Lung testing looks good today.  - Daily controller medication(s): Symbicort  160/4.34mcg two puffs twice daily with spacer - Prior to physical activity: AirSupra  2 puffs 10-15 minutes before physical activity. - Rescue medications: AirSupra  2 puffs every 4-6 hours as needed - Asthma control goals:  * Full participation in all desired activities (may need albuterol  before activity) * Albuterol  use two time or less a week on average (not counting use with activity) * Cough interfering with sleep two time or less a month * Oral steroids no more than once a year * No hospitalizations  2. Seasonal allergic rhinitis due to pollen (grasses, ragweed, weeds, and trees.) - Continue taking: Zyrtec  (cetirizine ) 10mg  tablet once daily (or Allegra) and Flonase  (fluticasone ) one spray per nostril daily - You can use an extra dose of  the antihistamine, if needed, for breakthrough symptoms.  - Consider nasal saline rinses 1-2 times daily to remove allergens from the nasal cavities as well as help with mucous clearance (this is especially helpful to do before the nasal sprays are given)  3. Return in about 6 months (around 01/18/2025). You can have the follow up appointment with Dr. Iva or a Nurse Practicioner (our Nurse Practitioners are excellent and always have Physician oversight).   Subjective:   Randall Meadows is a 48 y.o. male presenting today for follow up of  Chief Complaint  Patient presents with   Follow-up    Dealing with flees in the home, has been experiencing some itching.    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  Discussed the use of AI scribe software for clinical note transcription with the patient and/or guardian, who gave verbal consent to proceed.  Randall Meadows is a 48 y.o. male presenting for a follow up visit.  He was last seen in August 2025.  At that time, was given a Depo-Medrol  for itching and hives.  Restarted prednisone  as well as triamcinolone .  We increased his  Zyrtec  to 2 tablets twice daily and added hydroxyzine .  Lung testing looked great.  Will continue with Symbicort  160 mcg 2 puffs twice daily.  For his allergic rhinitis, he continues Zyrtec  as well as Flonase .  Since last visit, he has done much better.  Asthma/Respiratory Symptom History: He is currently using Symbicort  and a nasal spray, both of which he needs  refills for.  He has not been using albuterol  frequently.  Overall, asthma is under good control. Randall Meadows's asthma has been well controlled. He has not required rescue medication, experienced nocturnal awakenings due to lower respiratory symptoms, nor have activities of daily living been limited. He has required no Emergency Department or Urgent Care visits for his asthma. He has required zero courses of systemic steroids for asthma exacerbations since the last visit. ACT score today is 25, indicating excellent asthma symptom control.   Allergic Rhinitis Symptom History: He remains on the Zyrtec  as well as the Flonase .  He alternates antihistamines occasionally.  We have talked about allergy shots, but he does not think his symptoms are that bad.  Skin Symptom History: He experiences itching primarily on his legs due to flea bites, which he attributes to a dog in his building that likely has fleas. His own dogs are protected. The itching began after stepping in grass where the dog had been. The severity of the itching has decreased, and he has been using a cream twice daily, which has helped significantly. He has been using hydroxyzine  for itch control, which has also improved his sleep. He previously used lorazepam but has since stopped taking it. He is in the process of switching to a new primary care doctor due to dissatisfaction with his current provider. He also has triamcinolone  cream, which is in the process of being refilled.  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 He experiences back pain localized to the lumbar region and has been told he has a little bit of arthritis. This pain affects his left leg. He has been advised to undergo further diagnostic tests but is hesitant to proceed.  Otherwise, there have been no changes to his past medical history, surgical history, family history, or social history.    Review of systems otherwise negative other than that mentioned in the  HPI.    Objective:   Blood pressure 130/80, pulse 66, temperature 97.8 F (36.6 C), temperature source Temporal, resp. rate 18, height 5' 10.47 (1.79 m), weight 252 lb (114.3 kg), SpO2 95%. Body mass index is 35.68 kg/m.    Physical Exam Vitals reviewed.  Constitutional:      Appearance: He is well-developed.     Comments: Very talkative as always.   HENT:     Head: Normocephalic and atraumatic.     Right Ear: Tympanic membrane, ear canal and external ear normal. No drainage, swelling or tenderness. Tympanic membrane is not injected, scarred, erythematous, retracted or bulging.     Left Ear: Tympanic membrane, ear canal and external ear normal. No drainage, swelling or tenderness. Tympanic membrane is not injected, scarred, erythematous, retracted or bulging.     Nose: No nasal deformity, septal deviation, mucosal edema or rhinorrhea.     Right Turbinates: Enlarged, swollen and pale.     Left Turbinates: Enlarged, swollen and pale.     Right Sinus: No maxillary sinus tenderness or frontal sinus tenderness.     Left Sinus: No maxillary sinus tenderness or frontal sinus tenderness.     Mouth/Throat:  Mouth: Mucous membranes are not pale and not dry.     Pharynx: Uvula midline.  Eyes:     General: Allergic shiner present.        Right eye: No discharge.        Left eye: No discharge.     Conjunctiva/sclera: Conjunctivae normal.     Right eye: Right conjunctiva is not injected. No chemosis.    Left eye: Left conjunctiva is not injected. No chemosis.    Pupils: Pupils are equal, round, and reactive to light.  Cardiovascular:     Rate and Rhythm: Normal rate and regular rhythm.     Heart sounds: Normal heart sounds.  Pulmonary:     Effort: Pulmonary effort is normal. No tachypnea, accessory muscle usage or respiratory distress.     Breath sounds: Normal breath sounds. No wheezing, rhonchi or rales.     Comments: Moving air well in all lung fields.  No increased work of  breathing. Chest:     Chest wall: No tenderness.  Abdominal:     Tenderness: There is no abdominal tenderness. There is no guarding or rebound.  Musculoskeletal:     Comments: Tenderness over the dorsum of the left foot.   Lymphadenopathy:     Head:     Right side of head: No submandibular, tonsillar or occipital adenopathy.     Left side of head: No submandibular, tonsillar or occipital adenopathy.     Cervical: No cervical adenopathy.  Skin:    General: Skin is warm.     Capillary Refill: Capillary refill takes less than 2 seconds.     Coloration: Skin is not pale.     Findings: No abrasion, erythema, petechiae or rash. Rash is not papular, urticarial or vesicular.     Comments: He has several hyperpigmented raised lesions on the bilateral arms.  He also has similar-appearing lesions on the bilateral legs.  They almost appear like prurigo nodularis.  they might be a bit more smooth this week.  Neurological:     Mental Status: He is alert.  Psychiatric:        Behavior: Behavior is cooperative.      Diagnostic studies:    Spirometry: results normal (FEV1: 2.86/86%, FVC: 3.65/87%, FEV1/FVC: 78%).    Spirometry consistent with normal pattern.    Allergy Studies: none       Marty Shaggy, MD  [facility] 

## 2024-08-02 NOTE — Progress Notes (Signed)
 Referring Physician:  Henry Ingle, MD [address] [place],  [place] [address]  Primary Physician:  Henry Ingle, MD  History of Present Illness: 08/09/2024 Randall Meadows has a history of HTN, asthma, obesity, anxiety.  He has seen Dr. Ozell Ada (ortho spine) and CT myelogram was ordered. Patient cancelled it as he was concerned about possible complications.   He has a shunt was told that he cannot have MRI.   10 month history of constant LBP with bilateral buttock pain and intermittent posterior left leg pain to his knee and constant pain from knees to feet bilaterally (entire leg). LBP > leg pain, left leg pain > right leg pain. He has numbness and tingling in left leg and foot. He has weakness in both legs. Pain is worse with prolonged sitting/walking and driving. Some relief with heat.   He is on oxycodone  and gabapentin .   Tobacco use: Does not smoke.   Bowel/Bladder Dysfunction: none  Conservative measures:  Physical therapy: initial evaluation on 10/04/22 with 7 visits and he was discharged on 11/20/22 Multimodal medical therapy including regular antiinflammatories:  Hydrocodone, Oxycodone , Gabapentin , Ibuprofen, Tramadol, Flexeril Injections:  no epidural steroid injections  Past Surgery: no spine surgery  Randall Meadows has no symptoms of cervical myelopathy.  The symptoms are causing a significant impact on the patient's life.   Review of Systems:  A 10 point review of systems is negative, except for the pertinent positives and negatives detailed in the HPI.  Past Medical History: Past Medical History:  Diagnosis Date   Asthma    Hypertension     Past Surgical History: Past Surgical History:  Procedure Laterality Date   TONSILLECTOMY      Allergies: Allergies as of 08/09/2024 - Review Complete 08/09/2024  Allergen Reaction Noted   Grass pollen([id number] vern) Itching 07/03/2023    Medications: Outpatient Encounter Medications  as of 08/09/2024  Medication Sig   albuterol  (VENTOLIN  HFA) 108 (90 Base) MCG/ACT inhaler Inhale 1-2 puffs into the lungs every 4 (four) hours as needed for wheezing or shortness of breath (cough, shortness of breath or wheezing.).   benzonatate  (TESSALON ) 100 MG capsule Take 1 capsule (100 mg total) by mouth every 8 (eight) hours.   budesonide -formoterol  (SYMBICORT ) 160-4.5 MCG/ACT inhaler Inhale 2 puffs into the lungs daily.   cetirizine  (ZYRTEC ) 10 MG tablet Take 1 tablet (10 mg total) by mouth daily as needed for allergies.   donepezil (ARICEPT) 5 MG tablet Take 5 mg by mouth daily.   fluticasone  (FLONASE ) 50 MCG/ACT nasal spray Place 2 sprays into both nostrils daily as needed.   gabapentin  (NEURONTIN ) 100 MG capsule TAKE 1 CAPSULE BY MOUTH THREE TIMES DAILY   hydrOXYzine  (ATARAX ) 25 MG tablet Take 1 tablet (25 mg total) by mouth at bedtime.   lisinopril-hydrochlorothiazide (ZESTORETIC) 20-25 MG tablet Take 1 tablet by mouth daily.   metoprolol  tartrate (LOPRESSOR ) 25 MG tablet TAKE 1/2 TABLET BY MOUTH TWICE DAILY   oxyCODONE  (ROXICODONE ) 15 MG immediate release tablet Take 15 mg by mouth 3 (three) times daily.   triamcinolone  cream (KENALOG ) 0.1 % Apply a thin layer to the affected areas twice daily. DO NOT use on the face.   No facility-administered encounter medications on file as of 08/09/2024.    Social History: Social History   Tobacco Use   Smoking status: Never   Smokeless tobacco: Never  Vaping Use   Vaping status: Never Used  Substance Use Topics   Alcohol use: Never  Drug use: Never    Family Medical History: Family History  Problem Relation Age of Onset   Urticaria Maternal Grandmother    Food Allergy Maternal Grandmother    Allergic rhinitis Son    Allergic rhinitis Son     Physical Examination: Vitals:   08/09/24 0922  BP: 118/76    General: Patient is well developed, well nourished, calm, collected, and in no apparent distress. Attention to  examination is appropriate.  Respiratory: Patient is breathing without any difficulty.   NEUROLOGICAL:     Awake, alert, oriented to person, place, and time.  Speech is clear and fluent. Fund of knowledge is appropriate.   Cranial Nerves: Pupils equal round and reactive to light.  Facial tone is symmetric.    No posterior lumbar tenderness.   No abnormal lesions on exposed skin.   Strength: Side Biceps Triceps Deltoid Interossei Grip Wrist Ext. Wrist Flex.  R 5 5 5 5 5 5 5   L 5 5 5 5 5 5 5    Side Iliopsoas Quads Hamstring PF DF EHL  R 5 5 5 5 5 5   L 5 5 5 5 5 5    Reflexes are 2+ and symmetric at the biceps, brachioradialis, patella and achilles.   Hoffman's is absent.  Clonus is not present.   Bilateral upper and lower extremity sensation is intact to light touch.     No pain with IR/ER of both hips.   Gait is normal.     Medical Decision Making  Imaging: Lumbar xrays dated 11/27/23:  Good gross maintenance of disc space.   No report available for above lumbar xrays.     Lumbar spine CT dated 06/18/23:  FINDINGS: Segmentation: 5 lumbar type vertebrae.   Alignment: Normal.   Vertebrae: No acute fracture or focal pathologic process.   Paraspinal and other soft tissues: Negative.   Disc levels: Disc spaces are maintained.   IMPRESSION: 1. No osseous abnormalities identified. 2. Consider MRI for further assessment for disc disease as potential etiology of radicular symptoms, if indicated.     Electronically Signed   By: Fonda Field M.D.   On: 07/01/2023 12:31  I have personally reviewed the images and agree with the above interpretation.  Assessment and Plan: Randall Meadows has a 10 month history of constant LBP with bilateral buttock pain and intermittent posterior left leg pain to his knee and constant pain from knees to feet bilaterally (entire leg). LBP > leg pain, left leg pain > right leg pain. He has numbness and tingling in left leg and foot.  He has weakness in both legs.   Previous lumbar xrays and MRI looked good. 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   Treatment options discussed with patient and following plan made:   - Shunt series xrays ordered. Will review with Dr. Claudene to see if he is able to have lumbar MRI.  - Will message him once Dr. Claudene reviews his xrays.  - If he is unable to have MRI, he does not want to have CT myelogram. Would get lumbar CT scan.  - Will schedule follow up with me to review imaging once I get results back.   I spent a total of 35 minutes in face-to-face and non-face-to-face activities related to this patient's care today including review of outside records, review of imaging, review of symptoms, physical exam, discussion of differential diagnosis, discussion of treatment options,  and documentation.   Thank you for involving me in the care of this patient.   Glade Boys PA-C Dept. of Neurosurgery

## 2024-08-03 NOTE — Telephone Encounter (Signed)
 I called Randall Meadows to follow up on the Rheumatology referral.  He states he is scheduled with Dr. Lenor for 10/27.

## 2024-08-09 ENCOUNTER — Encounter: Payer: Self-pay | Admitting: Orthopedic Surgery

## 2024-08-09 ENCOUNTER — Ambulatory Visit

## 2024-08-09 ENCOUNTER — Ambulatory Visit (INDEPENDENT_AMBULATORY_CARE_PROVIDER_SITE_OTHER): Admitting: Orthopedic Surgery

## 2024-08-09 VITALS — BP 118/76 | Ht 69.0 in | Wt 253.2 lb

## 2024-08-09 DIAGNOSIS — M545 Low back pain, unspecified: Secondary | ICD-10-CM

## 2024-08-09 DIAGNOSIS — M79605 Pain in left leg: Secondary | ICD-10-CM | POA: Diagnosis not present

## 2024-08-09 DIAGNOSIS — M79604 Pain in right leg: Secondary | ICD-10-CM

## 2024-08-09 DIAGNOSIS — M5441 Lumbago with sciatica, right side: Secondary | ICD-10-CM

## 2024-08-09 DIAGNOSIS — M5416 Radiculopathy, lumbar region: Secondary | ICD-10-CM

## 2024-08-09 DIAGNOSIS — Z982 Presence of cerebrospinal fluid drainage device: Secondary | ICD-10-CM | POA: Diagnosis not present

## 2024-08-09 DIAGNOSIS — R29898 Other symptoms and signs involving the musculoskeletal system: Secondary | ICD-10-CM

## 2024-08-09 NOTE — Patient Instructions (Signed)
 It was so nice to see you today. Thank you so much for coming in.    I want to get some xrays of your shunt today so we can see if you can have an MRI scan.   I will have Dr. Claudene review the xrays and message you with a follow up.   If we can do the MRI, we will order it in [place] and I will see you back after it is done to review the results.   Please do not hesitate to call if you have any questions or concerns. You can also message me in MyChart.   Glade Boys PA-C [phone]     The physicians and staff at [facility] at [facility] are committed to providing excellent care. You may receive a survey asking for feedback about your experience at our office. We value you your feedback and appreciate you taking the time to to fill it out. The [facility] leadership team is also available to discuss your experience in person, feel free to contact us  [phone].

## 2024-08-10 ENCOUNTER — Telehealth: Payer: Self-pay | Admitting: Orthopedic Surgery

## 2024-08-10 NOTE — Telephone Encounter (Signed)
 Pt informed us  that he changed pcps & he will not see his new pcp until November. He stated that you had sent over notes & he wanted to make sure it didn't go to Dr. Tanda Lemond Rummer.

## 2024-08-10 NOTE — Telephone Encounter (Signed)
 I'm not sure what PCP was in system when I finished his note.   His new PCP is in [facility] system and will be able to see my notes in the system. Please let him know.

## 2024-08-11 ENCOUNTER — Ambulatory Visit: Admitting: Allergy & Immunology

## 2024-08-12 DIAGNOSIS — Z982 Presence of cerebrospinal fluid drainage device: Secondary | ICD-10-CM

## 2024-08-12 DIAGNOSIS — M5416 Radiculopathy, lumbar region: Secondary | ICD-10-CM

## 2024-08-12 DIAGNOSIS — M5441 Lumbago with sciatica, right side: Secondary | ICD-10-CM

## 2024-08-24 ENCOUNTER — Other Ambulatory Visit: Payer: Self-pay | Admitting: Orthopedic Surgery

## 2024-08-24 DIAGNOSIS — M545 Low back pain, unspecified: Secondary | ICD-10-CM

## 2024-08-24 DIAGNOSIS — M5410 Radiculopathy, site unspecified: Secondary | ICD-10-CM

## 2024-08-28 ENCOUNTER — Ambulatory Visit (HOSPITAL_COMMUNITY)
Admission: RE | Admit: 2024-08-28 | Discharge: 2024-08-28 | Disposition: A | Discharge status: Home / Self Care | Source: Ambulatory Visit | Attending: Orthopedic Surgery | Admitting: Orthopedic Surgery

## 2024-08-28 DIAGNOSIS — Z982 Presence of cerebrospinal fluid drainage device: Secondary | ICD-10-CM | POA: Diagnosis present

## 2024-08-28 DIAGNOSIS — M5441 Lumbago with sciatica, right side: Secondary | ICD-10-CM | POA: Insufficient documentation

## 2024-08-28 DIAGNOSIS — M5442 Lumbago with sciatica, left side: Secondary | ICD-10-CM | POA: Diagnosis present

## 2024-08-28 DIAGNOSIS — M5416 Radiculopathy, lumbar region: Secondary | ICD-10-CM | POA: Insufficient documentation

## 2024-08-30 ENCOUNTER — Ambulatory Visit (INDEPENDENT_AMBULATORY_CARE_PROVIDER_SITE_OTHER): Admitting: Family Medicine

## 2024-08-30 ENCOUNTER — Encounter: Payer: Self-pay | Admitting: Radiology

## 2024-08-30 ENCOUNTER — Encounter: Payer: Self-pay | Admitting: Family Medicine

## 2024-08-30 VITALS — BP 118/78 | HR 62 | Temp 97.7°F | Ht 69.0 in | Wt 248.0 lb

## 2024-08-30 DIAGNOSIS — R413 Other amnesia: Secondary | ICD-10-CM | POA: Diagnosis not present

## 2024-08-30 DIAGNOSIS — Z114 Encounter for screening for human immunodeficiency virus [HIV]: Secondary | ICD-10-CM

## 2024-08-30 DIAGNOSIS — E66812 Obesity, class 2: Secondary | ICD-10-CM | POA: Diagnosis not present

## 2024-08-30 DIAGNOSIS — Z1211 Encounter for screening for malignant neoplasm of colon: Secondary | ICD-10-CM

## 2024-08-30 DIAGNOSIS — Z7689 Persons encountering health services in other specified circumstances: Secondary | ICD-10-CM

## 2024-08-30 DIAGNOSIS — I10 Essential (primary) hypertension: Secondary | ICD-10-CM | POA: Diagnosis not present

## 2024-08-30 DIAGNOSIS — F411 Generalized anxiety disorder: Secondary | ICD-10-CM

## 2024-08-30 DIAGNOSIS — Z1159 Encounter for screening for other viral diseases: Secondary | ICD-10-CM

## 2024-08-30 DIAGNOSIS — R519 Headache, unspecified: Secondary | ICD-10-CM

## 2024-08-30 LAB — TSH: TSH: 0.26 u[IU]/mL — ABNORMAL LOW (ref 0.35–5.50)

## 2024-08-30 LAB — CBC WITH DIFFERENTIAL/PLATELET
Basophils Absolute: 0 K/uL (ref 0.0–0.1)
Basophils Relative: 0.3 % (ref 0.0–3.0)
Eosinophils Absolute: 0 K/uL (ref 0.0–0.7)
Eosinophils Relative: 0.4 % (ref 0.0–5.0)
HCT: 46.8 % (ref 39.0–52.0)
Hemoglobin: 15.1 g/dL (ref 13.0–17.0)
Lymphocytes Relative: 39.5 % (ref 12.0–46.0)
Lymphs Abs: 2.5 K/uL (ref 0.7–4.0)
MCHC: 32.3 g/dL (ref 30.0–36.0)
MCV: 88.8 fl (ref 78.0–100.0)
Monocytes Absolute: 0.5 K/uL (ref 0.1–1.0)
Monocytes Relative: 8.1 % (ref 3.0–12.0)
Neutro Abs: 3.3 K/uL (ref 1.4–7.7)
Neutrophils Relative %: 51.7 % (ref 43.0–77.0)
Platelets: 234 K/uL (ref 150.0–400.0)
RBC: 5.27 Mil/uL (ref 4.22–5.81)
RDW: 15.1 % (ref 11.5–15.5)
WBC: 6.4 K/uL (ref 4.0–10.5)

## 2024-08-30 LAB — COMPREHENSIVE METABOLIC PANEL WITH GFR
ALT: 17 U/L (ref 0–53)
AST: 20 U/L (ref 0–37)
Albumin: 4.6 g/dL (ref 3.5–5.2)
Alkaline Phosphatase: 73 U/L (ref 39–117)
BUN: 14 mg/dL (ref 6–23)
CO2: 31 meq/L (ref 19–32)
Calcium: 10 mg/dL (ref 8.4–10.5)
Chloride: 100 meq/L (ref 96–112)
Creatinine, Ser: 1.14 mg/dL (ref 0.40–1.50)
GFR: 75.01 mL/min (ref >60.00)
Glucose, Bld: 101 mg/dL — ABNORMAL HIGH (ref 70–99)
Potassium: 3.8 meq/L (ref 3.5–5.1)
Sodium: 141 meq/L (ref 135–145)
Total Bilirubin: 0.8 mg/dL (ref 0.2–1.2)
Total Protein: 8.5 g/dL — ABNORMAL HIGH (ref 6.0–8.3)

## 2024-08-30 LAB — VITAMIN D 25 HYDROXY (VIT D DEFICIENCY, FRACTURES): VITD: 17.64 ng/mL — ABNORMAL LOW (ref 30.00–100.00)

## 2024-08-30 LAB — LIPID PANEL
Cholesterol: 214 mg/dL — ABNORMAL HIGH (ref 0–200)
HDL: 72.7 mg/dL (ref >39.00)
LDL Cholesterol: 128 mg/dL — ABNORMAL HIGH (ref 0–99)
NonHDL: 141.58
Total CHOL/HDL Ratio: 3
Triglycerides: 68 mg/dL (ref 0.0–149.0)
VLDL: 13.6 mg/dL (ref 0.0–40.0)

## 2024-08-30 LAB — VITAMIN B12: Vitamin B-12: 224 pg/mL (ref 211–911)

## 2024-08-30 LAB — FOLATE: Folate: 12.4 ng/mL (ref >5.9)

## 2024-08-30 LAB — HEMOGLOBIN A1C: Hgb A1c MFr Bld: 6.1 % (ref 4.6–6.5)

## 2024-08-30 NOTE — Patient Instructions (Addendum)
-  It was nice to meet you and look forward to taking care of you. -May obtain Tdap (tetanus) vaccine, pneumonia vaccine, and Zoster vaccine at your local pharmacy.  -Ordered cologuard for colon cancer screening.  -Please monitor blood pressure twice daily with an upper arm blood pressure cuff. Record readings and bring to next appointment in 2 weeks.  -Recommend to ask neurosurgery about needing referral or further workup with Hilma, PA with [facility]. On the back side, I will send a message to Harlene Hilma, [place]. -Ordered labs. Office will call with lab results and will be available via MyChart. -Continue all medications.  -Will complete a letter to have animal support for anxiety.  -Follow up in 2 weeks and needs AWV visit with Rojelio, LPN.

## 2024-08-30 NOTE — Progress Notes (Unsigned)
 New Patient Office Visit  Subjective   Patient ID: Randall Meadows, male    DOB: 1975-12-23  Age: 48 y.o. MRN: [id number]  CC:  Chief Complaint  Patient presents with  . Establish Care    HPI [name] presents to establish care with new provider.   Patients previous primary care provider: Dr. Tanda Rummer MD in [place]. Last seen 07/06/2024.   Specialist: [employer] [facility] at [facility] with Glade Boys, [place] [facility] [place] with Dr. Marty Shaggy  [facility]e [facility] with Dr. Taft Minerva  [facility] with Dr. Shelda Bruckner (Last seen 2022) Previously seen once-[facility] with Dr. Redell Ness   Memory impairment: Patient is taking taking Donepezil 5mg  daily and has been taking this medication since 2023. He reports he never had a work up for his memory impairment. He reports he has been taking medication intermittent under previous PCPs recommendation. He reports he has trouble with short term memory. For example, it is hard for him to remember to complete a task that was just asked of him or can't remember where he puts things, like his keys or wallet. No brain imaging completed with previous PCP and having memory impairment.   HTN: Chronic. Patient is taking Lisinopril-HCTZ 20-25mg  daily. Monitoring BP at home. 125-200/75-100. Denies recent CP, SHOB, dizziness. Having headaches-more when severely stressed.   Angina-Patient is taking Metoprolol  Tartrate 12.5mg  BID. Initially cardiology prescribed.    Outpatient Encounter Medications as of 08/30/2024  Medication Sig  . albuterol  (VENTOLIN  HFA) 108 (90 Base) MCG/ACT inhaler Inhale 1-2 puffs into the lungs every 4 (four) hours as needed for wheezing or shortness of breath (cough, shortness of breath or wheezing.).  . budesonide -formoterol  (SYMBICORT ) 160-4.5  MCG/ACT inhaler Inhale 2 puffs into the lungs daily.  . cetirizine  (ZYRTEC ) 10 MG tablet Take 1 tablet (10 mg total) by mouth daily as needed for allergies.  SABRA donepezil (ARICEPT) 5 MG tablet Take 5 mg by mouth daily.  . fluticasone  (FLONASE ) 50 MCG/ACT nasal spray Place 2 sprays into both nostrils daily as needed.  . gabapentin  (NEURONTIN ) 100 MG capsule TAKE 1 CAPSULE BY MOUTH THREE TIMES DAILY  . hydrOXYzine  (ATARAX ) 25 MG tablet Take 1 tablet (25 mg total) by mouth at bedtime.  SABRA lisinopril-hydrochlorothiazide (ZESTORETIC) 20-25 MG tablet Take 1 tablet by mouth daily.  . metoprolol  tartrate (LOPRESSOR ) 25 MG tablet TAKE 1/2 TABLET BY MOUTH TWICE DAILY  . triamcinolone  cream (KENALOG ) 0.1 % Apply a thin layer to the affected areas twice daily. DO NOT use on the face.  . benzonatate  (TESSALON ) 100 MG capsule Take 1 capsule (100 mg total) by mouth every 8 (eight) hours. (Patient not taking: Reported on 08/30/2024)  . [DISCONTINUED] oxyCODONE  (ROXICODONE ) 15 MG immediate release tablet Take 15 mg by mouth 3 (three) times daily.   No facility-administered encounter medications on file as of 08/30/2024.    Past Medical History:  Diagnosis Date  . Anxiety   . Asthma   . Congenital hydrocephaly (HCC)   . Depression   . Hypertension   . Memory impairment   . Sciatica     Past Surgical History:  Procedure Laterality Date  . BRAIN SURGERY     Shunt from Hydrocephaly  . TONSILLECTOMY      Family History  Problem Relation Age of Onset  . Hypertension Mother   . Heart disease Mother   .  Diabetes Mother   . Depression Son   . Anxiety disorder Son   . Allergic rhinitis Son   . Depression Son   . Anxiety disorder Son   . Allergic rhinitis Son   . Bipolar disorder Maternal Aunt   . Schizophrenia Maternal Aunt   . Urticaria Maternal Grandmother   . Food Allergy Maternal Grandmother   . Heart disease Maternal Grandmother   . Hypertension Maternal Grandfather     Social History    Socioeconomic History  . Marital status: Married    Spouse name: Not on file  . Number of children: 2  . Years of education: Not on file  . Highest education level: Master's degree (e.g., MA, MS, MEng, MEd, MSW, MBA)  Occupational History  . Not on file  Tobacco Use  . Smoking status: Never  . Smokeless tobacco: Never  Vaping Use  . Vaping status: Never Used  Substance and Sexual Activity  . Alcohol use: Never  . Drug use: Never  . Sexual activity: Yes  Other Topics Concern  . Not on file  Social History Narrative  . Not on file   Social Drivers of Health   Financial Resource Strain: Low Risk  (08/30/2024)   Overall Financial Resource Strain (CARDIA)   . Difficulty of Paying Living Expenses: Not hard at all  Food Insecurity: No Food Insecurity (08/30/2024)   Hunger Vital Sign   . Worried About Programme Researcher, Broadcasting/film/video in the Last Year: Never true   . Ran Out of Food in the Last Year: Never true  Transportation Needs: No Transportation Needs (08/30/2024)   PRAPARE - Transportation   . Lack of Transportation (Medical): No   . Lack of Transportation (Non-Medical): No  Physical Activity: Sufficiently Active (08/30/2024)   Exercise Vital Sign   . Days of Exercise per Week: 6 days   . Minutes of Exercise per Session: 50 min  Stress: Stress Concern Present (08/30/2024)   [employer] of Occupational Health - Occupational Stress Questionnaire   . Feeling of Stress: Rather much  Social Connections: Moderately Integrated (08/30/2024)   Social Connection and Isolation Panel   . Frequency of Communication with Friends and Family: More than three times a week   . Frequency of Social Gatherings with Friends and Family: Twice a week   . Attends Religious Services: More than 4 times per year   . Active Member of Clubs or Organizations: No   . Attends Banker Meetings: Never   . Marital Status: Married  Catering Manager Violence: Not At Risk (08/30/2024)   Humiliation,  Afraid, Rape, and Kick questionnaire   . Fear of Current or Ex-Partner: No   . Emotionally Abused: No   . Physically Abused: No   . Sexually Abused: No    ROS See HPI above    Objective  BP 118/78   Pulse 62   Temp 97.7 F (36.5 C) (Oral)   Ht 5' 9 (1.753 m)   Wt 248 lb (112.5 kg)   SpO2 98%   BMI 36.62 kg/m   Physical Exam    Assessment & Plan:  Encounter to establish care  Memory impairment -     CBC with Differential/Platelet -     Comprehensive metabolic panel with GFR -     TSH -     VITAMIN D 25 Hydroxy (Vit-D Deficiency, Fractures) -     Vitamin B12 -     Folate  Colon cancer screening -  Cologuard  Class 2 severe obesity due to excess calories with serious comorbidity and body mass index (BMI) of 36.0 to 36.9 in adult -     CBC with Differential/Platelet -     Comprehensive metabolic panel with GFR -     Hemoglobin A1c -     Lipid panel -     TSH -     VITAMIN D 25 Hydroxy (Vit-D Deficiency, Fractures)  Need for hepatitis C screening test -     Hepatitis C antibody  Encounter for screening for HIV -     HIV Antibody (routine testing w rflx)  1.Review health maintenance:  -Covid vaccine: May obtain at local pharmacy -AWV: Needs appointment   -HIV and Hep C screening: Ordered  -Tdap vaccine: May obtain at local pharmacy  -PNA vaccine: May obtain at local pharmacy  -Cologuard: Ordered  -Hep B vaccine: Unknown  -Zoster vaccine: May obtain at local pharmacy  -Influenza vaccine: In September   Return in about 2 weeks (around 09/13/2024) for follow-up; AWV with Rojelio, LPN .   Keali Mccraw, NP

## 2024-08-31 LAB — HIV ANTIBODY (ROUTINE TESTING W REFLEX)
HIV 1&2 Ab, 4th Generation: NONREACTIVE
HIV FINAL INTERPRETATION: NEGATIVE

## 2024-08-31 LAB — HEPATITIS C ANTIBODY: Hepatitis C Ab: NONREACTIVE

## 2024-09-02 ENCOUNTER — Telehealth (HOSPITAL_BASED_OUTPATIENT_CLINIC_OR_DEPARTMENT_OTHER): Payer: Self-pay | Admitting: Cardiology

## 2024-09-02 ENCOUNTER — Ambulatory Visit: Payer: Self-pay | Admitting: Family Medicine

## 2024-09-02 DIAGNOSIS — E559 Vitamin D deficiency, unspecified: Secondary | ICD-10-CM

## 2024-09-02 DIAGNOSIS — R195 Other fecal abnormalities: Secondary | ICD-10-CM

## 2024-09-02 DIAGNOSIS — R7989 Other specified abnormal findings of blood chemistry: Secondary | ICD-10-CM

## 2024-09-02 MED ORDER — VITAMIN D3 1.25 MG (50000 UT) PO CAPS: 1.2500 mg | ORAL_CAPSULE | ORAL | 0 refills | Status: AC

## 2024-09-02 NOTE — Telephone Encounter (Signed)
  Pt c/o of Chest Pain: STAT if active CP, including tightness, pressure, jaw pain, radiating pain to shoulder/upper arm/back, CP unrelieved by Nitro. Symptoms reported of SOB, nausea, vomiting, sweating.  1. Are you having CP right now?   Yes   2. Are you experiencing any other symptoms (ex. SOB, nausea, vomiting, sweating)?   Headache  3. Is your CP continuous or coming and going?   Coming and going  4. Have you taken Nitroglycerin ?  No  5. How long have you been experiencing CP?   Patient stated chest pain started a couple of weeks ago  6. If NO CP at time of call then end call with telling Pt to call back or call 911 if Chest pain returns prior to return call from triage team.   Patient is concerned he has been having chest pains and headache and wants advice on next steps.

## 2024-09-02 NOTE — Telephone Encounter (Signed)
 Spoke with patient who stated he has been having intermittent chest pains over the last couple of weeks.  Per patient radiates to his left arm, worse when presses on it  Has been coming and going  Denies chest pains currently  Worse with exertion, but comes on without exertion when just sitting there  Scheduled patient an appointment with Dr Lonni  Advised patient to go to ED if chest pain returns and does not go away with rest   Message sent to scheduling team to get patient added to cancellation list   Will forward to Dr Lonni for review

## 2024-09-02 NOTE — Assessment & Plan Note (Addendum)
 Blood pressure stable in office today. However, patient reports his blood pressure has been elevated at home. Recommend to please monitor blood pressure twice daily with an upper arm blood pressure cuff. Record readings and bring to next appointment in 2 weeks. Continue Lisinopril-HCTZ 20-25mg  daily. Order CMP to assess kidney function and potassium level.

## 2024-09-03 ENCOUNTER — Ambulatory Visit

## 2024-09-03 ENCOUNTER — Other Ambulatory Visit: Payer: Self-pay

## 2024-09-03 DIAGNOSIS — R413 Other amnesia: Secondary | ICD-10-CM

## 2024-09-03 DIAGNOSIS — R519 Headache, unspecified: Secondary | ICD-10-CM

## 2024-09-03 NOTE — Addendum Note (Signed)
 Addended by: ELNER NANNY B on: 09/03/2024 01:29 PM   Modules accepted: Orders

## 2024-09-05 ENCOUNTER — Emergency Department (HOSPITAL_COMMUNITY)

## 2024-09-05 ENCOUNTER — Other Ambulatory Visit: Payer: Self-pay

## 2024-09-05 ENCOUNTER — Emergency Department (HOSPITAL_COMMUNITY)
Admission: EM | Admit: 2024-09-05 | Discharge: 2024-09-05 | Disposition: A | Discharge status: Home / Self Care | Attending: Emergency Medicine | Admitting: Emergency Medicine

## 2024-09-05 DIAGNOSIS — R0789 Other chest pain: Secondary | ICD-10-CM | POA: Diagnosis present

## 2024-09-05 DIAGNOSIS — E876 Hypokalemia: Secondary | ICD-10-CM | POA: Insufficient documentation

## 2024-09-05 LAB — COMPREHENSIVE METABOLIC PANEL WITH GFR
ALT: 16 U/L (ref 0–44)
AST: 24 U/L (ref 15–41)
Albumin: 4.4 g/dL (ref 3.5–5.0)
Alkaline Phosphatase: 69 U/L (ref 38–126)
Anion gap: 13 (ref 5–15)
BUN: 19 mg/dL (ref 6–20)
CO2: 28 mmol/L (ref 22–32)
Calcium: 9.4 mg/dL (ref 8.9–10.3)
Chloride: 100 mmol/L (ref 98–111)
Creatinine, Ser: 1.23 mg/dL (ref 0.61–1.24)
GFR, Estimated: >60 mL/min (ref >60)
Glucose, Bld: 111 mg/dL — ABNORMAL HIGH (ref 70–99)
Potassium: 3.1 mmol/L — ABNORMAL LOW (ref 3.5–5.1)
Sodium: 141 mmol/L (ref 135–145)
Total Bilirubin: 0.8 mg/dL (ref 0.0–1.2)
Total Protein: 7.6 g/dL (ref 6.5–8.1)

## 2024-09-05 LAB — CBC WITH DIFFERENTIAL/PLATELET
Abs Immature Granulocytes: 0.04 K/uL (ref 0.00–0.07)
Basophils Absolute: 0.1 K/uL (ref 0.0–0.1)
Basophils Relative: 1 %
Eosinophils Absolute: 0.1 K/uL (ref 0.0–0.5)
Eosinophils Relative: 1 %
HCT: 42.8 % (ref 39.0–52.0)
Hemoglobin: 14.3 g/dL (ref 13.0–17.0)
Immature Granulocytes: 1 %
Lymphocytes Relative: 53 %
Lymphs Abs: 4.3 K/uL — ABNORMAL HIGH (ref 0.7–4.0)
MCH: 29.2 pg (ref 26.0–34.0)
MCHC: 33.4 g/dL (ref 30.0–36.0)
MCV: 87.3 fL (ref 80.0–100.0)
Monocytes Absolute: 0.6 K/uL (ref 0.1–1.0)
Monocytes Relative: 7 %
Neutro Abs: 3 K/uL (ref 1.7–7.7)
Neutrophils Relative %: 37 %
Platelets: 225 K/uL (ref 150–400)
RBC: 4.9 MIL/uL (ref 4.22–5.81)
RDW: 13.6 % (ref 11.5–15.5)
WBC: 8.1 K/uL (ref 4.0–10.5)
nRBC: 0 % (ref 0.0–0.2)

## 2024-09-05 LAB — TROPONIN T, HIGH SENSITIVITY
Troponin T High Sensitivity: <15 ng/L (ref 0–19)
Troponin T High Sensitivity: <15 ng/L (ref 0–19)

## 2024-09-05 LAB — D-DIMER, QUANTITATIVE: D-Dimer, Quant: 0.33 ug{FEU}/mL (ref 0.00–0.50)

## 2024-09-05 LAB — MAGNESIUM: Magnesium: 2.1 mg/dL (ref 1.7–2.4)

## 2024-09-05 MED ORDER — ONDANSETRON HCL 4 MG/2ML IJ SOLN
4.0000 mg | Freq: Once | INTRAMUSCULAR | Status: AC
  Administered 2024-09-05: 4 mg via INTRAVENOUS
  Filled 2024-09-05: qty 2

## 2024-09-05 MED ORDER — KETOROLAC TROMETHAMINE 15 MG/ML IJ SOLN
15.0000 mg | Freq: Once | INTRAMUSCULAR | Status: AC
  Administered 2024-09-05: 15 mg via INTRAVENOUS
  Filled 2024-09-05: qty 1

## 2024-09-05 MED ORDER — MORPHINE SULFATE (PF) 4 MG/ML IV SOLN
4.0000 mg | Freq: Once | INTRAVENOUS | Status: AC
  Administered 2024-09-05: 4 mg via INTRAVENOUS
  Filled 2024-09-05: qty 1

## 2024-09-05 MED ORDER — NAPROXEN 500 MG PO TABS: 500.0000 mg | ORAL_TABLET | Freq: Two times a day (BID) | ORAL | 0 refills | Status: DC

## 2024-09-05 MED ORDER — SODIUM CHLORIDE 0.9 % IV BOLUS
1000.0000 mL | Freq: Once | INTRAVENOUS | Status: AC
  Administered 2024-09-05: 1000 mL via INTRAVENOUS

## 2024-09-05 MED ORDER — POTASSIUM CHLORIDE CRYS ER 20 MEQ PO TBCR
40.0000 meq | EXTENDED_RELEASE_TABLET | Freq: Once | ORAL | Status: AC
  Administered 2024-09-05: 40 meq via ORAL
  Filled 2024-09-05: qty 2

## 2024-09-05 MED ORDER — ASPIRIN 81 MG PO CHEW
324.0000 mg | CHEWABLE_TABLET | Freq: Once | ORAL | Status: AC
  Administered 2024-09-05: 324 mg via ORAL
  Filled 2024-09-05: qty 4

## 2024-09-05 NOTE — ED Notes (Addendum)
 Patient comfortable  family at bedside

## 2024-09-05 NOTE — ED Provider Notes (Signed)
 [place] EMERGENCY DEPARTMENT AT [facility] Provider Note   CSN: [id number] Arrival date & time: 09/05/24  8162     Patient presents with: Chest Pain   Randall Meadows is a 48 y.o. male.   Patient is a 48 year old male who presents to the emergency department the chief complaint of intermittent chest pain for approximate the past 2 weeks.  Patient notes that he did have worsening of the pain today with radiation to his left shoulder and arm.  He has had associated shortness of breath as well.  He denies any recent falls or blunt chest wall trauma.  He denies any personal history of CAD but notes he does have a family history of this.  He notes that he has seen a cardiologist previously with negative workups.  He denies any associated dizziness, lightheadedness or syncope.  He has had no abdominal pain, nausea, vomiting, diarrhea.   Chest Pain      Prior to Admission medications   Medication Sig Start Date End Date Taking? Authorizing Provider  albuterol  (VENTOLIN  HFA) 108 (90 Base) MCG/ACT inhaler Inhale 1-2 puffs into the lungs every 4 (four) hours as needed for wheezing or shortness of breath (cough, shortness of breath or wheezing.). 02/04/24   Ambs, Arlean HERO, FNP  benzonatate  (TESSALON ) 100 MG capsule Take 1 capsule (100 mg total) by mouth every 8 (eight) hours. Patient not taking: Reported on 08/30/2024 07/21/24   Iva Marty Saltness, MD  budesonide -formoterol  (SYMBICORT ) 160-4.5 MCG/ACT inhaler Inhale 2 puffs into the lungs daily. 07/21/24   Iva Marty Saltness, MD  cetirizine  (ZYRTEC ) 10 MG tablet Take 1 tablet (10 mg total) by mouth daily as needed for allergies. 06/18/24   Cari Arlean HERO, FNP  Cholecalciferol (VITAMIN D3) 1.25 MG (50000 UT) CAPS Take 1 capsule (1.25 mg total) by mouth every 7 (seven) days. 09/02/24   Billy Philippe SAUNDERS, NP  donepezil (ARICEPT) 5 MG tablet Take 5 mg by mouth daily. 03/26/24   [provider]  fluticasone  (FLONASE ) 50 MCG/ACT  nasal spray Place 2 sprays into both nostrils daily as needed. 07/21/24   Iva Marty Saltness, MD  gabapentin  (NEURONTIN ) 100 MG capsule TAKE 1 CAPSULE BY MOUTH THREE TIMES DAILY 08/24/24   Margrette Taft BRAVO, MD  hydrOXYzine  (ATARAX ) 25 MG tablet Take 1 tablet (25 mg total) by mouth at bedtime. 07/21/24   Iva Marty Saltness, MD  lisinopril-hydrochlorothiazide (ZESTORETIC) 20-25 MG tablet Take 1 tablet by mouth daily. 08/14/20   [provider]  metoprolol  tartrate (LOPRESSOR ) 25 MG tablet TAKE 1/2 TABLET BY MOUTH TWICE DAILY 04/04/23   Lonni Slain, MD  triamcinolone  cream (KENALOG ) 0.1 % Apply a thin layer to the affected areas twice daily. DO NOT use on the face. 06/23/24   Iva Marty Saltness, MD    Allergies: Grass pollen([id number]-[id number] vern)    Review of Systems  Cardiovascular:  Positive for chest pain.  All other systems reviewed and are negative.   Updated Vital Signs BP 124/76   Pulse 60   Temp 98.3 F (36.8 C)   Resp 15   Ht 5' 9 (1.753 m)   Wt 109.7 kg   SpO2 100%   BMI 35.71 kg/m   Physical Exam Vitals and nursing note reviewed.  Constitutional:      General: He is not in acute distress.    Appearance: Normal appearance. He is not ill-appearing.  HENT:     Head: Normocephalic and atraumatic.     Nose: Nose normal.  Mouth/Throat:     Mouth: Mucous membranes are moist.  Eyes:     Extraocular Movements: Extraocular movements intact.     Conjunctiva/sclera: Conjunctivae normal.     Pupils: Pupils are equal, round, and reactive to light.  Cardiovascular:     Rate and Rhythm: Normal rate and regular rhythm.     Pulses: Normal pulses.     Heart sounds: Normal heart sounds. No murmur heard. Pulmonary:     Effort: Pulmonary effort is normal. No tachypnea or respiratory distress.     Breath sounds: Normal breath sounds. No decreased breath sounds, wheezing, rhonchi or rales.  Chest:     Chest wall: Tenderness present.     Comments: Tender to  palpation of the left side of the chest wall Abdominal:     General: Abdomen is flat. Bowel sounds are normal.     Palpations: Abdomen is soft.     Tenderness: There is no abdominal tenderness. There is no guarding.  Musculoskeletal:        General: Normal range of motion.     Cervical back: Normal range of motion and neck supple.     Right lower leg: No edema.     Left lower leg: No edema.  Skin:    General: Skin is warm and dry.  Neurological:     General: No focal deficit present.     Mental Status: He is alert and oriented to person, place, and time. Mental status is at baseline.  Psychiatric:        Mood and Affect: Mood normal.        Behavior: Behavior normal.        Thought Content: Thought content normal.        Judgment: Judgment normal.     (all labs ordered are listed, but only abnormal results are displayed) Labs Reviewed  COMPREHENSIVE METABOLIC PANEL WITH GFR - Abnormal; Notable for the following components:      Result Value   Potassium 3.1 (*)    Glucose, Bld 111 (*)    All other components within normal limits  CBC WITH DIFFERENTIAL/PLATELET - Abnormal; Notable for the following components:   Lymphs Abs 4.3 (*)    All other components within normal limits  D-DIMER, QUANTITATIVE  MAGNESIUM  TROPONIN T, HIGH SENSITIVITY  TROPONIN T, HIGH SENSITIVITY    EKG: EKG Interpretation Date/Time:  Sunday September 05 2024 18:53:37 EST Ventricular Rate:  70 PR Interval:  156 QRS Duration:  108 QT Interval:  391 QTC Calculation: 422 R Axis:   19  Text Interpretation: Sinus rhythm Confirmed by Randall Meadows [phone]) on 09/05/2024 7:47:46 PM  Radiology: [facility] Chest Port 1 View Result Date: 09/05/2024 EXAM: 1 VIEW(S) XRAY OF THE CHEST 09/05/2024 07:33:00 PM COMPARISON: Chest x-ray 08/09/2024. CLINICAL HISTORY: CP FINDINGS: LINES, TUBES AND DEVICES: Catheter overlies the left chest. LUNGS AND PLEURA: No focal pulmonary opacity. No pulmonary edema. No pleural effusion.  No pneumothorax. HEART AND MEDIASTINUM: No acute abnormality of the cardiac and mediastinal silhouettes. BONES AND SOFT TISSUES: No acute osseous abnormality. IMPRESSION: 1. No acute process. Electronically signed by: Greig Pique MD 09/05/2024 07:35 PM EST RP Workstation: [id number]     Procedures   Medications Ordered in the ED  ketorolac (TORADOL) 15 MG/ML injection 15 mg (15 mg Intravenous Given 09/05/24 1922)  morphine  (PF) 4 MG/ML injection 4 mg (4 mg Intravenous Given 09/05/24 1923)  ondansetron (ZOFRAN) injection 4 mg (4 mg Intravenous Given 09/05/24 1923)  sodium chloride 0.9 %  bolus 1,000 mL (0 mLs Intravenous Stopped 09/05/24 2019)  aspirin chewable tablet 324 mg (324 mg Oral Given 09/05/24 1921)  potassium chloride SA (KLOR-CON M) CR tablet 40 mEq (40 mEq Oral Given 09/05/24 2022)                                    Medical Decision Making Amount and/or Complexity of Data Reviewed Labs: ordered. Radiology: ordered.  Risk OTC drugs. Prescription drug management.   This patient presents to the ED for concern of chest pain differential diagnosis includes ACS, pulmonary embolus, pericarditis, myocarditis, endocarditis, aortic aneurysm or dissection, pneumonia, pneumothorax, hemothorax, costochondritis, pleurisy    Additional history obtained:  Additional history obtained from medical records External records from outside source obtained and reviewed including medical records   Lab Tests:  I Ordered, and personally interpreted labs.  The pertinent results include: No leukocytosis, no anemia, mild hypokalemia, normal kidney function liver function, negative D-dimer, negative serial troponins, normal magnesium   Imaging Studies ordered:  I ordered imaging studies including chest x-ray I independently visualized and interpreted imaging which showed no acute cardiopulmonary process I agree with the radiologist interpretation   Medicines ordered and prescription drug  management:  I ordered medication including morphine , Zofran, aspirin, IV fluids, potassium for chest pain, hypokalemia Reevaluation of the patient after these medicines showed that the patient improved I have reviewed the patients home medicines and have made adjustments as needed   Problem List / ED Course:  Patient is doing well at this time and is stable for discharge home.  Discussed with patient that her workup in the emergency department has been unremarkable.  Patient has no indication for ACS at this time.  EKG has no acute ischemic changes and he has negative serial troponins.  Do not suspect pulmonary embolus at this time and is otherwise low risk individual as he has a negative D-dimer.  Low suspicion for aortic aneurysm or dissection and he has no indication for endocarditis.  Symptoms are not positional in nature and do not suspect pericarditis or myocarditis.  Suspect costochondritis at this time his pain is completely reproducible with palpation of the chest wall and do suspect that anxiety is playing a role in his symptoms as well.  Ambulatory referral to cardiology has been placed.  Strict turn precautions were provided for any new or worsening symptoms.  Patient voiced understanding and had no additional questions.   Social Determinants of Health:  None        Final diagnoses:  None    ED Discharge Orders     None          Daralene Lonni JONETTA DEVONNA 09/05/24 [id number]    Randall Sid SAILOR, MD 09/07/24 [phone]

## 2024-09-05 NOTE — Discharge Instructions (Signed)
 Please follow-up closely with your primary care doctor and cardiology on an outpatient basis.  Return to emergency department immediately for any new or worsening symptoms.

## 2024-09-05 NOTE — ED Triage Notes (Signed)
 Left sided Chest pain on and off x 2 weeks worse today radiates to left elbow. Some SOB.

## 2024-09-05 NOTE — Progress Notes (Unsigned)
 Telephone Visit- Progress Note: Referring Physician:  Billy Philippe SAUNDERS, NP [address] [place],  [place] [address]  Primary Physician:  Billy Philippe SAUNDERS, NP  This visit was performed via telephone.  Patient location: home Provider location: office  I spent a total of 15 minutes non-face-to-face activities for this visit on the date of this encounter including review of current clinical condition and response to treatment.    Patient has given verbal consent to this telephone visits and we reviewed the limitations of a telephone visit. Patient wishes to proceed.    Chief Complaint:  review imaging  History of Present Illness: [name] has a history of HTN, asthma, obesity, anxiety.  Last seen by me on 08/09/24 for back and left leg pain. Lumbar MRI was ordered (after getting shunt series to evaluate his shunt).   PCP has referred him to neurology for memory issues.   Phone visit scheduled to review his lumbar MRI.   He is doing a little better since his last visit. He has intermittent LBP with occasional left posterior leg pain. He has seen improvement in bilateral leg pain from knees to his feet. He still has weakness, numbness, and tingling in right leg, left leg weakness has improved. Pain is still worse with prolonged sitting/standing/driving.   He has seen improvement with OTC back aid medication. He is still on gabapentin .   Tobacco use: Does not smoke.   Bowel/Bladder Dysfunction: none  Conservative measures:  Physical therapy: initial evaluation on 10/04/22 with 7 visits and he was discharged on 11/20/22 Multimodal medical therapy including regular antiinflammatories:  Hydrocodone, Oxycodone , Gabapentin , Ibuprofen, Tramadol, Flexeril Injections:  no epidural steroid injections  Past Surgery: no spine surgery  Randall Meadows has no symptoms of cervical myelopathy.  The symptoms are causing a significant impact on the patient's life.      Exam: No exam done as this was a telephone encounter.     Medical Decision Making  Imaging: Lumbar MRI dated 08/28/24:  FINDINGS: Segmentation: Standard. Lowest well-formed disc space labeled the L5-S1 level.   Alignment: Physiologic with preservation of the normal lumbar lordosis. No listhesis.   Vertebrae: Vertebral body height maintained without acute or chronic fracture. Decreased T1 signal intensity noted throughout the visualized bone marrow, nonspecific, but most commonly related to anemia, smoking, or obesity. No worrisome osseous lesions. Mild reactive marrow edema present about the left L4-5 facet due to facet arthritis. No other abnormal marrow edema.   Conus medullaris and cauda equina: Conus extends to the T12-L1 level. Conus and cauda equina appear normal.   Paraspinal and other soft tissues: Unremarkable.   Disc levels:   L1-2: Normal interspace. Mild bilateral facet hypertrophy. No stenosis.   L2-3: Normal interspace. Mild bilateral facet hypertrophy. No stenosis.   L3-4: Disc desiccation with mild disc bulge. Superimposed left foraminal disc protrusion with annular fissure contacts the left L3 nerve root (series 8, image 24). Mild bilateral facet hypertrophy. No significant spinal stenosis. Mild bilateral L3 foraminal narrowing.   L4-5: Disc desiccation with mild disc bulge. Superimposed small right foraminal disc protrusion with slight superior migration (series 5, image 5). Mild to moderate bilateral facet hypertrophy. No significant spinal stenosis. Moderate bilateral L4 foraminal narrowing.   L5-S1: Disc desiccation with mild disc bulge. Right foraminal annular fissure noted. Mild left greater than right facet hypertrophy. No spinal stenosis. Mild bilateral L5 foraminal narrowing.   IMPRESSION: 1. Small right foraminal disc protrusion at L4-5, potentially affecting the right  L4 nerve root. 2. Left foraminal disc protrusion at L3-4,  contacting and potentially affecting the exiting left L3 nerve root. 3. Mild to moderate bilateral L3 through L5 foraminal stenosis related disc bulge and facet hypertrophy. 4. Mild reactive marrow edema about the left L4-5 facet due to facet arthritis. Finding could serve as a source for lower back pain.     Electronically Signed   By: Morene Hoard M.D.   On: 08/30/2024 05:27      I have personally reviewed the images and agree with the above interpretation.  Assessment and Plan: Mr. Bignell He is doing a little better since his last visit. He has intermittent LBP with occasional left posterior leg pain. He has seen improvement in bilateral leg pain from knees to his feet. He still has weakness, numbness, and tingling in right leg, left leg weakness has improved. Pain is still worse with prolonged sitting/standing/driving.   He has known lumbar spondylosis and DDD with mild bilateral foraminal stenosis L3-L4 and L5-S1 along with moderate bilateral foraminal stenosis L4-L5.   Discussed MRI results with patient and his wife.   Treatment options discussed with patient and following plan made:   - PT for lumbar spine. Orders to [facility].  - Referral to PMR at [facility] to discuss possible injections.  - Follow up with me in 6-8 weeks to check on his progress with above.   Glade Boys PA-C Dept. of Neurosurgery

## 2024-09-06 ENCOUNTER — Ambulatory Visit (INDEPENDENT_AMBULATORY_CARE_PROVIDER_SITE_OTHER)

## 2024-09-06 VITALS — Ht 69.0 in | Wt 241.0 lb

## 2024-09-06 DIAGNOSIS — Z0000 Encounter for general adult medical examination without abnormal findings: Secondary | ICD-10-CM | POA: Diagnosis not present

## 2024-09-06 NOTE — Progress Notes (Signed)
 Subjective:   Randall Meadows is a 48 y.o. male who presents for a Medicare Annual Wellness Visit.  Allergies (verified) Grass pollen([id number] vern)   History: Past Medical History:  Diagnosis Date   Anxiety    Asthma    Congenital hydrocephaly (HCC)    Depression    Hypertension    Memory impairment    Sciatica    Past Surgical History:  Procedure Laterality Date   BRAIN SURGERY     Shunt from Hydrocephaly   TONSILLECTOMY     Family History  Problem Relation Age of Onset   Hypertension Mother    Heart disease Mother    Diabetes Mother    Depression Son    Anxiety disorder Son    Allergic rhinitis Son    Depression Son    Anxiety disorder Son    Allergic rhinitis Son    Bipolar disorder Maternal Aunt    Schizophrenia Maternal Aunt    Urticaria Maternal Grandmother    Food Allergy Maternal Grandmother    Heart disease Maternal Grandmother    Hypertension Maternal Grandfather    Social History   Occupational History   Occupation: disabled  Tobacco Use   Smoking status: Never   Smokeless tobacco: Never  Vaping Use   Vaping status: Never Used  Substance and Sexual Activity   Alcohol use: Never   Drug use: Never   Sexual activity: Yes   Tobacco Counseling Counseling given: Not Answered  SDOH Screenings   Food Insecurity: Food Insecurity Present (09/03/2024)  Housing: Low Risk  (09/03/2024)  Transportation Needs: No Transportation Needs (09/03/2024)  Utilities: Not At Risk (09/06/2024)  Alcohol Screen: Low Risk  (08/30/2024)  Depression (PHQ2-9): High Risk (09/06/2024)  Financial Resource Strain: Low Risk  (09/03/2024)  Physical Activity: Insufficiently Active (09/03/2024)  Social Connections: Moderately Integrated (09/03/2024)  Stress: Stress Concern Present (09/03/2024)  Tobacco Use: Low Risk  (09/06/2024)  Health Literacy: Adequate Health Literacy (09/06/2024)   Depression Screen    09/06/2024    1:25 PM 08/30/2024    9:37 AM 08/30/2024     9:32 AM  PHQ 2/9 Scores  PHQ - 2 Score 6 2 2   PHQ- 9 Score 15 14  14       Data saved with a previous flowsheet row definition     Goals Addressed             This Visit's Progress    Patient Stated       Continue to lose weight/180 lb goal/2025       Visit info / Clinical Intake: Medicare Wellness Visit Type:: Subsequent Annual Wellness Visit Persons participating in visit:: patient Medicare Wellness Visit Mode:: Video Because this visit was a virtual/telehealth visit:: pt reported vitals If Telephone or Video please confirm:: The patient expressed understanding and agreed to proceed; I discussed the limitations of evaluation and management by telemedicine; I connected with the patient using audio enabled telemedicine application and verified that I am speaking with the correct person using two identifiers Patient Location:: Home Provider Location:: Home Information given by:: patient Interpreter Needed?: No Pre-visit prep was completed: yes AWV questionnaire completed by patient prior to visit?: yes Date:: 09/03/24 Living arrangements:: lives with spouse/significant other Patient's Overall Health Status Rating: (!) fair Typical amount of pain: some (slight headache) Does pain affect daily life?: no Are you currently prescribed opioids?: no  Dietary Habits and Nutritional Risks How many meals a day?: (!) 1 (sometimes 2) Eats fruit and vegetables daily?: yes  Most meals are obtained by: preparing own meals In the last 2 weeks, have you had any of the following?: (!) nausea, vomiting, diarrhea (diarrhea/starting today) Diabetic:: no  Functional Status Activities of Daily Living (to include ambulation/medication): Independent Ambulation: Independent with device- listed below Home Assistive Devices/Equipment: Cane; Eyeglasses (sometime usued the cane) Medication Administration: Independent Home Management: Independent Manage your own finances?: yes (wife helps) Primary  transportation is: driving; family/friends (wife drives mainly) Concerns about vision?: no *vision screening is required for [facility]* (wears eyeglasses/[employer]/npt UTD) Concerns about hearing?: no  Fall Screening Falls in the past year?: 1 Number of falls in past year: 1 Was there an injury with Fall?: 0 (fractured both legs) Fall Risk Category Calculator: 2 Patient Fall Risk Level: Moderate Fall Risk  Fall Risk Patient at Risk for Falls Due to: Impaired balance/gait Fall risk Follow up: Falls evaluation completed; Falls prevention discussed  Home and Transportation Safety: All rugs have non-skid backing?: N/A, no rugs All stairs or steps have railings?: (!) no Grab bars in the bathtub or shower?: (!) no Have non-skid surface in bathtub or shower?: (!) no Good home lighting?: yes Regular seat belt use?: yes Hospital stays in the last year:: no  Cognitive Assessment Difficulty concentrating, remembering, or making decisions? : yes Will 6CIT or Mini Cog be Completed: yes Was the patient able to repeat memory words in 3 tries?: yes Which version was used?: Version 5 : captain, garden, picture Clock numbers correct?: yes Clock time correct (11:10)?: yes Normal clock drawing test?: 2 How many words correct?: 3 Mini-Cog Scoring: 5 What year is it?: 0 points What month is it?: 0 points Give patient an address phrase to remember (5 components): [address], [place] About what time is it?: 0 points Count backwards from 20 to 1: 0 points Say the months of the year in reverse: 0 points Repeat the address phrase from earlier: 2 points 6 CIT Score: 2 points  Advance Directives (For Healthcare) Does Patient Have a Medical Advance Directive?: No Would patient like information on creating a medical advance directive?: No - Patient declined  Reviewed/Updated  Reviewed/Updated: Reviewed All (Medical, Surgical, Family, Medications, Allergies, Care Teams, Patient Goals)         Objective:    Today's Vitals   09/06/24 1304  Weight: 241 lb (109.3 kg)  Height: 5' 9 (1.753 m)   Body mass index is 35.59 kg/m.  Current Medications (verified) Outpatient Encounter Medications as of 09/06/2024  Medication Sig   albuterol  (VENTOLIN  HFA) 108 (90 Base) MCG/ACT inhaler Inhale 1-2 puffs into the lungs every 4 (four) hours as needed for wheezing or shortness of breath (cough, shortness of breath or wheezing.).   benzonatate  (TESSALON ) 100 MG capsule Take 1 capsule (100 mg total) by mouth every 8 (eight) hours.   budesonide -formoterol  (SYMBICORT ) 160-4.5 MCG/ACT inhaler Inhale 2 puffs into the lungs daily.   cetirizine  (ZYRTEC ) 10 MG tablet Take 1 tablet (10 mg total) by mouth daily as needed for allergies.   Cholecalciferol (VITAMIN D3) 1.25 MG (50000 UT) CAPS Take 1 capsule (1.25 mg total) by mouth every 7 (seven) days.   donepezil (ARICEPT) 5 MG tablet Take 5 mg by mouth daily.   fluticasone  (FLONASE ) 50 MCG/ACT nasal spray Place 2 sprays into both nostrils daily as needed.   gabapentin  (NEURONTIN ) 100 MG capsule TAKE 1 CAPSULE BY MOUTH THREE TIMES DAILY   hydrOXYzine  (ATARAX ) 25 MG tablet Take 1 tablet (25 mg total) by mouth at bedtime.  lisinopril-hydrochlorothiazide (ZESTORETIC) 20-25 MG tablet Take 1 tablet by mouth daily.   metoprolol  tartrate (LOPRESSOR ) 25 MG tablet TAKE 1/2 TABLET BY MOUTH TWICE DAILY   naproxen (NAPROSYN) 500 MG tablet Take 1 tablet (500 mg total) by mouth 2 (two) times daily.   triamcinolone  cream (KENALOG ) 0.1 % Apply a thin layer to the affected areas twice daily. DO NOT use on the face.   No facility-administered encounter medications on file as of 09/06/2024.   Hearing/Vision screen Hearing Screening - Comments:: Denies hearing difficulties   Vision Screening - Comments:: Wears eyeglasses/ [employer]/Not UTD Immunizations and Health Maintenance Health Maintenance  Topic Date Due   DTaP/Tdap/Td (1 - Tdap) Never done    Pneumococcal Vaccine: 50+ Years (1 of 2 - PCV) Never done   Hepatitis B Vaccines 19-59 Average Risk (1 of 3 - 19+ 3-dose series) Never done   Colonoscopy  Never done   Zoster Vaccines- Shingrix (1 of 2) Never done   Influenza Vaccine  Never done   COVID-19 Vaccine (3 - 2025-26 season) 06/28/2024   Medicare Annual Wellness (AWV)  09/06/2025   Hepatitis C Screening  Completed   HIV Screening  Completed   HPV VACCINES  Aged Out   Meningococcal B Vaccine  Aged Out        Assessment/Plan:  This is a routine wellness examination for Kalai.  Patient Care Team: Billy Philippe SAUNDERS, NP as PCP - General (Family Medicine) Lenor Rogue, PA-C (Rheumatology) Margrette Taft BRAVO, MD as Consulting Physician (Orthopedic Surgery) Hilma Hastings, PA-C as Physician Assistant (Neurosurgery) Iva Marty Saltness, MD as Consulting Physician (Allergy and Immunology) Lonni Slain, MD as Consulting Physician (Cardiology)  I have personally reviewed and noted the following in the patient's chart:   Medical and social history Use of alcohol, tobacco or illicit drugs  Current medications and supplements including opioid prescriptions. Functional ability and status Nutritional status Physical activity Advanced directives List of other physicians Hospitalizations, surgeries, and ER visits in previous 12 months Vitals Screenings to include cognitive, depression, and falls Referrals and appointments  No orders of the defined types were placed in this encounter.  In addition, I have reviewed and discussed with patient certain preventive protocols, quality metrics, and best practice recommendations. A written personalized care plan for preventive services as well as general preventive health recommendations were provided to patient.   Jermey Closs L Vivan Vanderveer, CMA   09/06/2024   Return in 1 year (on 09/06/2025).  After Visit Summary: (MyChart) Due to this being a telephonic visit, the after visit  summary with patients personalized plan was offered to patient via MyChart   Nurse Notes: Patient is due for all listed vaccines, in health maintenance.  Patient stated that he did receive a flu vaccine in Sept this year, however it is not documented in [facility].  Patient had no other concerns to address today.

## 2024-09-06 NOTE — Patient Instructions (Addendum)
 [name],  Thank you for taking the time for your Medicare Wellness Visit. I appreciate your continued commitment to your health goals. Please review the care plan we discussed, and feel free to reach out if I can assist you further.  Please note that Annual Wellness Visits do not include a physical exam. Some assessments may be limited, especially if the visit was conducted virtually. If needed, we may recommend an in-person follow-up with your provider.  Ongoing Care Seeing your primary care provider every 3 to 6 months helps us  monitor your health and provide consistent, personalized care. Next office visit on 09/13/2024.  You are due for a pneumonia vaccine, a shingles vaccine, a tetanus vaccine and a Hep B vaccine.  These vaccines can be given at your local pharmacy.  Aim for 30 minutes of exercise or brisk walking, 6-8 glasses of water, and 5 servings of fruits and vegetables each day.  Keep up the good work.  Referrals If a referral was made during today's visit and you haven't received any updates within two weeks, please contact the referred provider directly to check on the status.  Recommended Screenings:  Health Maintenance  Topic Date Due   Medicare Annual Wellness Visit  Never done   DTaP/Tdap/Td vaccine (1 - Tdap) Never done   Pneumococcal Vaccine for age over 30 (1 of 2 - PCV) Never done   Hepatitis B Vaccine (1 of 3 - 19+ 3-dose series) Never done   Colon Cancer Screening  Never done   Zoster (Shingles) Vaccine (1 of 2) Never done   Flu Shot  Never done   COVID-19 Vaccine (3 - 2025-26 season) 06/28/2024   Hepatitis C Screening  Completed   HIV Screening  Completed   HPV Vaccine  Aged Out   Meningitis B Vaccine  Aged Out       09/06/2024    1:12 PM  Advanced Directives  Does Patient Have a Medical Advance Directive? No    Vision: Annual vision screenings are recommended for early detection of glaucoma, cataracts, and diabetic retinopathy. These exams can also  reveal signs of chronic conditions such as diabetes and high blood pressure.  Dental: Annual dental screenings help detect early signs of oral cancer, gum disease, and other conditions linked to overall health, including heart disease and diabetes.  Please see the attached documents for additional preventive care recommendations.

## 2024-09-09 ENCOUNTER — Ambulatory Visit (INDEPENDENT_AMBULATORY_CARE_PROVIDER_SITE_OTHER): Admitting: Orthopedic Surgery

## 2024-09-09 ENCOUNTER — Encounter: Payer: Self-pay | Admitting: Orthopedic Surgery

## 2024-09-09 DIAGNOSIS — M47816 Spondylosis without myelopathy or radiculopathy, lumbar region: Secondary | ICD-10-CM

## 2024-09-09 DIAGNOSIS — M51362 Other intervertebral disc degeneration, lumbar region with discogenic back pain and lower extremity pain: Secondary | ICD-10-CM

## 2024-09-09 DIAGNOSIS — M5416 Radiculopathy, lumbar region: Secondary | ICD-10-CM

## 2024-09-10 NOTE — Telephone Encounter (Signed)
 Patient seen in ED 11/9, below from visit   Patient with 2 weeks of intermittent chest pain. Does have history of HTN, obesity, asthma. He denies any personal history of CAD but does have a family history. He was given aspirin, morphine , Toradol, Zofran. Chart review shows a workup in 2022 with Dr. Lonni that showed no evidence of CAD, coronary calcium score of 0. Echocardiogram with grade 1 diastolic dysfunction, otherwise within normal limits. Patient is very well-appearing. No associated nausea vomiting diaphoresis or shortness of breath. Chest x-ray normal. EKG without any signs of ischemia or arrhythmia. Pain seems to be reproducible with palpation and I have higher suspicion for chest wall pain. He has a negative D-dimer and normal labs. Normal troponin. Low heart score. Patient can follow-up outpatient with cardiologist and is instructed to take Tylenol  ibuprofen for pain. DC w/ discharge instructions/return precautions. All questions answered to patient's satisfaction.

## 2024-09-13 ENCOUNTER — Encounter (HOSPITAL_BASED_OUTPATIENT_CLINIC_OR_DEPARTMENT_OTHER): Payer: Self-pay | Admitting: Nurse Practitioner

## 2024-09-13 ENCOUNTER — Ambulatory Visit (INDEPENDENT_AMBULATORY_CARE_PROVIDER_SITE_OTHER): Admitting: Nurse Practitioner

## 2024-09-13 ENCOUNTER — Ambulatory Visit: Admitting: Family Medicine

## 2024-09-13 VITALS — BP 124/68 | HR 64 | Ht 69.0 in | Wt 248.7 lb

## 2024-09-13 DIAGNOSIS — E785 Hyperlipidemia, unspecified: Secondary | ICD-10-CM | POA: Diagnosis not present

## 2024-09-13 DIAGNOSIS — F419 Anxiety disorder, unspecified: Secondary | ICD-10-CM

## 2024-09-13 DIAGNOSIS — Z7189 Other specified counseling: Secondary | ICD-10-CM

## 2024-09-13 DIAGNOSIS — I10 Essential (primary) hypertension: Secondary | ICD-10-CM

## 2024-09-13 DIAGNOSIS — R413 Other amnesia: Secondary | ICD-10-CM | POA: Diagnosis not present

## 2024-09-13 DIAGNOSIS — R079 Chest pain, unspecified: Secondary | ICD-10-CM | POA: Diagnosis not present

## 2024-09-13 DIAGNOSIS — E876 Hypokalemia: Secondary | ICD-10-CM

## 2024-09-13 MED ORDER — ROSUVASTATIN CALCIUM 5 MG PO TABS: 5.0000 mg | ORAL_TABLET | Freq: Every day | ORAL | 3 refills | Status: AC

## 2024-09-13 MED ORDER — ESCITALOPRAM OXALATE 10 MG PO TABS: 10.0000 mg | ORAL_TABLET | Freq: Every day | ORAL | 11 refills | Status: DC

## 2024-09-13 MED ORDER — METOPROLOL SUCCINATE ER 25 MG PO TB24: 25.0000 mg | ORAL_TABLET | Freq: Every day | ORAL | 3 refills | Status: AC

## 2024-09-13 NOTE — Progress Notes (Signed)
 " Cardiology Office Note   Date:  09/13/2024  ID:  Randall Meadows, DOB 1976/07/20, MRN [id number] PCP: Billy Philippe SAUNDERS, NP  [facility] Providers Cardiologist:  None     PMH Chest pain Coronary calcium  score of 0, no CAD on CCTA 12/2020 Obesity Hypertension Family history of heart disease  Referred to cardiology and seen by Dr. Lonni 08/2020 for evaluation of chest pain. Coronary CTA 01/15/2021 with CAC Score of 0, no evidence of CAD. Echo 04/25/2021 with normal LVEF 65-70%, G1DD, normal RV, no significant valve disease.  Last clinic visit was 10/08/2022 with Dr. Lonni.  He reported constant chest pain/tightness and shortness of breath.  Pain sometimes improved with as needed Flexeril and nitroglycerin  and improved after starting metoprolol .  He was feeling terrible.  Was taking muscle relaxer which is only sometimes effective and Symbicort  helped to relieve tightness.  Was using a cane due to difficulty keeping his balance and pain with walking. Advised of low risk of cardiac etiology for chest pain given no CAD, normal echo.   ED visit 09/05/24 with chest pain.  EKG revealed sinus rhythm, no evidence of ischemia or arrhythmia.  Pain was reproducible with palpation.  CXR was normal.  Normal troponin x 2, negative D-dimer.  History of Present Illness Discussed the use of AI scribe software for clinical note transcription with the patient, who gave verbal consent to proceed.  History of Present Illness Randall Meadows is a very pleasant 48 year old male who presents with persistent chest pain. He is accompanied by his wife, Tawni. He experiences persistent chest pain lasting for hours or all day, located in the left chest. Pain is sometimes worsened by lying down or sitting up and occurs without specific activity. He takes metoprolol , half of a 25 mg tablet twice a day, and has not been prescribed nitroglycerin .  He previously experienced relief with Flexeril but  does not have a current prescription.  He is under significant stress due to family issues, including caring for his grandmother with dementia and Alzheimer's, which has been ongoing for years. He does not get much help from other family members aside from his wife. He experiences anxiety and takes hydroxyzine  at bedtime. History of high cholesterol and was previously on rosuvastatin  without issues.  He admits he is very anxious and feels overwhelmed with caregiving responsibilities.  He denies dyspnea, orthopnea, PND, edema, presyncope, syncope.  We discussed prior cardiac testing in detail with reassurance provided.   ROS: + Chest pain  Studies Reviewed EKG Interpretation Date/Time:  Monday September 13 2024 14:05:44 EST Ventricular Rate:  64 PR Interval:  172 QRS Duration:  100 QT Interval:  402 QTC Calculation: 414 R Axis:   9  Text Interpretation: Normal sinus rhythm with sinus arrhythmia Normal ECG When compared with ECG of 05-Sep-2024 18:53, PREVIOUS ECG IS PRESENT Confirmed by Percy Browning [phone]) on 09/13/2024 2:13:11 PM     No results found for: LIPOA  Risk Assessment/Calculations           Physical Exam VS:  BP 124/68 (BP Location: Right Arm, Patient Position: Sitting, Cuff Size: Large)   Pulse 64   Ht 5' 9 (1.753 m)   Wt 248 lb 11.2 oz (112.8 kg)   SpO2 97%   BMI 36.73 kg/m    Wt Readings from Last 3 Encounters:  09/13/24 248 lb 11.2 oz (112.8 kg)  09/06/24 241 lb (109.3 kg)  09/05/24 241 lb 12.8 oz (109.7 kg)  GEN: Well nourished, well developed in no acute distress NECK: No JVD; No carotid bruits CARDIAC:                                                                                                                                                              RRR, no murmurs, rubs, gallops RESPIRATORY:  Clear to auscultation without rales, wheezing or rhonchi  ABDOMEN: Soft, non-tender, non-distended EXTREMITIES:  No edema; No deformity     Assessment & Plan Chest pain  Anxiety Cardiac risk History of chronic chest pain that occurs frequently, sometimes lasting all day, sometimes for shorter duration.  Pain is not associated with increased shortness of breath, diaphoresis, or n/v.  Pain sometimes worsens with position changes but he cannot pinpoint a specific activity or movement that makes it worse.  Pain has been improved in the past with Flexeril, but he no longer has this medication.  He notes some improvement with metoprolol  but he recently ran out.  He is having trouble splitting the metoprolol  25 mg tablets.  Reviewed that coronary CTA revealed no CAD and he had a normal echo in 2022. EKG today reveals NSR with sinus arrhythmia, no acute concerns. Recent testing in ED revealed no concern for ACS. He asks about nitroglycerin  for pain management, but deferred given that it may cause headache.  He agrees that he would likely benefit from management of anxiety.  He is seeing a new PCP in a few weeks. - We will change metoprolol  to Toprol  XL 25 mg once  - Start Lexapro  10 mg daily for anxiety - Consider repeat echo if symptoms persist - Focus on heart healthy mostly plant based diet avoiding saturated fat, processed foods, simple carbohydrates, and sugar along with aiming for at least 150 minutes of moderate intensity exercise each week   Hyperlipidemia LDL goal < 100 Lipid panel completed 08/30/2024 with total cholesterol 214, triglycerides 68, HDL 72, and LDL-C 128.  We discussed how higher cholesterol can impact risk for ASCVD.  He is agreeable to start statin therapy.  Advised that we will stagger medications start times to evaluate for potential side effects. -Start rosuvastatin  5 mg daily 2 weeks after starting Lexapro   Memory impairment   He reports Aricept was started due to difficulty remembering simple things.  His wife states he would remember something she had just told him a few seconds prior.   - Management per PCP    Hypertension Hypokalemia BP is well controlled.  Metabolic panel 09/05/2024 revealed hypokalemia with K of 3.1.  He is on potassium supplementation.  Kidney function is stable. We are switching from metoprolol  tartrate to succinate to aid compliance.  - Continue lisinopril/hydrochlorothiazide, metoprolol         Dispo: 3 months with Dr. Lonni or APP  Signed, Rosaline Bane, NP-C "

## 2024-09-13 NOTE — Patient Instructions (Signed)
 Medication Instructions:   START Lexapro one (1) tablet by mouth ( 10 mg) daily.  START Toprol  XL one (1) tablet by mouth ( 25 mg) daily.   START Rosuvastatin one (1) tablet by mouth ( 5 mg) daily. You can start in 2 weeks.  *If you need a refill on your cardiac medications before your next appointment, please call your pharmacy*  Lab Work:  None ordered.  If you have labs (blood work) drawn today and your tests are completely normal, you will receive your results only by: MyChart Message (if you have MyChart) OR A paper copy in the mail If you have any lab test that is abnormal or we need to change your treatment, we will call you to review the results.  Testing/Procedures:  None ordered.  Follow-Up: At [facility], you and your health needs are our priority.  As part of our continuing mission to provide you with exceptional heart care, our providers are all part of one team.  This team includes your primary Cardiologist (physician) and Advanced Practice Providers or APPs (Physician Assistants and Nurse Practitioners) who all work together to provide you with the care you need, when you need it.  Your next appointment:   3 month(s)  Provider:   Rosaline Bane, NP    We recommend signing up for the patient portal called MyChart.  Sign up information is provided on this After Visit Summary.  MyChart is used to connect with patients for Virtual Visits (Telemedicine).  Patients are able to view lab/test results, encounter notes, upcoming appointments, etc.  Non-urgent messages can be sent to your provider as well.   To learn more about what you can do with MyChart, go to [email or web address].   Other Instructions  Adopting a Healthy Lifestyle.   Weight: Know what a healthy weight is for you (roughly BMI <25) and aim to maintain this. You can calculate your body mass index on your smart phone. Unfortunately, this is not the most accurate measure of healthy weight,  but it is the simplest measurement to use. A more accurate measurement involves body scanning which measures lean muscle, fat tissue and bony density. We do not have this equipment at [facility].    Diet: Aim for 7+ servings of fruits and vegetables daily Limit animal fats in diet for cholesterol and heart health - choose grass fed whenever available Avoid highly processed foods (fast food burgers, tacos, fried chicken, pizza, hot dogs, french fries)  Saturated fat comes in the form of butter, lard, coconut oil, margarine, partially hydrogenated oils, dairy products, and fat in meat. These increase your risk of cardiovascular disease.  Use healthy plant oils, such as olive, canola, soy, corn, sunflower and peanut.  Whole foods such as fruits, vegetables and whole grains have fiber  Men need > 38 grams of fiber per day Women need > 25 grams of fiber per day  Load up on vegetables and fruits - one-half of your plate: Aim for color and variety, and remember that potatoes dont count. Go for whole grains - one-quarter of your plate: Whole wheat, barley, wheat berries, quinoa, oats, brown rice, and foods made with them. If you want pasta, go with whole wheat pasta. Protein power - one-quarter of your plate: Fish, chicken, beans, and nuts are all healthy, versatile protein sources. Limit red meat. You need carbohydrates for energy! The type of carbohydrate is more important than the amount. Choose carbohydrates such as vegetables, fruits, whole grains, beans, and nuts in  the place of white rice, white pasta, potatoes (baked or fried), macaroni and cheese, cakes, cookies, and donuts.  If youre thirsty, drink water. Coffee and tea are good in moderation, but skip sugary drinks and limit milk and dairy products to one or two daily servings. Keep sugar intake at 6 teaspoons or 24 grams or LESS       Exercise: Aim for 150 min of moderate intensity exercise weekly for heart health, and weights twice weekly  for bone health Stay active - any steps are better than no steps! Aim for 7-9 hours of sleep daily   Sleep: This provides your body with the reset and relaxation that it needs!  Aim to get 7-8 hours of sleep each night. Limit caffeine, screen time, and other distractions prior to bedtime.  Keep your bedroom cool and dark and do not wear heavy clothing to bed or use heavy bed covers - layer if needed.

## 2024-09-14 ENCOUNTER — Encounter (HOSPITAL_BASED_OUTPATIENT_CLINIC_OR_DEPARTMENT_OTHER): Payer: Self-pay | Admitting: Nurse Practitioner

## 2024-09-17 ENCOUNTER — Ambulatory Visit (HOSPITAL_BASED_OUTPATIENT_CLINIC_OR_DEPARTMENT_OTHER): Admitting: Cardiology

## 2024-09-18 LAB — COLOGUARD: COLOGUARD: POSITIVE — AB

## 2024-09-20 ENCOUNTER — Other Ambulatory Visit: Payer: Self-pay | Admitting: Orthopedic Surgery

## 2024-09-20 DIAGNOSIS — M5410 Radiculopathy, site unspecified: Secondary | ICD-10-CM

## 2024-09-20 DIAGNOSIS — M545 Low back pain, unspecified: Secondary | ICD-10-CM

## 2024-09-20 NOTE — Addendum Note (Signed)
 Addended by: ELNER NANNY B on: 09/20/2024 11:48 AM   Modules accepted: Orders

## 2024-09-27 ENCOUNTER — Ambulatory Visit: Admitting: Family Medicine

## 2024-09-27 ENCOUNTER — Encounter: Payer: Self-pay | Admitting: Family Medicine

## 2024-09-27 VITALS — BP 124/82 | HR 51 | Temp 97.8°F | Ht 69.0 in | Wt 251.0 lb

## 2024-09-27 DIAGNOSIS — I10 Essential (primary) hypertension: Secondary | ICD-10-CM

## 2024-09-27 DIAGNOSIS — R7989 Other specified abnormal findings of blood chemistry: Secondary | ICD-10-CM

## 2024-09-27 DIAGNOSIS — F322 Major depressive disorder, single episode, severe without psychotic features: Secondary | ICD-10-CM | POA: Insufficient documentation

## 2024-09-27 DIAGNOSIS — E559 Vitamin D deficiency, unspecified: Secondary | ICD-10-CM | POA: Insufficient documentation

## 2024-09-27 DIAGNOSIS — F411 Generalized anxiety disorder: Secondary | ICD-10-CM

## 2024-09-27 DIAGNOSIS — R7303 Prediabetes: Secondary | ICD-10-CM

## 2024-09-27 DIAGNOSIS — E782 Mixed hyperlipidemia: Secondary | ICD-10-CM

## 2024-09-27 DIAGNOSIS — E876 Hypokalemia: Secondary | ICD-10-CM

## 2024-09-27 LAB — COMPREHENSIVE METABOLIC PANEL WITH GFR
ALT: 23 U/L (ref 0–53)
AST: 24 U/L (ref 0–37)
Albumin: 4.3 g/dL (ref 3.5–5.2)
Alkaline Phosphatase: 60 U/L (ref 39–117)
BUN: 14 mg/dL (ref 6–23)
CO2: 33 meq/L — ABNORMAL HIGH (ref 19–32)
Calcium: 9.5 mg/dL (ref 8.4–10.5)
Chloride: 101 meq/L (ref 96–112)
Creatinine, Ser: 1.1 mg/dL (ref 0.40–1.50)
GFR: 78.26 mL/min (ref >60.00)
Glucose, Bld: 86 mg/dL (ref 70–99)
Potassium: 3.4 meq/L — ABNORMAL LOW (ref 3.5–5.1)
Sodium: 141 meq/L (ref 135–145)
Total Bilirubin: 0.7 mg/dL (ref 0.2–1.2)
Total Protein: 7.4 g/dL (ref 6.0–8.3)

## 2024-09-27 LAB — TSH: TSH: 0.55 u[IU]/mL (ref 0.35–5.50)

## 2024-09-27 LAB — T4, FREE: Free T4: 0.73 ng/dL (ref 0.60–1.60)

## 2024-09-27 LAB — T3, FREE: T3, Free: 3 pg/mL (ref 2.3–4.2)

## 2024-09-27 MED ORDER — ESCITALOPRAM OXALATE 10 MG PO TABS: 10.0000 mg | ORAL_TABLET | Freq: Every day | ORAL | 0 refills | Status: AC

## 2024-09-27 NOTE — Progress Notes (Signed)
 Established Patient Office Visit   Subjective:  Patient ID: Randall Meadows, male    DOB: Jan 08, 1976  Age: 48 y.o. MRN: [id number]  Chief Complaint  Patient presents with   Medical Management of Chronic Issues    2 week follow up     HPI HTN: Chronic. Patient is taking Lisinopril-HCTZ 20-25mg  daily. On previous appointment he reported his blood pressures have been elevated at home. However, his blood pressures have been stable during appointments. Blood pressure ranging 112-137/60-79. BP Readings from Last 3 Encounters:  09/27/24 124/82  09/13/24 124/68  09/05/24 117/69   Since initial appointment, patient was seen at [facility] at [facility] on 09/05/2024 for chest pain. EKG revealed sinus rhythm, no evidence of ischemia or arrhythmia. Pain was reproducible with palpation. CXR was normal. Normal troponin levels x 2, negative D-dimer. He was to follow in with cardiology, which he has. He seen Rosaline Bane, NP with [facility] at [facility] on 11/17. His Metoprolol  was changed to Toprol  XL 25mg  once a day. As for anxiety, cardiology started him on Lexapro  10mg  with following up with primary care. Patient reports he is much calmer, I don't get too excited, and depression is improving from starting Lexapro . He is also taking Hydroxyzine  25 daily at bedtime that is prescribed by Dr. Marty Shaggy, more for allergies. He reports this is not as effective and will be contacting provider. Hydroxyzine  is also used to help manage anxiety, which may benefit him.  Also, he agreed to start Rosuvastatin  for hyperlipidemia about a week later from starting Lexapro . He has started this medication as well.   When he went to the ED his potassium was 3.1, will need to recheck today.   On initial visit with primary care, his TSH was low with no follow up Free T3 or Free T4. His vitamin D  was low, which he has started weekly vitamin D  supplement,  50,000IU tablet. Also, taking OTC vitamin B12 supplement for low normal levels. On last appointment, he was referred to neurology for memory impairment and headaches. He has an appointment set up in March 2026 with [facility].  Last, his A1c was prediabetes. Since knowing his results, he has started to work more on lifestyle changes.  ROS See HPI above     Objective:   BP 124/82   Pulse (!) 51   Temp 97.8 F (36.6 C) (Oral)   Ht 5' 9 (1.753 m)   Wt 251 lb (113.9 kg)   SpO2 98%   BMI 37.07 kg/m  Wt Readings from Last 3 Encounters:  09/27/24 251 lb (113.9 kg)  09/13/24 248 lb 11.2 oz (112.8 kg)  09/06/24 241 lb (109.3 kg)    Physical Exam Vitals reviewed.  Constitutional:      General: He is not in acute distress.    Appearance: Normal appearance. He is obese. He is not ill-appearing, toxic-appearing or diaphoretic.  Eyes:     General:        Right eye: No discharge.        Left eye: No discharge.     Conjunctiva/sclera: Conjunctivae normal.  Cardiovascular:     Rate and Rhythm: Regular rhythm. Bradycardia present.     Heart sounds: Normal heart sounds. No murmur heard.    No friction rub. No gallop.  Pulmonary:     Effort: Pulmonary effort is normal. No respiratory distress.     Breath sounds: Normal breath sounds.  Musculoskeletal:  General: Normal range of motion.  Skin:    General: Skin is warm and dry.  Neurological:     General: No focal deficit present.     Mental Status: He is alert and oriented to person, place, and time. Mental status is at baseline.  Psychiatric:        Mood and Affect: Mood normal.        Behavior: Behavior normal.        Thought Content: Thought content normal.        Judgment: Judgment normal.      Assessment & Plan:  Essential hypertension Assessment & Plan: Blood pressures are stable in office and at home. Continue Lisinopril-HCTZ 20-25mg  daily and Toprol  XR 25mg  daily. Ordered CMP to assess potassium level with a  low potassium back on 09/05/2024.    Anxiety state Assessment & Plan: Improving. Patient scored 15 on PHQ-9 and 17 on GAD-7. Continue Escitalopram  10mg  daily. Refilled medication. Discussed about Hydroxyzine  can be used for anxiety and insomnia too. He may need an increase in medication, but will let him contact Dr. Iva about this because of him prescribing for allergies, not anxiety and insomnia. May need to take over management of medication.   Orders: -     Escitalopram  Oxalate; Take 1 tablet (10 mg total) by mouth daily.  Dispense: 90 tablet; Refill: 0  Current severe episode of major depressive disorder without psychotic features, unspecified whether recurrent [facility]) Assessment & Plan: Improving. Patient scored 15 on PHQ-9 and 17 on GAD-7. Continue Escitalopram  10mg  daily. Refilled medication. Discussed about Hydroxyzine  can be used for anxiety and insomnia too. He may need an increase in medication, but will let him contact Dr. Iva about this because of him prescribing for allergies, not anxiety and insomnia. May need to take over management of medication.    Vitamin D  deficiency Assessment & Plan: Continue Vitamin D  supplement and will recheck in 2 months.    Prediabetes Assessment & Plan: Discussed about prediabetes. He is working on lifestyle modifications. Will recheck A1c in 2 months.    Mixed hyperlipidemia Assessment & Plan: Continue Rosuvastatin  10mg  daily. Will check CMP to evaluate liver function, but will also check CMP and lipid panel in 2 months. It is a little earlier for liver function to be abnormal with starting Rosuvastatin . Advised to be fasting at next appointment.    Low TSH level -     TSH -     T4, free -     T3, free  Hypokalemia -     Comprehensive metabolic panel with GFR  -Ordered TSH and Free T3 and T4 due to previous low TSH. If low, may need a referral to endocrinology. -Ordered CMP to assess potassium level from 11/09 ED visit.   -Provided gastroenterology referral letter to schedule an appointment for positive cologuard.  Return in about 2 months (around 11/28/2024) for chronic management.   Gaige Fussner, NP

## 2024-09-27 NOTE — Assessment & Plan Note (Addendum)
 Improving. Patient scored 15 on PHQ-9 and 17 on GAD-7. Continue Escitalopram  10mg  daily. Refilled medication. Discussed about Hydroxyzine  can be used for anxiety and insomnia too. He may need an increase in medication, but will let him contact Dr. Iva about this because of him prescribing for allergies, not anxiety and insomnia. May need to take over management of medication.

## 2024-09-27 NOTE — Assessment & Plan Note (Signed)
 Blood pressures are stable in office and at home. Continue Lisinopril-HCTZ 20-25mg  daily and Toprol  XR 25mg  daily. Ordered CMP to assess potassium level with a low potassium back on 09/05/2024.

## 2024-09-27 NOTE — Assessment & Plan Note (Signed)
 Continue Vitamin D  supplement and will recheck in 2 months.

## 2024-09-27 NOTE — Assessment & Plan Note (Signed)
 Discussed about prediabetes. He is working on lifestyle modifications. Will recheck A1c in 2 months.

## 2024-09-27 NOTE — Assessment & Plan Note (Addendum)
 Continue Rosuvastatin  10mg  daily. Will check CMP to evaluate liver function, but will also check CMP and lipid panel in 2 months. It is a little earlier for liver function to be abnormal with starting Rosuvastatin . Advised to be fasting at next appointment.

## 2024-09-27 NOTE — Patient Instructions (Addendum)
-  It was great to see you this morning. -Continue all medications. Refilled Escitalopram  (Lexapro ) 10mg  daily.  -Ordered labs. Office will call with results.  -Provided gastroenterology referral letter to schedule an appointment for positive cologuard. -Please send a message about Dr. Iva changing or managing Hydroxyzine .  -Follow up in 2 months and please be fasting for lab collection.

## 2024-09-28 ENCOUNTER — Ambulatory Visit: Payer: Self-pay | Admitting: Family Medicine

## 2024-09-28 DIAGNOSIS — E876 Hypokalemia: Secondary | ICD-10-CM

## 2024-09-28 MED ORDER — POTASSIUM CHLORIDE ER 10 MEQ PO CPCR: 10.0000 meq | ORAL_CAPSULE | Freq: Every day | ORAL | 0 refills | Status: AC

## 2024-10-01 ENCOUNTER — Encounter: Payer: Self-pay | Admitting: Physician Assistant

## 2024-10-12 NOTE — Therapy (Signed)
 OUTPATIENT PHYSICAL THERAPY THORACOLUMBAR EVALUATION   Patient Name: Randall Meadows MRN: [id number] DOB:February 03, 1976, 48 y.o., male Today's Date: 10/13/2024  END OF SESSION:  PT End of Session - 10/13/24 0910     Visit Number 1    Authorization Type BLUE [employer]IC[employer] HEALTHY BLUE MEDICARE    Authorization Time Period please check auth    PT Start Time 0905    PT Stop Time 0945    PT Time Calculation (min) 40 min    Activity Tolerance Patient tolerated treatment well    Behavior During Therapy [facility] for tasks assessed/performed          Past Medical History:  Diagnosis Date   Anxiety    Asthma    Congenital hydrocephaly (HCC)    Depression    Hypertension    Memory impairment    Sciatica    Past Surgical History:  Procedure Laterality Date   BRAIN SURGERY     Shunt from Hydrocephaly   TONSILLECTOMY     Patient Active Problem List   Diagnosis Date Noted   Current severe episode of major depressive disorder without psychotic features (HCC) 09/27/2024   Prediabetes 09/27/2024   Vitamin D  deficiency 09/27/2024   Mixed hyperlipidemia 09/27/2024   Mild persistent asthma, uncomplicated 10/04/2022   Seasonal allergic rhinitis due to pollen 10/04/2022   Seasonal allergic conjunctivitis 10/04/2022   Anxiety state 04/03/2009   OBESITY 09/07/2007   Essential hypertension 09/07/2007   Allergic rhinitis 09/07/2007   Headache 09/07/2007   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/2007    PCP: Billy Philippe SAUNDERS, NP  REFERRING PROVIDER: Hilma Hastings, PA-C  REFERRING DIAG:  M54.16 (ICD-10-CM) - Lumbar radiculopathy  M47.816 (ICD-10-CM) - Lumbar spondylosis  M51.362 (ICD-10-CM) - Degeneration of intervertebral disc of lumbar region with discogenic back pain and lower extremity pain    Rationale for Evaluation and Treatment: Rehabilitation  THERAPY DIAG:  Low back pain, unspecified back pain laterality, unspecified chronicity, unspecified  whether sciatica present  Radiculopathy, lumbar region  Muscle weakness (generalized)  ONSET DATE: chronic  SUBJECTIVE:                                                                                                                                                                                           SUBJECTIVE STATEMENT: Patient well known to this clinic.  Low back pain for years but seems to be worse lately.  Pain radiates down into right leg and sometimes the left.  New x-ray and MRI showed disc bulge and arthritis.  Has a hard time with prolonged sitting, standing, walking and driving.  Scheduled for  an injection next month  PERTINENT HISTORY:    PAIN:  Are you having pain? Yes: NPRS scale: 9/10 Pain location: low back and down legs Pain description: aching and burning and numbness Aggravating factors: prolonged position Relieving factors: heat and back aid  PRECAUTIONS: None  RED FLAGS: None   WEIGHT BEARING RESTRICTIONS: No  FALLS:  Has patient fallen in last 6 months? No   OCCUPATION: disability  PLOF: Independent  PATIENT GOALS: get back to 100%  NEXT MD VISIT: next month consultation about injection  OBJECTIVE:  Note: Objective measures were completed at Evaluation unless otherwise noted.  DIAGNOSTIC FINDINGS:  IMPRESSION: 1. Small right foraminal disc protrusion at L4-5, potentially affecting the right L4 nerve root. 2. Left foraminal disc protrusion at L3-4, contacting and potentially affecting the exiting left L3 nerve root. 3. Mild to moderate bilateral L3 through L5 foraminal stenosis related disc bulge and facet hypertrophy. 4. Mild reactive marrow edema about the left L4-5 facet due to facet arthritis. Finding could serve as a source for lower back pain.     Electronically Signed   By: Morene Hoard M.D.   On: 08/30/2024 05:27  PATIENT SURVEYS:  Modified Oswestry:  MODIFIED OSWESTRY DISABILITY SCALE  Date: 10/13/2024 Score                                 Total 28/50; 56%   Interpretation of scores: Score Category Description  0-20% Minimal Disability The patient can cope with most living activities. Usually no treatment is indicated apart from advice on lifting, sitting and exercise  21-40% Moderate Disability The patient experiences more pain and difficulty with sitting, lifting and standing. Travel and social life are more difficult and they may be disabled from work. Personal care, sexual activity and sleeping are not grossly affected, and the patient can usually be managed by conservative means  41-60% Severe Disability Pain remains the main problem in this group, but activities of daily living are affected. These patients require a detailed investigation  61-80% Crippled Back pain impinges on all aspects of the patients life. Positive intervention is required  81-100% Bed-bound These patients are either bed-bound or exaggerating their symptoms  Bluford FORBES Zoe DELENA Karon DELENA, et al. Surgery versus conservative management of stable thoracolumbar fracture: the PRESTO feasibility RCT. [place] ([place]): [employer]; 2021 Nov. [facility] Technology Assessment, No. 25.62.) Appendix 3, Oswestry Disability Index category descriptors. Available from: Findjewelers.cz  Minimally Clinically Important Difference (MCID) = 12.8%  COGNITION: Overall cognitive status: Within functional limits for tasks assessed     SENSATION: WFL; but reports numb sensation  down right leg  MUSCLE LENGTH: Hamstrings: check next visit  POSTURE: rounded shoulders, forward head, and increased lumbar lordosis  PALPATION: Tender L2-L5 paraspinals  LUMBAR ROM:   AROM eval  Flexion Full fingers to toes  Extension 40% available pain  Right lateral flexion   Left lateral flexion   Right rotation   Left rotation    (Blank rows = not tested)  LOWER EXTREMITY ROM:     Active  Right eval  Left eval  Hip flexion    Hip extension    Hip abduction    Hip adduction    Hip internal rotation    Hip external rotation    Knee flexion    Knee extension    Ankle dorsiflexion    Ankle plantarflexion    Ankle inversion  Ankle eversion     (Blank rows = not tested)  LOWER EXTREMITY MMT:    MMT Right eval Left eval  Hip flexion 4 4+  Hip extension 3+ 4  Hip abduction    Hip adduction    Hip internal rotation    Hip external rotation    Knee flexion 4 4  Knee extension 4 5  Ankle dorsiflexion 4+ 5  Ankle plantarflexion    Ankle inversion    Ankle eversion     (Blank rows = not tested)  FUNCTIONAL TESTS:  5 times sit to stand: 23.73 sec hands on thighs  GAIT: Distance walked: 60 ft Assistive device utilized: None Level of assistance: Modified independence Comments: slight antalgic gait  TREATMENT DATE: 10/13/24 physical therapy evaluation and HEP instruction ; moist heat to low back in prone x 5'                                                                                                                              PATIENT EDUCATION:  Education details: Patient educated on exam findings, POC, scope of PT, HEP, and what to expect next visit. Person educated: Patient Education method: Explanation, Demonstration, and Handouts Education comprehension: verbalized understanding, returned demonstration, verbal cues required, and tactile cues required  HOME EXERCISE PROGRAM: Access Code: [email or web address] URL: [email or web address] Date: 10/13/2024 Prepared by: AP - Rehab  Exercises - Lying Prone  - 1 x daily - 7 x weekly - 1 sets - 10 reps - Standing Lumbar Extension with Counter  - 1 x daily - 7 x weekly - 1 sets - 10 reps  ASSESSMENT:  CLINICAL IMPRESSION: Patient is a 48 y.o. male who was seen today for physical therapy evaluation and treatment for  M54.16 (ICD-10-CM) - Lumbar radiculopathy  M47.816 (ICD-10-CM) - Lumbar spondylosis  M51.362  (ICD-10-CM) - Degeneration of intervertebral disc of lumbar region with discogenic back pain and lower extremity pain  .   OBJECTIVE IMPAIRMENTS: Abnormal gait, decreased activity tolerance, difficulty walking, decreased strength, and pain.   ACTIVITY LIMITATIONS: carrying, lifting, bending, sitting, standing, squatting, sleeping, stairs, transfers, bed mobility, and locomotion level  PARTICIPATION LIMITATIONS: meal prep, cleaning, laundry, driving, shopping, and community activity  EHAB POTENTIAL: Good  CLINICAL DECISION MAKING: Evolving/moderate complexity  EVALUATION COMPLEXITY: Moderate   GOALS: Goals reviewed with patient? No  SHORT TERM GOALS: Target date: 10/27/2024  patient will be independent with initial HEP and compliant with HEP 3-4 times a week   Baseline: Goal status: INITIAL  2.  Patient will report 50% improvement overall  Baseline:  Goal status: INITIAL  LONG TERM GOALS: Target date: 11/10/2024  Patient will be independent in self management strategies to improve quality of life and functional outcomes.  Baseline:  Goal status: INITIAL  2.  Patient will report 70% improvement overall  Baseline:  Goal status: INITIAL  3.  Patient will improve 5 times sit to stand score to 15 sec or less to  demonstrate improved functional mobility and increased leg strength.    Baseline: 23.73 sec Goal status: INITIAL  4.   Patient will increase right leg MMT's to 4+ to 5/5 to allow navigation of steps without gait deviation or loss of balance  Baseline: see above Goal status: INITIAL  5.  Patient will improve Modified Oswestry score by 8 points to demonstrate improved perceived function  Baseline: 28/50 Goal status: INITIAL  PLAN:  PT FREQUENCY: 2x/week  PT DURATION: 4 weeks  PLANNED INTERVENTIONS: 97164- PT Re-evaluation, 97110-Therapeutic exercises, 97530- Therapeutic activity, 97112- Neuromuscular re-education, 97535- Self Care, 02859- Manual therapy,  [id number]- Gait training, [phone]- Orthotic Fit/training, [phone]- Canalith repositioning, [id number]- Aquatic Therapy, 97760- Splinting, [phone]- Wound care (first 20 sq cm), 97598- Wound care (each additional 20 sq cm)Patient/Family education, Balance training, Stair training, Taping, Dry Needling, Joint mobilization, Joint manipulation, Spinal manipulation, Spinal mobilization, Scar mobilization, and DME instructions. SABRA  PLAN FOR NEXT SESSION: Review HEP and goals; lumbar mobility; extension first then core strengthening   9:46 AM, Nov 01, 2024 Courtlynn Holloman Small Kathrynn Backstrom MPT [facility] [place] [phone] Ph:[phone]   Managed Medicaid Authorization Request Treatment Start Date: 11/01/2024  Visit Dx Codes: M54.50, M54.16, M62.81  Functional Tool Score: modified Oswestry 28/50; 56%  For all possible CPT codes, reference the Planned Interventions line above.     Check all conditions that are expected to impact treatment: {Conditions expected to impact treatment:None of these apply   If treatment provided at initial evaluation, no treatment charged due to lack of authorization.

## 2024-10-13 ENCOUNTER — Other Ambulatory Visit: Payer: Self-pay

## 2024-10-13 ENCOUNTER — Ambulatory Visit (HOSPITAL_COMMUNITY)

## 2024-10-13 DIAGNOSIS — M51362 Other intervertebral disc degeneration, lumbar region with discogenic back pain and lower extremity pain: Secondary | ICD-10-CM | POA: Insufficient documentation

## 2024-10-13 DIAGNOSIS — M47816 Spondylosis without myelopathy or radiculopathy, lumbar region: Secondary | ICD-10-CM | POA: Diagnosis not present

## 2024-10-13 DIAGNOSIS — M5416 Radiculopathy, lumbar region: Secondary | ICD-10-CM | POA: Diagnosis present

## 2024-10-13 DIAGNOSIS — M6281 Muscle weakness (generalized): Secondary | ICD-10-CM | POA: Diagnosis present

## 2024-10-13 DIAGNOSIS — M545 Low back pain, unspecified: Secondary | ICD-10-CM | POA: Insufficient documentation

## 2024-10-14 ENCOUNTER — Other Ambulatory Visit

## 2024-10-14 DIAGNOSIS — E876 Hypokalemia: Secondary | ICD-10-CM

## 2024-10-14 DIAGNOSIS — E559 Vitamin D deficiency, unspecified: Secondary | ICD-10-CM | POA: Diagnosis not present

## 2024-10-14 LAB — COMPREHENSIVE METABOLIC PANEL WITH GFR
ALT: 18 U/L (ref 3–53)
AST: 21 U/L (ref 5–37)
Albumin: 4.2 g/dL (ref 3.5–5.2)
Alkaline Phosphatase: 60 U/L (ref 39–117)
BUN: 16 mg/dL (ref 6–23)
CO2: 31 meq/L (ref 19–32)
Calcium: 9.2 mg/dL (ref 8.4–10.5)
Chloride: 100 meq/L (ref 96–112)
Creatinine, Ser: 1.13 mg/dL (ref 0.40–1.50)
GFR: 75.74 mL/min (ref >60.00)
Glucose, Bld: 87 mg/dL (ref 70–99)
Potassium: 3.7 meq/L (ref 3.5–5.1)
Sodium: 140 meq/L (ref 135–145)
Total Bilirubin: 0.6 mg/dL (ref 0.2–1.2)
Total Protein: 7.3 g/dL (ref 6.0–8.3)

## 2024-10-14 LAB — VITAMIN D 25 HYDROXY (VIT D DEFICIENCY, FRACTURES): VITD: 35.53 ng/mL (ref 30.00–100.00)

## 2024-10-15 ENCOUNTER — Ambulatory Visit: Payer: Self-pay | Admitting: Family Medicine

## 2024-10-18 ENCOUNTER — Encounter: Payer: Self-pay | Admitting: Allergy & Immunology

## 2024-10-19 ENCOUNTER — Encounter: Payer: Self-pay | Admitting: Family Medicine

## 2024-10-25 ENCOUNTER — Ambulatory Visit (HOSPITAL_BASED_OUTPATIENT_CLINIC_OR_DEPARTMENT_OTHER): Admitting: Nurse Practitioner

## 2024-10-25 ENCOUNTER — Ambulatory Visit (HOSPITAL_COMMUNITY)

## 2024-10-27 MED ORDER — HYDROXYZINE HCL 50 MG PO TABS: 50.0000 mg | ORAL_TABLET | Freq: Every day | ORAL | 1 refills | Status: AC

## 2024-10-29 ENCOUNTER — Ambulatory Visit (HOSPITAL_COMMUNITY): Attending: Orthopedic Surgery

## 2024-10-29 DIAGNOSIS — M545 Low back pain, unspecified: Secondary | ICD-10-CM | POA: Insufficient documentation

## 2024-10-29 DIAGNOSIS — M5416 Radiculopathy, lumbar region: Secondary | ICD-10-CM | POA: Insufficient documentation

## 2024-10-29 DIAGNOSIS — R29898 Other symptoms and signs involving the musculoskeletal system: Secondary | ICD-10-CM | POA: Insufficient documentation

## 2024-10-29 DIAGNOSIS — M6281 Muscle weakness (generalized): Secondary | ICD-10-CM | POA: Diagnosis present

## 2024-10-29 NOTE — Therapy (Signed)
 " OUTPATIENT PHYSICAL THERAPY THORACOLUMBAR TREATMENT   Patient Name: DEMARIE HYNEMAN MRN: [id number] DOB:1976-03-28, 49 y.o., male Today's Date: 10/29/2024  END OF SESSION:  PT End of Session - 10/29/24 0942     Visit Number 2    Number of Visits 8    Date for Recertification  11/10/24    Authorization Type BLUE CROSS BLUE [employer]IELD MEDICARE HEALTHY BLUE MEDICARE    Authorization Time Period no auth needed    PT Start Time [phone]    PT Stop Time 1023    PT Time Calculation (min) 40 min    Activity Tolerance Patient tolerated treatment well    Behavior During Therapy WFL for tasks assessed/performed          Past Medical History:  Diagnosis Date   Anxiety    Asthma    Congenital hydrocephaly (HCC)    Depression    Hypertension    Memory impairment    Sciatica    Past Surgical History:  Procedure Laterality Date   BRAIN SURGERY     Shunt from Hydrocephaly   TONSILLECTOMY     Patient Active Problem List   Diagnosis Date Noted   Current severe episode of major depressive disorder without psychotic features (HCC) 09/27/2024   Prediabetes 09/27/2024   Vitamin D  deficiency 09/27/2024   Mixed hyperlipidemia 09/27/2024   Mild persistent asthma, uncomplicated 10/04/2022   Seasonal allergic rhinitis due to pollen 10/04/2022   Seasonal allergic conjunctivitis 10/04/2022   Anxiety state 04/03/2009   OBESITY 09/07/2007   Essential hypertension 09/07/2007   Allergic rhinitis 09/07/2007   Headache 09/07/2007   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/2007    PCP: Billy Philippe SAUNDERS, NP  REFERRING PROVIDER: Hilma Hastings, PA-C  REFERRING DIAG:  M54.16 (ICD-10-CM) - Lumbar radiculopathy  M47.816 (ICD-10-CM) - Lumbar spondylosis  M51.362 (ICD-10-CM) - Degeneration of intervertebral disc of lumbar region with discogenic back pain and lower extremity pain    Rationale for Evaluation and Treatment: Rehabilitation  THERAPY DIAG:  Low back pain, unspecified back  pain laterality, unspecified chronicity, unspecified whether sciatica present  Radiculopathy, lumbar region  Muscle weakness (generalized)  Other symptoms and signs involving the musculoskeletal system  ONSET DATE: chronic  SUBJECTIVE:                                                                                                                                                                                           SUBJECTIVE STATEMENT: Patient reports he is cold this morning; woke up with his back hurting a little bit.  Has been compliant with HEP over the holidays. His Left knee popped  yesterday and caused him some pain.  Back Pain between a 3-4/10 today; taking an Aleve  every 8 hours or so.  Has an appt with Harlene Boys next week 11/04/24  EVAL:Patient well known to this clinic.  Low back pain for years but seems to be worse lately.  Pain radiates down into right leg and sometimes the left.  New x-ray and MRI showed disc bulge and arthritis.  Has a hard time with prolonged sitting, standing, walking and driving.  Scheduled for an injection next month  PERTINENT HISTORY:    PAIN:  Are you having pain? Yes: NPRS scale: 9/10 Pain location: low back and down legs Pain description: aching and burning and numbness Aggravating factors: prolonged position Relieving factors: heat and back aid  PRECAUTIONS: None  RED FLAGS: None   WEIGHT BEARING RESTRICTIONS: No  FALLS:  Has patient fallen in last 6 months? No   OCCUPATION: disability  PLOF: Independent  PATIENT GOALS: get back to 100%  NEXT MD VISIT: next month consultation about injection  OBJECTIVE:  Note: Objective measures were completed at Evaluation unless otherwise noted.  DIAGNOSTIC FINDINGS:  IMPRESSION: 1. Small right foraminal disc protrusion at L4-5, potentially affecting the right L4 nerve root. 2. Left foraminal disc protrusion at L3-4, contacting and potentially affecting the exiting left L3 nerve  root. 3. Mild to moderate bilateral L3 through L5 foraminal stenosis related disc bulge and facet hypertrophy. 4. Mild reactive marrow edema about the left L4-5 facet due to facet arthritis. Finding could serve as a source for lower back pain.     Electronically Signed   By: Morene Hoard M.D.   On: 08/30/2024 05:27  PATIENT SURVEYS:  Modified Oswestry:  MODIFIED OSWESTRY DISABILITY SCALE  Date: 10/13/2024 Score                                Total 28/50; 56%   Interpretation of scores: Score Category Description  0-20% Minimal Disability The patient can cope with most living activities. Usually no treatment is indicated apart from advice on lifting, sitting and exercise  21-40% Moderate Disability The patient experiences more pain and difficulty with sitting, lifting and standing. Travel and social life are more difficult and they may be disabled from work. Personal care, sexual activity and sleeping are not grossly affected, and the patient can usually be managed by conservative means  41-60% Severe Disability Pain remains the main problem in this group, but activities of daily living are affected. These patients require a detailed investigation  61-80% Crippled Back pain impinges on all aspects of the patients life. Positive intervention is required  81-100% Bed-bound These patients are either bed-bound or exaggerating their symptoms  Bluford FORBES Zoe DELENA Karon DELENA, et al. Surgery versus conservative management of stable thoracolumbar fracture: the PRESTO feasibility RCT. [place] ([place]): [employer]; 2021 Nov. [facility] Technology Assessment, No. 25.62.) Appendix 3, Oswestry Disability Index category descriptors. Available from: Findjewelers.cz  Minimally Clinically Important Difference (MCID) = 12.8%  COGNITION: Overall cognitive status: Within functional limits for tasks assessed     SENSATION: WFL; but reports numb  sensation  down right leg  MUSCLE LENGTH: Hamstrings: check next visit  POSTURE: rounded shoulders, forward head, and increased lumbar lordosis  PALPATION: Tender L2-L5 paraspinals  LUMBAR ROM:   AROM eval  Flexion Full fingers to toes  Extension 40% available pain  Right lateral flexion   Left lateral flexion  Right rotation   Left rotation    (Blank rows = not tested)  LOWER EXTREMITY ROM:     Active  Right eval Left eval  Hip flexion    Hip extension    Hip abduction    Hip adduction    Hip internal rotation    Hip external rotation    Knee flexion    Knee extension    Ankle dorsiflexion    Ankle plantarflexion    Ankle inversion    Ankle eversion     (Blank rows = not tested)  LOWER EXTREMITY MMT:    MMT Right eval Left eval  Hip flexion 4 4+  Hip extension 3+ 4  Hip abduction    Hip adduction    Hip internal rotation    Hip external rotation    Knee flexion 4 4  Knee extension 4 5  Ankle dorsiflexion 4+ 5  Ankle plantarflexion    Ankle inversion    Ankle eversion     (Blank rows = not tested)  FUNCTIONAL TESTS:  5 times sit to stand: 23.73 sec hands on thighs  GAIT: Distance walked: 60 ft Assistive device utilized: None Level of assistance: Modified independence Comments: slight antalgic gait  TREATMENT DATE:   10/29/2024 Review of HEP and goals Prone position with moist heat x 5' to decrease pain and improve soft tissue mobility Prone glute sets 5 hold x 10 Hamstring curls 2 x 10 each Standing lumbar extension over // bar x 12 Standing heel raises x 20 Slant board 5 x 10 Updated HEP      10/13/24 physical therapy evaluation and HEP instruction ; moist heat to low back in prone x 5'                                                                                                                              PATIENT EDUCATION:  Education details: Patient educated on exam findings, POC, scope of PT, HEP, and what to expect  next visit. Person educated: Patient Education method: Explanation, Demonstration, and Handouts Education comprehension: verbalized understanding, returned demonstration, verbal cues required, and tactile cues required  HOME EXERCISE PROGRAM: Access Code: [email or web address] Date: 10/29/2024 Exercises - Prone Gluteal Sets  - 1 x daily - 7 x weekly - 1 sets - 10 reps - 5 sec hold - Prone Knee Flexion  - 1 x daily - 7 x weekly - 2 sets - 10 reps  Access Code: [email or web address] URL: [email or web address] Date: 10/13/2024 Prepared by: AP - Rehab  Exercises - Lying Prone  - 1 x daily - 7 x weekly - 1 sets - 10 reps - Standing Lumbar Extension with Counter  - 1 x daily - 7 x weekly - 1 sets - 10 reps  ASSESSMENT:  CLINICAL IMPRESSION: Today's session started with a review of HEP and goals.  Patient verbalizes agreement with set rehab goals.  Started in prone position; progressed strengthening exercises today in  prone position without issue and updated HEP. Noted tenderness to L3-L5 area today on palpation.  No report of increased pain with new exercises.   Patient will benefit from continued skilled therapy services to address deficits and promote return to optimal function.         Eval:Patient is a 49 y.o. male who was seen today for physical therapy evaluation and treatment for  M54.16 (ICD-10-CM) - Lumbar radiculopathy  M47.816 (ICD-10-CM) - Lumbar spondylosis  M51.362 (ICD-10-CM) - Degeneration of intervertebral disc of lumbar region with discogenic back pain and lower extremity pain  Patient demonstrates muscle weakness, reduced ROM, and fascial restrictions which are likely contributing to symptoms of pain and are negatively impacting patient ability to perform ADLs and functional mobility tasks. Patient will benefit from skilled physical therapy services to address these deficits to reduce pain and improve level of function with ADLs and functional mobility tasks.   OBJECTIVE  IMPAIRMENTS: Abnormal gait, decreased activity tolerance, difficulty walking, decreased strength, and pain.   ACTIVITY LIMITATIONS: carrying, lifting, bending, sitting, standing, squatting, sleeping, stairs, transfers, bed mobility, and locomotion level  PARTICIPATION LIMITATIONS: meal prep, cleaning, laundry, driving, shopping, and community activity  EHAB POTENTIAL: Good  CLINICAL DECISION MAKING: Evolving/moderate complexity  EVALUATION COMPLEXITY: Moderate   GOALS: Goals reviewed with patient? No  SHORT TERM GOALS: Target date: 10/27/2024  patient will be independent with initial HEP and compliant with HEP 3-4 times a week   Baseline: Goal status: in progress  2.  Patient will report 50% improvement overall  Baseline:  Goal status: in progress  LONG TERM GOALS: Target date: 11/10/2024  Patient will be independent in self management strategies to improve quality of life and functional outcomes.  Baseline:  Goal status: in progress  2.  Patient will report 70% improvement overall  Baseline:  Goal status: in progress  3.  Patient will improve 5 times sit to stand score to 15 sec or less to demonstrate improved functional mobility and increased leg strength.    Baseline: 23.73 sec Goal status: in progress  4.   Patient will increase right leg MMT's to 4+ to 5/5 to allow navigation of steps without gait deviation or loss of balance  Baseline: see above Goal status: in progress  5.  Patient will improve Modified Oswestry score by 8 points to demonstrate improved perceived function  Baseline: 28/50 Goal status: in progress  PLAN:  PT FREQUENCY: 2x/week  PT DURATION: 4 weeks  PLANNED INTERVENTIONS: 97164- PT Re-evaluation, 97110-Therapeutic exercises, 97530- Therapeutic activity, 97112- Neuromuscular re-education, 97535- Self Care, 02859- Manual therapy, [id number]- Gait training, [phone]- Orthotic Fit/training, [phone]- Canalith repositioning, [id number]- Aquatic Therapy,  97760- Splinting, [phone]- Wound care (first 20 sq cm), 97598- Wound care (each additional 20 sq cm)Patient/Family education, Balance training, Stair training, Taping, Dry Needling, Joint mobilization, Joint manipulation, Spinal manipulation, Spinal mobilization, Scar mobilization, and DME instructions. SABRA  PLAN FOR NEXT SESSION: lumbar mobility; extension first then core strengthening   10:22 AM, 10/29/2024 Annia Gomm Small Bassheva Flury MPT [facility] [place] [phone] Ph:[phone]   "

## 2024-11-03 ENCOUNTER — Ambulatory Visit (HOSPITAL_COMMUNITY)

## 2024-11-03 ENCOUNTER — Encounter (HOSPITAL_COMMUNITY): Payer: Self-pay

## 2024-11-03 DIAGNOSIS — M5416 Radiculopathy, lumbar region: Secondary | ICD-10-CM

## 2024-11-03 DIAGNOSIS — M6281 Muscle weakness (generalized): Secondary | ICD-10-CM

## 2024-11-03 DIAGNOSIS — R29898 Other symptoms and signs involving the musculoskeletal system: Secondary | ICD-10-CM

## 2024-11-03 DIAGNOSIS — M545 Low back pain, unspecified: Secondary | ICD-10-CM | POA: Diagnosis not present

## 2024-11-03 NOTE — Therapy (Signed)
 " OUTPATIENT PHYSICAL THERAPY THORACOLUMBAR TREATMENT   Patient Name: Randall Meadows MRN: [id number] DOB:[birth year], 49 y.o., male Today's Date: 11/03/2024  END OF SESSION:  PT End of Session - 11/03/24 1118     Visit Number 3    Number of Visits 8    Date for Recertification  11/10/24    Authorization Type [employer]    Authorization Time Period no auth needed    PT Start Time 1120    PT Stop Time 1158    PT Time Calculation (min) 38 min    Activity Tolerance Patient tolerated treatment well    Behavior During Therapy WFL for tasks assessed/performed          Past Medical History:  Diagnosis Date   Anxiety    Asthma    Congenital hydrocephaly (HCC)    Depression    Hypertension    Memory impairment    Sciatica    Past Surgical History:  Procedure Laterality Date   BRAIN SURGERY     Shunt from Hydrocephaly   TONSILLECTOMY     Patient Active Problem List   Diagnosis Date Noted   Current severe episode of major depressive disorder without psychotic features (HCC) 09/27/2024   Prediabetes 09/27/2024   Vitamin D  deficiency 09/27/2024   Mixed hyperlipidemia 09/27/2024   Mild persistent asthma, uncomplicated 10/04/2022   Seasonal allergic rhinitis due to pollen 10/04/2022   Seasonal allergic conjunctivitis 10/04/2022   Anxiety state 04/03/2009   OBESITY 09/07/2007   Essential hypertension 09/07/2007   Allergic rhinitis 09/07/2007   Headache 09/07/2007   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/2007    PCP: Billy Philippe SAUNDERS, NP  REFERRING PROVIDER: Hilma Hastings, PA-C  REFERRING DIAG:  M54.16 (ICD-10-CM) - Lumbar radiculopathy  M47.816 (ICD-10-CM) - Lumbar spondylosis  M51.362 (ICD-10-CM) - Degeneration of intervertebral disc of lumbar region with discogenic back pain and lower extremity pain    Rationale for Evaluation and Treatment: Rehabilitation  THERAPY DIAG:  Low back pain, unspecified back  pain laterality, unspecified chronicity, unspecified whether sciatica present  Radiculopathy, lumbar region  Muscle weakness (generalized)  Other symptoms and signs involving the musculoskeletal system  ONSET DATE: chronic  SUBJECTIVE:                                                                                                                                                                                           SUBJECTIVE STATEMENT: Reports LBP 4/10 and radicular symptoms Rt posterior thigh pain ending at knee. Saw MD in [place], discussed MRI findings included DDD and arthritis and more than 1 buldging  disc, returns 11/18/23, will discuss injections.  Increased gabapentin  to 200mg  for 4 days then increase to 3 times a day and continue with PT.  EVAL:Patient well known to this clinic.  Low back pain for years but seems to be worse lately.  Pain radiates down into right leg and sometimes the left.  New x-ray and MRI showed disc bulge and arthritis.  Has a hard time with prolonged sitting, standing, walking and driving.  Scheduled for an injection next month  PERTINENT HISTORY:    PAIN:  Are you having pain? Yes: NPRS scale: 9/10 Pain location: low back and down legs Pain description: aching and burning and numbness Aggravating factors: prolonged position Relieving factors: heat and back aid  PRECAUTIONS: None  RED FLAGS: None   WEIGHT BEARING RESTRICTIONS: No  FALLS:  Has patient fallen in last 6 months? No   OCCUPATION: disability  PLOF: Independent  PATIENT GOALS: get back to 100%  NEXT MD VISIT: next month consultation about injection  OBJECTIVE:  Note: Objective measures were completed at Evaluation unless otherwise noted.  DIAGNOSTIC FINDINGS:  IMPRESSION: 1. Small right foraminal disc protrusion at L4-5, potentially affecting the right L4 nerve root. 2. Left foraminal disc protrusion at L3-4, contacting and potentially affecting the exiting left L3  nerve root. 3. Mild to moderate bilateral L3 through L5 foraminal stenosis related disc bulge and facet hypertrophy. 4. Mild reactive marrow edema about the left L4-5 facet due to facet arthritis. Finding could serve as a source for lower back pain.     Electronically Signed   By: Morene Hoard M.D.   On: 08/30/2024 05:27  PATIENT SURVEYS:  Modified Oswestry:  MODIFIED OSWESTRY DISABILITY SCALE  Date: 10/13/2024 Score                                Total 28/50; 56%   Interpretation of scores: Score Category Description  0-20% Minimal Disability The patient can cope with most living activities. Usually no treatment is indicated apart from advice on lifting, sitting and exercise  21-40% Moderate Disability The patient experiences more pain and difficulty with sitting, lifting and standing. Travel and social life are more difficult and they may be disabled from work. Personal care, sexual activity and sleeping are not grossly affected, and the patient can usually be managed by conservative means  41-60% Severe Disability Pain remains the main problem in this group, but activities of daily living are affected. These patients require a detailed investigation  61-80% Crippled Back pain impinges on all aspects of the patients life. Positive intervention is required  81-100% Bed-bound These patients are either bed-bound or exaggerating their symptoms  Bluford FORBES Zoe DELENA Karon DELENA, et al. Surgery versus conservative management of stable thoracolumbar fracture: the PRESTO feasibility RCT. [place] ([place]): [employer]; 2021 Nov. [facility] Technology Assessment, No. 25.62.) Appendix 3, Oswestry Disability Index category descriptors. Available from: Findjewelers.cz  Minimally Clinically Important Difference (MCID) = 12.8%  COGNITION: Overall cognitive status: Within functional limits for tasks assessed     SENSATION: WFL; but reports numb  sensation  down right leg  MUSCLE LENGTH: Hamstrings: check next visit  POSTURE: rounded shoulders, forward head, and increased lumbar lordosis  PALPATION: Tender L2-L5 paraspinals  LUMBAR ROM:   AROM eval  Flexion Full fingers to toes  Extension 40% available pain  Right lateral flexion   Left lateral flexion   Right rotation   Left rotation    (  Blank rows = not tested)  LOWER EXTREMITY ROM:     Active  Right eval Left eval  Hip flexion    Hip extension    Hip abduction    Hip adduction    Hip internal rotation    Hip external rotation    Knee flexion    Knee extension    Ankle dorsiflexion    Ankle plantarflexion    Ankle inversion    Ankle eversion     (Blank rows = not tested)  LOWER EXTREMITY MMT:    MMT Right eval Left eval  Hip flexion 4 4+  Hip extension 3+ 4  Hip abduction    Hip adduction    Hip internal rotation    Hip external rotation    Knee flexion 4 4  Knee extension 4 5  Ankle dorsiflexion 4+ 5  Ankle plantarflexion    Ankle inversion    Ankle eversion     (Blank rows = not tested)  FUNCTIONAL TESTS:  5 times sit to stand: 23.73 sec hands on thighs  GAIT: Distance walked: 60 ft Assistive device utilized: None Level of assistance: Modified independence Comments: slight antalgic gait  TREATMENT DATE:  11/03/24:  Prone position with MHP - POE x 2 min - Press up 5x 10 - Heel squeeze 10x 5 - Hip extension 10 Supine:  - Bridge 10x 5  - Hamstring stretch 2x 30   - Piriformis stretch with towel assistance 2x 30  10/29/2024 Review of HEP and goals Prone position with moist heat x 5' to decrease pain and improve soft tissue mobility Prone glute sets 5 hold x 10 Hamstring curls 2 x 10 each Standing lumbar extension over // bar x 12 Standing heel raises x 20 Slant board 5 x 10 Updated HEP      10/13/24 physical therapy evaluation and HEP instruction ; moist heat to low back in prone x 5'                                                                                                                               PATIENT EDUCATION:  Education details: Patient educated on exam findings, POC, scope of PT, HEP, and what to expect next visit. Person educated: Patient Education method: Explanation, Demonstration, and Handouts Education comprehension: verbalized understanding, returned demonstration, verbal cues required, and tactile cues required  HOME EXERCISE PROGRAM: 11/03/24: Access Code: [email or web address] URL: [email or web address] Date: 11/03/2024 Prepared by: Augustin Mclean  Exercises - Prone Press Up On Elbows  - 2 x daily - 7 x weekly - 1 sets - 1 reps - 2' hold - Prone Press Up  - 1 x daily - 7 x weekly - 1 sets - 5 reps - 10 hold - Supine Bridge  - 2 x daily - 7 x weekly - 2 sets - 10 reps - 5 hold - Supine Figure 4 Piriformis Stretch  - 2 x daily - 7 x weekly -  2 sets - 3 reps - 30 hold  Access Code: [email or web address] Date: 10/29/2024 Exercises - Prone Gluteal Sets  - 1 x daily - 7 x weekly - 1 sets - 10 reps - 5 sec hold - Prone Knee Flexion  - 1 x daily - 7 x weekly - 2 sets - 10 reps  Access Code: [email or web address] URL: [email or web address] Date: 10/13/2024 Prepared by: [facility] - Rehab  Exercises - Lying Prone  - 1 x daily - 7 x weekly - 1 sets - 10 reps - Standing Lumbar Extension with Counter  - 1 x daily - 7 x weekly - 1 sets - 10 reps  ASSESSMENT:  CLINICAL IMPRESSION: Session focus with core and proximal strengthening to support lower back, continues with extension based exercises that was tolerated well.  Began prone position with MHP, added POE and press-up for lumbar mobility with positive reports.  Added gluteal strengthening exercises with cueing for mechanics and encouraged to breathe through session.  Stretches added for hip mobility.  EOS reports improved centralization with radicular symptoms and pain in lower back has reduced to 1/10.  Given additional prone  exercises, bridge and piriformis stretch printouts to add to HEP.   Eval:Patient is a 49 y.o. male who was seen today for physical therapy evaluation and treatment for  M54.16 (ICD-10-CM) - Lumbar radiculopathy  M47.816 (ICD-10-CM) - Lumbar spondylosis  M51.362 (ICD-10-CM) - Degeneration of intervertebral disc of lumbar region with discogenic back pain and lower extremity pain  Patient demonstrates muscle weakness, reduced ROM, and fascial restrictions which are likely contributing to symptoms of pain and are negatively impacting patient ability to perform ADLs and functional mobility tasks. Patient will benefit from skilled physical therapy services to address these deficits to reduce pain and improve level of function with ADLs and functional mobility tasks.   OBJECTIVE IMPAIRMENTS: Abnormal gait, decreased activity tolerance, difficulty walking, decreased strength, and pain.   ACTIVITY LIMITATIONS: carrying, lifting, bending, sitting, standing, squatting, sleeping, stairs, transfers, bed mobility, and locomotion level  PARTICIPATION LIMITATIONS: meal prep, cleaning, laundry, driving, shopping, and community activity  EHAB POTENTIAL: Good  CLINICAL DECISION MAKING: Evolving/moderate complexity  EVALUATION COMPLEXITY: Moderate   GOALS: Goals reviewed with patient? No  SHORT TERM GOALS: Target date: 10/27/2024  patient will be independent with initial HEP and compliant with HEP 3-4 times a week   Baseline: Goal status: in progress  2.  Patient will report 50% improvement overall  Baseline:  Goal status: in progress  LONG TERM GOALS: Target date: 11/10/2024  Patient will be independent in self management strategies to improve quality of life and functional outcomes.  Baseline:  Goal status: in progress  2.  Patient will report 70% improvement overall  Baseline:  Goal status: in progress  3.  Patient will improve 5 times sit to stand score to 15 sec or less to  demonstrate improved functional mobility and increased leg strength.    Baseline: 23.73 sec Goal status: in progress  4.   Patient will increase right leg MMT's to 4+ to 5/5 to allow navigation of steps without gait deviation or loss of balance  Baseline: see above Goal status: in progress  5.  Patient will improve Modified Oswestry score by 8 points to demonstrate improved perceived function  Baseline: 28/50 Goal status: in progress  PLAN:  PT FREQUENCY: 2x/week  PT DURATION: 4 weeks  PLANNED INTERVENTIONS: 97164- PT Re-evaluation, 97110-Therapeutic exercises, 97530- Therapeutic activity, [id number]- Neuromuscular re-education, 97535- Self Care, 02859- Manual therapy, [id number]- Gait  training, 02239- Orthotic Fit/training, 04007- Canalith repositioning, 02886- Aquatic Therapy, [id number]- Splinting, 02402- Wound care (first 20 sq cm), 97598- Wound care (each additional 20 sq cm)Patient/Family education, Balance training, Stair training, Taping, Dry Needling, Joint mobilization, Joint manipulation, Spinal manipulation, Spinal mobilization, Scar mobilization, and DME instructions. SABRA  PLAN FOR NEXT SESSION: lumbar mobility; extension first then core strengthening  Augustin Mclean, LPTA/CLT; CBIS [phone]  12:06 PM, 11/03/2024    "

## 2024-11-04 ENCOUNTER — Ambulatory Visit: Admitting: Orthopedic Surgery

## 2024-11-05 ENCOUNTER — Ambulatory Visit (HOSPITAL_COMMUNITY): Admitting: Physical Therapy

## 2024-11-05 DIAGNOSIS — M6281 Muscle weakness (generalized): Secondary | ICD-10-CM

## 2024-11-05 DIAGNOSIS — M545 Low back pain, unspecified: Secondary | ICD-10-CM

## 2024-11-05 DIAGNOSIS — M5416 Radiculopathy, lumbar region: Secondary | ICD-10-CM

## 2024-11-05 DIAGNOSIS — R29898 Other symptoms and signs involving the musculoskeletal system: Secondary | ICD-10-CM

## 2024-11-05 NOTE — Therapy (Signed)
 " OUTPATIENT PHYSICAL THERAPY THORACOLUMBAR TREATMENT   Patient Name: Randall Meadows MRN: [id number] DOB:06/16/1976, 49 y.o., male Today's Date: 11/05/2024  END OF SESSION:  PT End of Session - 11/05/24 1318     Visit Number 4    Number of Visits 8    Date for Recertification  11/10/24    Authorization Type [employer]    Authorization Time Period no auth needed    PT Start Time 1303    PT Stop Time 1342    PT Time Calculation (min) 39 min    Activity Tolerance Patient tolerated treatment well    Behavior During Therapy WFL for tasks assessed/performed           Past Medical History:  Diagnosis Date   Anxiety    Asthma    Congenital hydrocephaly (HCC)    Depression    Hypertension    Memory impairment    Sciatica    Past Surgical History:  Procedure Laterality Date   BRAIN SURGERY     Shunt from Hydrocephaly   TONSILLECTOMY     Patient Active Problem List   Diagnosis Date Noted   Current severe episode of major depressive disorder without psychotic features (HCC) 09/27/2024   Prediabetes 09/27/2024   Vitamin D  deficiency 09/27/2024   Mixed hyperlipidemia 09/27/2024   Mild persistent asthma, uncomplicated 10/04/2022   Seasonal allergic rhinitis due to pollen 10/04/2022   Seasonal allergic conjunctivitis 10/04/2022   Anxiety state 04/03/2009   OBESITY 09/07/2007   Essential hypertension 09/07/2007   Allergic rhinitis 09/07/2007   Headache 09/07/2007   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/2007    PCP: Billy Philippe SAUNDERS, NP  REFERRING PROVIDER: Hilma Hastings, PA-C  REFERRING DIAG:  M54.16 (ICD-10-CM) - Lumbar radiculopathy  M47.816 (ICD-10-CM) - Lumbar spondylosis  M51.362 (ICD-10-CM) - Degeneration of intervertebral disc of lumbar region with discogenic back pain and lower extremity pain    Rationale for Evaluation and Treatment: Rehabilitation  THERAPY DIAG:  Low back pain, unspecified  back pain laterality, unspecified chronicity, unspecified whether sciatica present  Radiculopathy, lumbar region  Muscle weakness (generalized)  Other symptoms and signs involving the musculoskeletal system  ONSET DATE: chronic  SUBJECTIVE:                                                                                                                                                                                           SUBJECTIVE STATEMENT: Pt reports 10/10 pain able to have conversation easily no facial or body expression of pain.  Therapist explains that if he is at a 10/10 he should  go to the ER.  PT states that this is not needed at this time.   EVAL:Patient well known to this clinic.  Low back pain for years but seems to be worse lately.  Pain radiates down into right leg and sometimes the left.  New x-ray and MRI showed disc bulge and arthritis.  Has a hard time with prolonged sitting, standing, walking and driving.  Scheduled for an injection next month  PERTINENT HISTORY:    PAIN:  Are you having pain? Yes: NPRS scale: 10/10 Pain location: low back and down legs Pain description: aching and burning and numbness Aggravating factors: prolonged position Relieving factors: heat and back aid  PRECAUTIONS: None  RED FLAGS: None   WEIGHT BEARING RESTRICTIONS: No  FALLS:  Has patient fallen in last 6 months? No   OCCUPATION: disability  PLOF: Independent  PATIENT GOALS: get back to 100%  NEXT MD VISIT: next month consultation about injection  OBJECTIVE:  Note: Objective measures were completed at Evaluation unless otherwise noted.  DIAGNOSTIC FINDINGS:  IMPRESSION: 1. Small right foraminal disc protrusion at L4-5, potentially affecting the right L4 nerve root. 2. Left foraminal disc protrusion at L3-4, contacting and potentially affecting the exiting left L3 nerve root. 3. Mild to moderate bilateral L3 through L5 foraminal stenosis related disc bulge and  facet hypertrophy. 4. Mild reactive marrow edema about the left L4-5 facet due to facet arthritis. Finding could serve as a source for lower back pain.     Electronically Signed   By: Morene Hoard M.D.   On: 08/30/2024 05:27  PATIENT SURVEYS:  Modified Oswestry:  MODIFIED OSWESTRY DISABILITY SCALE  Date: 10/13/2024 Score                                Total 28/50; 56%   Interpretation of scores: Score Category Description  0-20% Minimal Disability The patient can cope with most living activities. Usually no treatment is indicated apart from advice on lifting, sitting and exercise  21-40% Moderate Disability The patient experiences more pain and difficulty with sitting, lifting and standing. Travel and social life are more difficult and they may be disabled from work. Personal care, sexual activity and sleeping are not grossly affected, and the patient can usually be managed by conservative means  41-60% Severe Disability Pain remains the main problem in this group, but activities of daily living are affected. These patients require a detailed investigation  61-80% Crippled Back pain impinges on all aspects of the patients life. Positive intervention is required  81-100% Bed-bound These patients are either bed-bound or exaggerating their symptoms  Bluford FORBES Zoe DELENA Karon DELENA, et al. Surgery versus conservative management of stable thoracolumbar fracture: the PRESTO feasibility RCT. [place] ([place]): [employer]; 2021 Nov. [facility] Technology Assessment, No. 25.62.) Appendix 3, Oswestry Disability Index category descriptors. Available from: Findjewelers.cz  Minimally Clinically Important Difference (MCID) = 12.8%  COGNITION: Overall cognitive status: Within functional limits for tasks assessed     SENSATION: WFL; but reports numb sensation  down right leg  MUSCLE LENGTH: Hamstrings: check next visit  POSTURE: rounded  shoulders, forward head, and increased lumbar lordosis  PALPATION: Tender L2-L5 paraspinals  LUMBAR ROM:   AROM eval  Flexion Full fingers to toes  Extension 40% available pain  Right lateral flexion   Left lateral flexion   Right rotation   Left rotation    (Blank rows = not tested)  LOWER EXTREMITY ROM:     Active  Right eval Left eval  Hip flexion    Hip extension    Hip abduction    Hip adduction    Hip internal rotation    Hip external rotation    Knee flexion    Knee extension    Ankle dorsiflexion    Ankle plantarflexion    Ankle inversion    Ankle eversion     (Blank rows = not tested)  LOWER EXTREMITY MMT:    MMT Right eval Left eval  Hip flexion 4 4+  Hip extension 3+ 4  Hip abduction    Hip adduction    Hip internal rotation    Hip external rotation    Knee flexion 4 4  Knee extension 4 5  Ankle dorsiflexion 4+ 5  Ankle plantarflexion    Ankle inversion    Ankle eversion     (Blank rows = not tested)  FUNCTIONAL TESTS:  5 times sit to stand: 23.73 sec hands on thighs  GAIT: Distance walked: 60 ft Assistive device utilized: None Level of assistance: Modified independence Comments: slight antalgic gait  TREATMENT DATE:  11/05/24:  Prone position with MHP - POE x 2 min - Press up 5x 10 - Heel squeeze 10x 5 -glut squeeze 10  - Hip extension 10 Quadriped:  cat/horse x 5  Supine:            -knee to chest 30 x 3    - Bridge 10x 5   - Hamstring stretch 2x 30 With nerve flossing  Sitting- Piriformis stretch  2x 30             Sit to stand x10  Standing back extension x 10                 Heel raises x 10                Squat x 10                      PATIENT EDUCATION:  Education details: Patient educated on exam findings, POC, scope of PT, HEP, and what to expect next visit. Person educated: Patient Education method: Explanation, Demonstration, and Handouts Education comprehension: verbalized understanding, returned  demonstration, verbal cues required, and tactile cues required  HOME EXERCISE PROGRAM: 11/03/24: Access Code: [email or web address] URL: [email or web address] Date: 11/03/2024 Prepared by: Augustin Mclean  Exercises - Prone Press Up On Elbows  - 2 x daily - 7 x weekly - 1 sets - 1 reps - 2' hold - Prone Press Up  - 1 x daily - 7 x weekly - 1 sets - 5 reps - 10 hold - Supine Bridge  - 2 x daily - 7 x weekly - 2 sets - 10 reps - 5 hold - Supine Figure 4 Piriformis Stretch  - 2 x daily - 7 x weekly - 2 sets - 3 reps - 30 hold  Access Code: [email or web address] Date: 10/29/2024 Exercises - Prone Gluteal Sets  - 1 x daily - 7 x weekly - 1 sets - 10 reps - 5 sec hold - Prone Knee Flexion  - 1 x daily - 7 x weekly - 2 sets - 10 reps  Access Code: [email or web address] URL: [email or web address] Date: 10/13/2024 Prepared by: [facility] - Rehab  Exercises - Lying Prone  - 1 x daily - 7 x weekly - 1 sets - 10 reps - Standing Lumbar Extension with Counter  -  1 x daily - 7 x weekly - 1 sets - 10 reps  ASSESSMENT:  CLINICAL IMPRESSION: Session continue to focus with core and proximal strengthening to support lower back, continues with extension based exercises that was tolerated well.    Stretches added for back and hip mobility.  EOS reports improved centralization with radicular symptoms and pain in lower back has reduced to 1/10.  Given additional  HEP     Eval:Patient is a 49 y.o. male who was seen today for physical therapy evaluation and treatment for  M54.16 (ICD-10-CM) - Lumbar radiculopathy  M47.816 (ICD-10-CM) - Lumbar spondylosis  M51.362 (ICD-10-CM) - Degeneration of intervertebral disc of lumbar region with discogenic back pain and lower extremity pain  Patient demonstrates muscle weakness, reduced ROM, and fascial restrictions which are likely contributing to symptoms of pain and are negatively impacting patient ability to perform ADLs and functional mobility tasks. Patient will benefit from  skilled physical therapy services to address these deficits to reduce pain and improve level of function with ADLs and functional mobility tasks.   OBJECTIVE IMPAIRMENTS: Abnormal gait, decreased activity tolerance, difficulty walking, decreased strength, and pain.   ACTIVITY LIMITATIONS: carrying, lifting, bending, sitting, standing, squatting, sleeping, stairs, transfers, bed mobility, and locomotion level  PARTICIPATION LIMITATIONS: meal prep, cleaning, laundry, driving, shopping, and community activity  EHAB POTENTIAL: Good  CLINICAL DECISION MAKING: Evolving/moderate complexity  EVALUATION COMPLEXITY: Moderate   GOALS: Goals reviewed with patient? No  SHORT TERM GOALS: Target date: 10/27/2024  patient will be independent with initial HEP and compliant with HEP 3-4 times a week   Baseline: Goal status: in progress  2.  Patient will report 50% improvement overall  Baseline:  Goal status: in progress  LONG TERM GOALS: Target date: 11/10/2024  Patient will be independent in self management strategies to improve quality of life and functional outcomes.  Baseline:  Goal status: in progress  2.  Patient will report 70% improvement overall  Baseline:  Goal status: in progress  3.  Patient will improve 5 times sit to stand score to 15 sec or less to demonstrate improved functional mobility and increased leg strength.    Baseline: 23.73 sec Goal status: in progress  4.   Patient will increase right leg MMT's to 4+ to 5/5 to allow navigation of steps without gait deviation or loss of balance  Baseline: see above Goal status: in progress  5.  Patient will improve Modified Oswestry score by 8 points to demonstrate improved perceived function  Baseline: 28/50 Goal status: in progress  PLAN:  PT FREQUENCY: 2x/week  PT DURATION: 4 weeks  PLANNED INTERVENTIONS: 97164- PT Re-evaluation, 97110-Therapeutic exercises, 97530- Therapeutic activity, 97112- Neuromuscular  re-education, 97535- Self Care, [id number]- Manual therapy, [id number]- Gait training, [phone]- Orthotic Fit/training, [phone]- Canalith repositioning, [id number]- Aquatic Therapy, 97760- Splinting, [phone]- Wound care (first 20 sq cm), 97598- Wound care (each additional 20 sq cm)Patient/Family education, Balance training, Stair training, Taping, Dry Needling, Joint mobilization, Joint manipulation, Spinal manipulation, Spinal mobilization, Scar mobilization, and DME instructions. SABRA  PLAN FOR NEXT SESSION: lumbar mobility; extension first then core strengthening. Add Theraband postural exercises.  Dorthea Metro PT/CLT [phone]  1:46 PM, 11/05/2024    "

## 2024-11-08 ENCOUNTER — Ambulatory Visit (HOSPITAL_COMMUNITY)

## 2024-11-08 ENCOUNTER — Encounter (HOSPITAL_COMMUNITY): Payer: Self-pay

## 2024-11-08 DIAGNOSIS — M545 Low back pain, unspecified: Secondary | ICD-10-CM | POA: Diagnosis not present

## 2024-11-08 DIAGNOSIS — M6281 Muscle weakness (generalized): Secondary | ICD-10-CM

## 2024-11-08 DIAGNOSIS — M5416 Radiculopathy, lumbar region: Secondary | ICD-10-CM

## 2024-11-08 NOTE — Therapy (Signed)
 " OUTPATIENT PHYSICAL THERAPY THORACOLUMBAR TREATMENT   Patient Name: Randall Meadows MRN: [id number] DOB:14-Oct-1976, 49 y.o., male Today's Date: 11/08/2024  END OF SESSION:  PT End of Session - 11/08/24 1630     Visit Number 5    Number of Visits 8    Date for Recertification  11/10/24    Authorization Type BLUE [employer] MEDICARE HEALTHY BLUE MEDICARE    Authorization Time Period no auth needed    PT Start Time 1630    PT Stop Time 1713    PT Time Calculation (min) 43 min    Activity Tolerance Patient tolerated treatment well    Behavior During Therapy WFL for tasks assessed/performed            Past Medical History:  Diagnosis Date   Anxiety    Asthma    Congenital hydrocephaly (HCC)    Depression    Hypertension    Memory impairment    Sciatica    Past Surgical History:  Procedure Laterality Date   BRAIN SURGERY     Shunt from Hydrocephaly   TONSILLECTOMY     Patient Active Problem List   Diagnosis Date Noted   Current severe episode of major depressive disorder without psychotic features (HCC) 09/27/2024   Prediabetes 09/27/2024   Vitamin D  deficiency 09/27/2024   Mixed hyperlipidemia 09/27/2024   Mild persistent asthma, uncomplicated 10/04/2022   Seasonal allergic rhinitis due to pollen 10/04/2022   Seasonal allergic conjunctivitis 10/04/2022   Anxiety state 04/03/2009   OBESITY 09/07/2007   Essential hypertension 09/07/2007   Allergic rhinitis 09/07/2007   Headache 09/07/2007   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/2007    PCP: Billy Philippe SAUNDERS, NP  REFERRING PROVIDER: Hilma Hastings, PA-C  REFERRING DIAG:  M54.16 (ICD-10-CM) - Lumbar radiculopathy  M47.816 (ICD-10-CM) - Lumbar spondylosis  M51.362 (ICD-10-CM) - Degeneration of intervertebral disc of lumbar region with discogenic back pain and lower extremity pain    Rationale for Evaluation and Treatment: Rehabilitation  THERAPY DIAG:  Low back pain, unspecified  back pain laterality, unspecified chronicity, unspecified whether sciatica present  Radiculopathy, lumbar region  Muscle weakness (generalized)  ONSET DATE: chronic  SUBJECTIVE:                                                                                                                                                                                           SUBJECTIVE STATEMENT: Patient reports that he feels better. He felt good after his last appointment.   EVAL:Patient well known to this clinic.  Low back pain for years but seems to be worse lately.  Pain radiates  down into right leg and sometimes the left.  New x-ray and MRI showed disc bulge and arthritis.  Has a hard time with prolonged sitting, standing, walking and driving.  Scheduled for an injection next month  PERTINENT HISTORY:    PAIN:  Are you having pain? Yes: NPRS scale: 4/10 Pain location: low back and down legs Pain description: aching and burning and numbness Aggravating factors: prolonged position Relieving factors: heat and back aid  PRECAUTIONS: None  RED FLAGS: None   WEIGHT BEARING RESTRICTIONS: No  FALLS:  Has patient fallen in last 6 months? No   OCCUPATION: disability  PLOF: Independent  PATIENT GOALS: get back to 100%  NEXT MD VISIT: next month consultation about injection  OBJECTIVE:  Note: Objective measures were completed at Evaluation unless otherwise noted.  DIAGNOSTIC FINDINGS:  IMPRESSION: 1. Small right foraminal disc protrusion at L4-5, potentially affecting the right L4 nerve root. 2. Left foraminal disc protrusion at L3-4, contacting and potentially affecting the exiting left L3 nerve root. 3. Mild to moderate bilateral L3 through L5 foraminal stenosis related disc bulge and facet hypertrophy. 4. Mild reactive marrow edema about the left L4-5 facet due to facet arthritis. Finding could serve as a source for lower back pain.     Electronically Signed   By: Morene Hoard M.D.   On: 08/30/2024 05:27  PATIENT SURVEYS:  Modified Oswestry:  MODIFIED OSWESTRY DISABILITY SCALE  Date: 10/13/2024 Score                                Total 28/50; 56%   Interpretation of scores: Score Category Description  0-20% Minimal Disability The patient can cope with most living activities. Usually no treatment is indicated apart from advice on lifting, sitting and exercise  21-40% Moderate Disability The patient experiences more pain and difficulty with sitting, lifting and standing. Travel and social life are more difficult and they may be disabled from work. Personal care, sexual activity and sleeping are not grossly affected, and the patient can usually be managed by conservative means  41-60% Severe Disability Pain remains the main problem in this group, but activities of daily living are affected. These patients require a detailed investigation  61-80% Crippled Back pain impinges on all aspects of the patients life. Positive intervention is required  81-100% Bed-bound These patients are either bed-bound or exaggerating their symptoms  Bluford FORBES Zoe DELENA Karon DELENA, et al. Surgery versus conservative management of stable thoracolumbar fracture: the PRESTO feasibility RCT. [place] ([place]): [employer]; 2021 Nov. [facility] Technology Assessment, No. 25.62.) Appendix 3, Oswestry Disability Index category descriptors. Available from: Findjewelers.cz  Minimally Clinically Important Difference (MCID) = 12.8%  COGNITION: Overall cognitive status: Within functional limits for tasks assessed     SENSATION: WFL; but reports numb sensation  down right leg  MUSCLE LENGTH: Hamstrings: check next visit  POSTURE: rounded shoulders, forward head, and increased lumbar lordosis  PALPATION: Tender L2-L5 paraspinals  LUMBAR ROM:   AROM eval  Flexion Full fingers to toes  Extension 40% available pain  Right lateral  flexion   Left lateral flexion   Right rotation   Left rotation    (Blank rows = not tested)  LOWER EXTREMITY ROM:     Active  Right eval Left eval  Hip flexion    Hip extension    Hip abduction    Hip adduction    Hip internal rotation  Hip external rotation    Knee flexion    Knee extension    Ankle dorsiflexion    Ankle plantarflexion    Ankle inversion    Ankle eversion     (Blank rows = not tested)  LOWER EXTREMITY MMT:    MMT Right eval Left eval  Hip flexion 4 4+  Hip extension 3+ 4  Hip abduction    Hip adduction    Hip internal rotation    Hip external rotation    Knee flexion 4 4  Knee extension 4 5  Ankle dorsiflexion 4+ 5  Ankle plantarflexion    Ankle inversion    Ankle eversion     (Blank rows = not tested)  FUNCTIONAL TESTS:  5 times sit to stand: 23.73 sec hands on thighs  GAIT: Distance walked: 60 ft Assistive device utilized: None Level of assistance: Modified independence Comments: slight antalgic gait  TREATMENT DATE:                                    11/08/24 EXERCISE LOG  Exercise Repetitions and Resistance Comments  Bridge  15 reps w/ 5 second hold    SLR  15 reps each    Double knee to chest  2 minutes    Resisted pull down   GTB x 25 reps    Pallof press with overhead reach  GTB x 20 reps each   Standing ball roll out   2 minutes  Multidirectional   Sit to stand  20 reps  With tidal tank at chest   Tandem stance on foam   3 x 30 seconds each    Standing heel raise  20 reps    Standing hip extension  15 reps each     Blank cell = exercise not performed today   11/05/24:  Prone position with MHP - POE x 2 min - Press up 5x 10 - Heel squeeze 10x 5 -glut squeeze 10  - Hip extension 10 Quadriped:  cat/horse x 5  Supine:            -knee to chest 30 x 3    - Bridge 10x 5   - Hamstring stretch 2x 30 With nerve flossing  Sitting- Piriformis stretch  2x 30             Sit to stand x10  Standing back extension  x 10                 Heel raises x 10                Squat x 10                      PATIENT EDUCATION:  Education details: Patient educated on exam findings, POC, scope of PT, HEP, and what to expect next visit. Person educated: Patient Education method: Explanation, Demonstration, and Handouts Education comprehension: verbalized understanding, returned demonstration, verbal cues required, and tactile cues required  HOME EXERCISE PROGRAM: 11/03/24: Access Code: [email or web address] URL: [email or web address] Date: 11/03/2024 Prepared by: Augustin Mclean  Exercises - Prone Press Up On Elbows  - 2 x daily - 7 x weekly - 1 sets - 1 reps - 2' hold - Prone Press Up  - 1 x daily - 7 x weekly - 1 sets - 5 reps - 10 hold - Supine Bridge  - 2 x  daily - 7 x weekly - 2 sets - 10 reps - 5 hold - Supine Figure 4 Piriformis Stretch  - 2 x daily - 7 x weekly - 2 sets - 3 reps - 30 hold  Access Code: [email or web address] Date: 10/29/2024 Exercises - Prone Gluteal Sets  - 1 x daily - 7 x weekly - 1 sets - 10 reps - 5 sec hold - Prone Knee Flexion  - 1 x daily - 7 x weekly - 2 sets - 10 reps  Access Code: [email or web address] URL: [email or web address] Date: 10/13/2024 Prepared by: [facility] - Rehab  Exercises - Lying Prone  - 1 x daily - 7 x weekly - 1 sets - 10 reps - Standing Lumbar Extension with Counter  - 1 x daily - 7 x weekly - 1 sets - 10 reps  ASSESSMENT:  CLINICAL IMPRESSION: Patient was introduced to multiple new interventions for improved core and lumbar stability. He required minimal cueing with resisted pallof presses to maintain lumbar stability. He experienced no increase in pain or discomfort with any of today's interventions. He reported that his back was stiff, but not hurting upon the conclusion of treatment. Patient continues to require skilled physical therapy to address her remaining impairments to return to her prior level of function.      Eval:Patient is a 49 y.o. male who  was seen today for physical therapy evaluation and treatment for  M54.16 (ICD-10-CM) - Lumbar radiculopathy  M47.816 (ICD-10-CM) - Lumbar spondylosis  M51.362 (ICD-10-CM) - Degeneration of intervertebral disc of lumbar region with discogenic back pain and lower extremity pain  Patient demonstrates muscle weakness, reduced ROM, and fascial restrictions which are likely contributing to symptoms of pain and are negatively impacting patient ability to perform ADLs and functional mobility tasks. Patient will benefit from skilled physical therapy services to address these deficits to reduce pain and improve level of function with ADLs and functional mobility tasks.   OBJECTIVE IMPAIRMENTS: Abnormal gait, decreased activity tolerance, difficulty walking, decreased strength, and pain.   ACTIVITY LIMITATIONS: carrying, lifting, bending, sitting, standing, squatting, sleeping, stairs, transfers, bed mobility, and locomotion level  PARTICIPATION LIMITATIONS: meal prep, cleaning, laundry, driving, shopping, and community activity  EHAB POTENTIAL: Good  CLINICAL DECISION MAKING: Evolving/moderate complexity  EVALUATION COMPLEXITY: Moderate   GOALS: Goals reviewed with patient? No  SHORT TERM GOALS: Target date: 10/27/2024  patient will be independent with initial HEP and compliant with HEP 3-4 times a week   Baseline: Goal status: in progress  2.  Patient will report 50% improvement overall  Baseline:  Goal status: in progress  LONG TERM GOALS: Target date: 11/10/2024  Patient will be independent in self management strategies to improve quality of life and functional outcomes.  Baseline:  Goal status: in progress  2.  Patient will report 70% improvement overall  Baseline:  Goal status: in progress  3.  Patient will improve 5 times sit to stand score to 15 sec or less to demonstrate improved functional mobility and increased leg strength.    Baseline: 23.73 sec Goal status: in  progress  4.   Patient will increase right leg MMT's to 4+ to 5/5 to allow navigation of steps without gait deviation or loss of balance  Baseline: see above Goal status: in progress  5.  Patient will improve Modified Oswestry score by 8 points to demonstrate improved perceived function  Baseline: 28/50 Goal status: in progress  PLAN:  PT FREQUENCY: 2x/week  PT DURATION: 4 weeks  PLANNED INTERVENTIONS: 02835-  PT Re-evaluation, 97110-Therapeutic exercises, 97530- Therapeutic activity, [id number]- Neuromuscular re-education, [phone]- Self Care, 02859- Manual therapy, [phone]- Gait training, [phone]- Orthotic Fit/training, [phone]- Canalith repositioning, [id number]- Aquatic Therapy, 97760- Splinting, 97597- Wound care (first 20 sq cm), 97598- Wound care (each additional 20 sq cm)Patient/Family education, Balance training, Stair training, Taping, Dry Needling, Joint mobilization, Joint manipulation, Spinal manipulation, Spinal mobilization, Scar mobilization, and DME instructions. SABRA  PLAN FOR NEXT SESSION: lumbar mobility; extension first then core strengthening. Add Theraband postural exercises.  Lacinda Fass, PT, DPT  5:32 PM, 11/08/2024    "

## 2024-11-09 ENCOUNTER — Ambulatory Visit (HOSPITAL_COMMUNITY)

## 2024-11-09 NOTE — Progress Notes (Unsigned)
 "  Chief Complaint: Positive Cologuard  HPI:    Randall Meadows is a 49 year old African-American male with past medical history as listed below including congenital hydrocephaly, depression and multiple others, who was referred to me by Billy Philippe SAUNDERS, NP for a complaint of positive Cologuard.      03/05/2021 echo with LVEF 65-70% and grade 1 diastolic dysfunction.  Otherwise normal.    09/11/24 positive Cologuard.    09/27/2024 CMP with a potassium minimally decreased at 3.4 otherwise normal, TSH and free T4 and T3 normal.  Past Medical History:  Diagnosis Date   Anxiety    Asthma    Congenital hydrocephaly (HCC)    Depression    Hypertension    Memory impairment    Sciatica     Past Surgical History:  Procedure Laterality Date   BRAIN SURGERY     Shunt from Hydrocephaly   TONSILLECTOMY      Current Outpatient Medications  Medication Sig Dispense Refill   albuterol  (VENTOLIN  HFA) 108 (90 Base) MCG/ACT inhaler Inhale 1-2 puffs into the lungs every 4 (four) hours as needed for wheezing or shortness of breath (cough, shortness of breath or wheezing.). 18 g 1   benzonatate  (TESSALON ) 100 MG capsule Take 1 capsule (100 mg total) by mouth every 8 (eight) hours. 30 capsule 0   budesonide -formoterol  (SYMBICORT ) 160-4.5 MCG/ACT inhaler Inhale 2 puffs into the lungs daily. 10.2 g 5   cetirizine  (ZYRTEC ) 10 MG tablet Take 1 tablet (10 mg total) by mouth daily as needed for allergies. 30 tablet 5   Cholecalciferol (VITAMIN D3) 1.25 MG (50000 UT) CAPS Take 1 capsule (1.25 mg total) by mouth every 7 (seven) days. 12 capsule 0   donepezil (ARICEPT) 5 MG tablet Take 5 mg by mouth daily.     escitalopram  (LEXAPRO ) 10 MG tablet Take 1 tablet (10 mg total) by mouth daily. 90 tablet 0   fluticasone  (FLONASE ) 50 MCG/ACT nasal spray Place 2 sprays into both nostrils daily as needed. 16 g 5   gabapentin  (NEURONTIN ) 100 MG capsule TAKE 1 CAPSULE BY MOUTH THREE TIMES DAILY 90 capsule 0   hydrOXYzine   (ATARAX ) 50 MG tablet Take 1 tablet (50 mg total) by mouth at bedtime. 90 tablet 1   lisinopril-hydrochlorothiazide (ZESTORETIC) 20-25 MG tablet Take 1 tablet by mouth daily.     metoprolol  succinate (TOPROL  XL) 25 MG 24 hr tablet Take 1 tablet (25 mg total) by mouth daily. 90 tablet 3   potassium chloride  (MICRO-K ) 10 MEQ CR capsule Take 1 capsule (10 mEq total) by mouth daily. 7 capsule 0   rosuvastatin  (CRESTOR ) 5 MG tablet Take 1 tablet (5 mg total) by mouth daily. 90 tablet 3   triamcinolone  cream (KENALOG ) 0.1 % Apply a thin layer to the affected areas twice daily. DO NOT use on the face. 454 g 0   vitamin B-12 (CYANOCOBALAMIN ) 100 MCG tablet Take 100 mcg by mouth daily.     No current facility-administered medications for this visit.    Allergies as of 11/11/2024 - Review Complete 11/08/2024  Allergen Reaction Noted   Grass pollen([id number]-o-r-t-swt vern) Itching 07/03/2023    Family History  Problem Relation Age of Onset   Hypertension Mother    Heart disease Mother    Diabetes Mother    Depression Son    Anxiety disorder Son    Allergic rhinitis Son    Depression Son    Anxiety disorder Son    Allergic rhinitis Son    Bipolar  disorder Maternal Aunt    Schizophrenia Maternal Aunt    Urticaria Maternal Grandmother    Food Allergy Maternal Grandmother    Heart disease Maternal Grandmother    Hypertension Maternal Grandfather     Social History   Socioeconomic History   Marital status: Married    Spouse name: Tawni   Number of children: 2   Years of education: Not on file   Highest education level: Master's degree (e.g., MA, MS, MEng, MEd, MSW, MBA)  Occupational History   Occupation: disabled  Tobacco Use   Smoking status: Never   Smokeless tobacco: Never  Vaping Use   Vaping status: Never Used  Substance and Sexual Activity   Alcohol use: Never   Drug use: Never   Sexual activity: Yes  Other Topics Concern   Not on file  Social History Narrative   Lives  with wife and 2 children and 2 dogs/2025   Social Drivers of Health   Tobacco Use: Low Risk (11/08/2024)   Patient History    Smoking Tobacco Use: Never    Smokeless Tobacco Use: Never    Passive Exposure: Not on file  Financial Resource Strain: Low Risk  (11/02/2024)   Received from [facility]   Overall Financial Resource Strain (CARDIA)    Difficulty of Paying Living Expenses: Not very hard  Food Insecurity: No Food Insecurity (11/02/2024)   Received from [facility]   Epic    Within the past 12 months, you worried that your food would run out before you got the money to buy more.: Never true    Within the past 12 months, the food you bought just didn't last and you didn't have money to get more.: Never true  Recent Concern: Food Insecurity - Food Insecurity Present (09/03/2024)   Epic    Worried About Programme Researcher, Broadcasting/film/video in the Last Year: Sometimes true    [employer] of Food in the Last Year: Often true  Transportation Needs: No Transportation Needs (11/02/2024)   Received from [facility]   PRAPARE - Transportation    In the past 12 months, has lack of transportation kept you from medical appointments or from getting medications?: No    Lack of Transportation (Non-Medical): No  Physical Activity: Insufficiently Active (09/03/2024)   Exercise Vital Sign    Days of Exercise per Week: 3 days    Minutes of Exercise per Session: 10 min  Stress: Stress Concern Present (09/03/2024)   [employer] of Occupational Health - Occupational Stress Questionnaire    Feeling of Stress: Very much  Social Connections: Moderately Integrated (09/03/2024)   Social Connection and Isolation Panel    Frequency of Communication with Friends and Family: Never    Frequency of Social Gatherings with Friends and Family: Never    Attends Religious Services: More than 4 times per year    Active Member of Clubs or Organizations: Yes    Attends Tax Inspector Meetings: More than 4 times per year    Marital Status: Married  Catering Manager Violence: Not At Risk (09/06/2024)   Epic    Fear of Current or Ex-Partner: No    Emotionally Abused: No    Physically Abused: No    Sexually Abused: No  Depression (PHQ2-9): High Risk (09/27/2024)   Depression (PHQ2-9)    PHQ-2 Score: 15  Alcohol Screen: Low Risk (08/30/2024)   Alcohol Screen    Last Alcohol Screening Score (AUDIT): 0  Housing: Low Risk  (11/02/2024)   Received from [facility]   Epic    In the last 12 months, was there a time when you were not able to pay the mortgage or rent on time?: No    In the past 12 months, how many times have you moved where you were living?: 0    At any time in the past 12 months, were you homeless or living in a shelter (including now)?: No  Utilities: Not At Risk (11/02/2024)   Received from [facility]   Epic    In the past 12 months has the electric, gas, oil, or water company threatened to shut off services in your home?: No  Health Literacy: Adequate Health Literacy (09/06/2024)   B1300 Health Literacy    Frequency of need for help with medical instructions: Rarely    Review of Systems:    Constitutional: No weight loss, fever, chills, weakness or fatigue HEENT: Eyes: No change in vision               Ears, Nose, Throat:  No change in hearing or congestion Skin: No rash or itching Cardiovascular: No chest pain, chest pressure or palpitations   Respiratory: No SOB or cough Gastrointestinal: See HPI and otherwise negative Genitourinary: No dysuria or change in urinary frequency Neurological: No headache, dizziness or syncope Musculoskeletal: No new muscle or joint pain Hematologic: No bleeding or bruising Psychiatric: No history of depression or anxiety    Physical Exam:  Vital signs: There were no vitals taken for this visit.  Constitutional:   Pleasant Caucasian male appears to be in NAD, Well  developed, Well nourished, alert and cooperative Head:  Normocephalic and atraumatic. Eyes:   PEERL, EOMI. No icterus. Conjunctiva pink. Ears:  Normal auditory acuity. Neck:  Supple Throat: Oral cavity and pharynx without inflammation, swelling or lesion.  Respiratory: Respirations even and unlabored. Lungs clear to auscultation bilaterally.   No wheezes, crackles, or rhonchi.  Cardiovascular: Normal S1, S2. No MRG. Regular rate and rhythm. No peripheral edema, cyanosis or pallor.  Gastrointestinal:  Soft, nondistended, nontender. No rebound or guarding. Normal bowel sounds. No appreciable masses or hepatomegaly. Rectal:  Not performed.  Msk:  Symmetrical without gross deformities. Without edema, no deformity or joint abnormality.  Neurologic:  Alert and  oriented x4;  grossly normal neurologically.  Skin:   Dry and intact without significant lesions or rashes. Psychiatric: Oriented to person, place and time. Demonstrates good judgement and reason without abnormal affect or behaviors.  RELEVANT LABS AND IMAGING: CBC    Component Value Date/Time   WBC 8.1 09/05/2024 1909   RBC 4.90 09/05/2024 1909   HGB 14.3 09/05/2024 1909   HCT 42.8 09/05/2024 1909   PLT 225 09/05/2024 1909   MCV 87.3 09/05/2024 1909   MCH 29.2 09/05/2024 1909   MCHC 33.4 09/05/2024 1909   RDW 13.6 09/05/2024 1909   LYMPHSABS 4.3 (H) 09/05/2024 1909   MONOABS 0.6 09/05/2024 1909   EOSABS 0.1 09/05/2024 1909   BASOSABS 0.1 09/05/2024 1909    CMP     Component Value Date/Time   NA 140 10/14/2024 1040   K 3.7 10/14/2024 1040   CL 100 10/14/2024 1040   CO2 31 10/14/2024 1040   GLUCOSE 87 10/14/2024 1040   BUN 16 10/14/2024 1040   CREATININE 1.13 10/14/2024 1040   CALCIUM  9.2 10/14/2024 1040   PROT 7.3 10/14/2024 1040   ALBUMIN 4.2 10/14/2024 1040  AST 21 10/14/2024 1040   ALT 18 10/14/2024 1040   ALKPHOS 60 10/14/2024 1040   BILITOT 0.6 10/14/2024 1040   GFRNONAA >60 09/05/2024 1909     Assessment: 1.  Positive Cologuard:  Plan: 1. ***     Randall Failing, PA-C [facility] 11/09/2024, 1:06 PM  Cc: Billy Philippe SAUNDERS, NP  "

## 2024-11-11 ENCOUNTER — Encounter: Payer: Self-pay | Admitting: Physician Assistant

## 2024-11-11 ENCOUNTER — Ambulatory Visit (INDEPENDENT_AMBULATORY_CARE_PROVIDER_SITE_OTHER): Admitting: Physician Assistant

## 2024-11-11 VITALS — BP 130/86 | HR 62 | Ht 69.0 in | Wt 259.0 lb

## 2024-11-11 DIAGNOSIS — Z800 Family history of malignant neoplasm of digestive organs: Secondary | ICD-10-CM | POA: Diagnosis not present

## 2024-11-11 DIAGNOSIS — R195 Other fecal abnormalities: Secondary | ICD-10-CM

## 2024-11-11 MED ORDER — NA SULFATE-K SULFATE-MG SULF 17.5-3.13-1.6 GM/177ML PO SOLN: 1.0000 | Freq: Once | ORAL | 0 refills | Status: AC

## 2024-11-11 NOTE — Patient Instructions (Signed)

## 2024-11-12 ENCOUNTER — Encounter (HOSPITAL_COMMUNITY): Payer: Self-pay

## 2024-11-12 ENCOUNTER — Ambulatory Visit (HOSPITAL_COMMUNITY)

## 2024-11-12 DIAGNOSIS — R29898 Other symptoms and signs involving the musculoskeletal system: Secondary | ICD-10-CM

## 2024-11-12 DIAGNOSIS — M6281 Muscle weakness (generalized): Secondary | ICD-10-CM

## 2024-11-12 DIAGNOSIS — M545 Low back pain, unspecified: Secondary | ICD-10-CM | POA: Diagnosis not present

## 2024-11-12 DIAGNOSIS — M5416 Radiculopathy, lumbar region: Secondary | ICD-10-CM

## 2024-11-12 NOTE — Therapy (Addendum)
 " OUTPATIENT PHYSICAL THERAPY THORACOLUMBAR TREATMENT   Patient Name: Randall Meadows MRN: [id number] DOB:[birth year], 49 y.o., male Today's Date: 11/12/2024  END OF SESSION:  PT End of Session - 11/12/24 1117     Visit Number 6    Number of Visits 8    Date for Recertification  11/10/24    Authorization Type [employer]ARE    Authorization Time Period no auth needed    PT Start Time 1030    PT Stop Time 1112    PT Time Calculation (min) 42 min    Activity Tolerance Patient tolerated treatment well    Behavior During Therapy WFL for tasks assessed/performed             Past Medical History:  Diagnosis Date   Allergy    Anxiety    Arthritis    Asthma    Congenital hydrocephaly (HCC)    Depression    Hypertension    Memory impairment    Sciatica    Past Surgical History:  Procedure Laterality Date   BRAIN SURGERY     Shunt from Hydrocephaly   TONSILLECTOMY     Patient Active Problem List   Diagnosis Date Noted   Current severe episode of major depressive disorder without psychotic features (HCC) 09/27/2024   Prediabetes 09/27/2024   Vitamin D  deficiency 09/27/2024   Mixed hyperlipidemia 09/27/2024   Mild persistent asthma, uncomplicated 10/04/2022   Seasonal allergic rhinitis due to pollen 10/04/2022   Seasonal allergic conjunctivitis 10/04/2022   Anxiety state 04/03/2009   OBESITY 09/07/2007   Essential hypertension 09/07/2007   Allergic rhinitis 09/07/2007   Headache 09/07/2007   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/2007    PCP: Billy Philippe SAUNDERS, NP  REFERRING PROVIDER: Hilma Hastings, PA-C  REFERRING DIAG:  M54.16 (ICD-10-CM) - Lumbar radiculopathy  M47.816 (ICD-10-CM) - Lumbar spondylosis  M51.362 (ICD-10-CM) - Degeneration of intervertebral disc of lumbar region with discogenic back pain and lower extremity pain    Rationale for Evaluation and Treatment: Rehabilitation  THERAPY DIAG:   Low back pain, unspecified back pain laterality, unspecified chronicity, unspecified whether sciatica present  Radiculopathy, lumbar region  Muscle weakness (generalized)  Other symptoms and signs involving the musculoskeletal system  ONSET DATE: chronic  SUBJECTIVE:                                                                                                                                                                                           SUBJECTIVE STATEMENT: Pt is having 10/10 pain at the start of today's session in his back. He reports not being  able to sleep last night because of pain in his lower back radiating down to his leg to his knee; numbness, tingling, and achy. Pt was unable to complete exercises yesterday due to having a doctors appointment. He reports exercises at home are beginning to feel easy.   EVAL:Patient well known to this clinic.  Low back pain for years but seems to be worse lately.  Pain radiates down into right leg and sometimes the left.  New x-ray and MRI showed disc bulge and arthritis.  Has a hard time with prolonged sitting, standing, walking and driving.  Scheduled for an injection next month  PERTINENT HISTORY:    PAIN:  Are you having pain? Yes: NPRS scale: 4/10 Pain location: low back and down legs Pain description: aching and burning and numbness Aggravating factors: prolonged position Relieving factors: heat and back aid  PRECAUTIONS: None  RED FLAGS: None   WEIGHT BEARING RESTRICTIONS: No  FALLS:  Has patient fallen in last 6 months? No   OCCUPATION: disability  PLOF: Independent  PATIENT GOALS: get back to 100%  NEXT MD VISIT: next month consultation about injection  OBJECTIVE:  Note: Objective measures were completed at Evaluation unless otherwise noted.  DIAGNOSTIC FINDINGS:  IMPRESSION: 1. Small right foraminal disc protrusion at L4-5, potentially affecting the right L4 nerve root. 2. Left foraminal disc  protrusion at L3-4, contacting and potentially affecting the exiting left L3 nerve root. 3. Mild to moderate bilateral L3 through L5 foraminal stenosis related disc bulge and facet hypertrophy. 4. Mild reactive marrow edema about the left L4-5 facet due to facet arthritis. Finding could serve as a source for lower back pain.     Electronically Signed   By: Morene Hoard M.D.   On: 08/30/2024 05:27  PATIENT SURVEYS:  11/12/24: 15/50; 30%  Modified Oswestry:  MODIFIED OSWESTRY DISABILITY SCALE  Date: 10/13/2024 Score                                Total 28/50; 56%   Interpretation of scores: Score Category Description  0-20% Minimal Disability The patient can cope with most living activities. Usually no treatment is indicated apart from advice on lifting, sitting and exercise  21-40% Moderate Disability The patient experiences more pain and difficulty with sitting, lifting and standing. Travel and social life are more difficult and they may be disabled from work. Personal care, sexual activity and sleeping are not grossly affected, and the patient can usually be managed by conservative means  41-60% Severe Disability Pain remains the main problem in this group, but activities of daily living are affected. These patients require a detailed investigation  61-80% Crippled Back pain impinges on all aspects of the patients life. Positive intervention is required  81-100% Bed-bound These patients are either bed-bound or exaggerating their symptoms  Bluford FORBES Zoe DELENA Karon DELENA, et al. Surgery versus conservative management of stable thoracolumbar fracture: the PRESTO feasibility RCT. [place] ([place]): [employer]; 2021 Nov. [facility] Technology Assessment, No. 25.62.) Appendix 3, Oswestry Disability Index category descriptors. Available from: Findjewelers.cz  Minimally Clinically Important Difference (MCID) = 12.8%  COGNITION: Overall  cognitive status: Within functional limits for tasks assessed     SENSATION: WFL; but reports numb sensation  down right leg  MUSCLE LENGTH: Hamstrings: check next visit  POSTURE: rounded shoulders, forward head, and increased lumbar lordosis  PALPATION: Tender L2-L5 paraspinals  LUMBAR ROM:   AROM eval  Flexion Full fingers to toes  Extension 40% available pain  Right lateral flexion   Left lateral flexion   Right rotation   Left rotation    (Blank rows = not tested)  LOWER EXTREMITY ROM:     Active  Right eval Left eval  Hip flexion    Hip extension    Hip abduction    Hip adduction    Hip internal rotation    Hip external rotation    Knee flexion    Knee extension    Ankle dorsiflexion    Ankle plantarflexion    Ankle inversion    Ankle eversion     (Blank rows = not tested)  LOWER EXTREMITY MMT:    MMT Right eval Left eval Right 11/12/24 Left 11/12/24  Hip flexion 4 4+ 4+ 5  Hip extension 3+ 4 4+ 5  Hip abduction      Hip adduction      Hip internal rotation      Hip external rotation      Knee flexion 4 4 5 5   Knee extension 4 5 5 5   Ankle dorsiflexion 4+ 5 5 5   Ankle plantarflexion      Ankle inversion      Ankle eversion       (Blank rows = not tested)  FUNCTIONAL TESTS:     11/12/24: 5 times sit to stand: 20.28 sec hands on thighs              Eval: 5 times sit to stand: 23.73 sec hands on thighs    GAIT: Distance walked: 60 ft Assistive device utilized: None Level of assistance: Modified independence Comments: slight antalgic gait  TREATMENT DATE:                                      11/12/24 EXERCISE LOG  Exercise Repetitions and Resistance Comments  Goal review  All goals met with exception of sit to stand time improvement.  Review and update HEP  Pt reported exercises were feeling too easy, added exercises and sent home with a green theraband.  Banded glute bridge 15 reps with green theraband   Banded hip extension 2 x 10  reps each leg Moderate cueing for core activation and posture.  Standing marches 15 reps each side Moderate cueing for core activation and eccentric control   Blank cell = exercise not performed today                                    11/08/24 EXERCISE LOG  Exercise Repetitions and Resistance Comments  Bridge  15 reps w/ 5 second hold    SLR  15 reps each    Double knee to chest  2 minutes    Resisted pull down   GTB x 25 reps    Pallof press with overhead reach  GTB x 20 reps each   Standing ball roll out   2 minutes  Multidirectional   Sit to stand  20 reps  With tidal tank at chest   Tandem stance on foam   3 x 30 seconds each    Standing heel raise  20 reps    Standing hip extension  15 reps each     Blank cell = exercise not performed today   11/05/24:  Prone position  with MHP - POE x 2 min - Press up 5x 10 - Heel squeeze 10x 5 -glut squeeze 10  - Hip extension 10 Quadriped:  cat/horse x 5  Supine:            -knee to chest 30 x 3    - Bridge 10x 5   - Hamstring stretch 2x 30 With nerve flossing  Sitting- Piriformis stretch  2x 30             Sit to stand x10  Standing back extension x 10                 Heel raises x 10                Squat x 10                      PATIENT EDUCATION:  Education details: Patient educated on exam findings, POC, scope of PT, HEP, and what to expect next visit. Person educated: Patient Education method: Explanation, Demonstration, and Handouts Education comprehension: verbalized understanding, returned demonstration, verbal cues required, and tactile cues required  HOME EXERCISE PROGRAM: 11/12/24: Access Code: [email or web address] URL: [email or web address]  Date: 11/12/2024  Prepared by: Lacinda Fass  Exercises -  Supine Bridge with Resistance Band  - 1-2 x daily - 7 x weekly - 3 sets - 10 reps  - Standing Hip Extension with Resistance at Ankles and Counter Support  - 1-2 x daily - 7 x weekly - 3 sets - 10 reps  -  Standing Marching  - 1-2 x daily - 7 x weekly - 3 sets - 10 reps   11/03/24: Access Code: [email or web address] URL: [email or web address] Date: 11/03/2024 Prepared by: Augustin Mclean  Exercises - Prone Press Up On Elbows  - 2 x daily - 7 x weekly - 1 sets - 1 reps - 2' hold - Prone Press Up  - 1 x daily - 7 x weekly - 1 sets - 5 reps - 10 hold - Supine Bridge  - 2 x daily - 7 x weekly - 2 sets - 10 reps - 5 hold - Supine Figure 4 Piriformis Stretch  - 2 x daily - 7 x weekly - 2 sets - 3 reps - 30 hold  Access Code: [email or web address] Date: 10/29/2024 Exercises - Prone Gluteal Sets  - 1 x daily - 7 x weekly - 1 sets - 10 reps - 5 sec hold - Prone Knee Flexion  - 1 x daily - 7 x weekly - 2 sets - 10 reps  Access Code: [email or web address] URL: [email or web address] Date: 10/13/2024 Prepared by: [facility]  Exercises - Lying Prone  - 1 x daily - 7 x weekly - 1 sets - 10 reps - Standing Lumbar Extension with Counter  - 1 x daily - 7 x weekly - 1 sets - 10 reps  ASSESSMENT:  CLINICAL IMPRESSION: Pt continues to show a progression in LE strength and a decrease in back and LE pain with activity. He met all short term goals and all long term goals, with the exception of the 5 time sit to stand time goal. Pt completed the 5 time sit to stand in 20.28 seconds which is an improvement compared to the 23.73 seconds he required at his initial evaluation. MMT was performed to assess strength. Pt demonstrated increased global strength in both LE, but still exhibits weakness in right hip flexion and extension. Pt was given new home exercise program  to increase difficulty of current HEP. He reports pain had reduced to 4/10 pain by the end of today's session. Pt will benefit from continuing current plan of care to address lumbar pain, LE strength deficits, and functional limitations.   Eval:Patient is a 49 y.o. male who was seen today for physical therapy evaluation and treatment for  M54.16 (ICD-10-CM)  - Lumbar radiculopathy  M47.816 (ICD-10-CM) - Lumbar spondylosis  M51.362 (ICD-10-CM) - Degeneration of intervertebral disc of lumbar region with discogenic back pain and lower extremity pain  Patient demonstrates muscle weakness, reduced ROM, and fascial restrictions which are likely contributing to symptoms of pain and are negatively impacting patient ability to perform ADLs and functional mobility tasks. Patient will benefit from skilled physical therapy services to address these deficits to reduce pain and improve level of function with ADLs and functional mobility tasks.   OBJECTIVE IMPAIRMENTS: Abnormal gait, decreased activity tolerance, difficulty walking, decreased strength, and pain.   ACTIVITY LIMITATIONS: carrying, lifting, bending, sitting, standing, squatting, sleeping, stairs, transfers, bed mobility, and locomotion level  PARTICIPATION LIMITATIONS: meal prep, cleaning, laundry, driving, shopping, and community activity  EHAB POTENTIAL: Good  CLINICAL DECISION MAKING: Evolving/moderate complexity  EVALUATION COMPLEXITY: Moderate   GOALS: Goals reviewed with patient? No  SHORT TERM GOALS: Target date: 10/27/2024  patient will be independent with initial HEP and compliant with HEP 3-4 times a week   Baseline: Everyday Goal status: MET  2.  Patient will report 50% improvement overall  Baseline: 70% Goal status: MET  LONG TERM GOALS: Target date: 11/10/2024  Patient will be independent in self management strategies to improve quality of life and functional outcomes.  Baseline:  Goal status: MET  2.  Patient will report 70% improvement overall  Baseline: 70% (11/12/24) Goal status: MET  3.  Patient will improve 5 times sit to stand score to 15 sec or less to demonstrate improved functional mobility and increased leg strength.    Baseline: 23.73 sec 20.28 (11/12/24) Goal status: IN PROGRESS  4.   Patient will increase right leg MMT's to 4+ to 5/5 to allow  navigation of steps without gait deviation or loss of balance  Baseline: see above Goal status: MET  5.  Patient will improve Modified Oswestry score by 8 points to demonstrate improved perceived function  Baseline: 28/50 15/50 (11/12/24) Goal status: MET  PLAN:  PT FREQUENCY: 2x/week  PT DURATION: 4 weeks  PLANNED INTERVENTIONS: 97164- PT Re-evaluation, 97110-Therapeutic exercises, 97530- Therapeutic activity, 97112- Neuromuscular re-education, 97535- Self Care, [id number]- Manual therapy, [id number]- Gait training, [phone]- Orthotic Fit/training, [phone]- Canalith repositioning, [id number]- Aquatic Therapy, 97760- Splinting, [phone]- Wound care (first 20 sq cm), 97598- Wound care (each additional 20 sq cm)Patient/Family education, Balance training, Stair training, Taping, Dry Needling, Joint mobilization, Joint manipulation, Spinal manipulation, Spinal mobilization, Scar mobilization, and DME instructions. SABRA  PLAN FOR NEXT SESSION: Continue to progress sit to stands to improve 5 time sit to stand goal. Continue to strengthen hip flexion and extension.  Venus Galeazzi, SPT  11:18 AM, 11/12/24  Today's visit was completed with direct 1:1 supervision and direction by Lacinda Fass, PT, DPT    "

## 2024-11-16 ENCOUNTER — Ambulatory Visit (HOSPITAL_COMMUNITY)

## 2024-11-17 NOTE — Progress Notes (Signed)
 ____________________________________________________________  Attending physician addendum:  Thank you for sending this case to me. I have reviewed the entire note and agree with the plan.   Victory Brand, MD  ____________________________________________________________

## 2024-11-18 ENCOUNTER — Ambulatory Visit (HOSPITAL_COMMUNITY)

## 2024-11-18 ENCOUNTER — Encounter (HOSPITAL_COMMUNITY): Payer: Self-pay

## 2024-11-18 DIAGNOSIS — M545 Low back pain, unspecified: Secondary | ICD-10-CM

## 2024-11-18 DIAGNOSIS — R29898 Other symptoms and signs involving the musculoskeletal system: Secondary | ICD-10-CM

## 2024-11-18 DIAGNOSIS — M5416 Radiculopathy, lumbar region: Secondary | ICD-10-CM

## 2024-11-18 DIAGNOSIS — M6281 Muscle weakness (generalized): Secondary | ICD-10-CM

## 2024-11-18 NOTE — Therapy (Addendum)
 " OUTPATIENT PHYSICAL THERAPY THORACOLUMBAR TREATMENT   Patient Name: Randall Meadows MRN: [id number] DOB:06/12/1976, 49 y.o., male Today's Date: 11/18/2024  END OF SESSION:  PT End of Session - 11/18/24 0943     Visit Number 7    Number of Visits 8    Date for Recertification  11/10/24    Authorization Type [employer]    Authorization Time Period no auth needed    PT Start Time 0945           11/18/24 0943  PT Visits / Re-Eval  Visit Number 7  Number of Visits 8  Date for Recertification  12/10/24  Authorization  Authorization Type [employer]UE MEDIC[employer]  Authorization Time Period no auth needed  PT Time Calculation  PT Start Time 0945  PT Stop Time 1026  PT Time Calculation (min) 41 min  PT - End of Session  Activity Tolerance Patient tolerated treatment well  Behavior During Therapy WFL for tasks assessed/performed      Past Medical History:  Diagnosis Date   Allergy    Anxiety    Arthritis    Asthma    Congenital hydrocephaly (HCC)    Depression    Hypertension    Memory impairment    Sciatica    Past Surgical History:  Procedure Laterality Date   BRAIN SURGERY     Shunt from Hydrocephaly   TONSILLECTOMY     Patient Active Problem List   Diagnosis Date Noted   Current severe episode of major depressive disorder without psychotic features (HCC) 09/27/2024   Prediabetes 09/27/2024   Vitamin D  deficiency 09/27/2024   Mixed hyperlipidemia 09/27/2024   Mild persistent asthma, uncomplicated 10/04/2022   Seasonal allergic rhinitis due to pollen 10/04/2022   Seasonal allergic conjunctivitis 10/04/2022   Anxiety state 04/03/2009   OBESITY 09/07/2007   Essential hypertension 09/07/2007   Allergic rhinitis 09/07/2007   Headache 09/07/2007   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/2007    PCP: Billy Philippe SAUNDERS, NP  REFERRING PROVIDER: Hilma Hastings,  PA-C  REFERRING DIAG:  M54.16 (ICD-10-CM) - Lumbar radiculopathy  M47.816 (ICD-10-CM) - Lumbar spondylosis  M51.362 (ICD-10-CM) - Degeneration of intervertebral disc of lumbar region with discogenic back pain and lower extremity pain    Rationale for Evaluation and Treatment: Rehabilitation  THERAPY DIAG:  Low back pain, unspecified back pain laterality, unspecified chronicity, unspecified whether sciatica present  Radiculopathy, lumbar region  Muscle weakness (generalized)  Other symptoms and signs involving the musculoskeletal system  ONSET DATE: chronic  SUBJECTIVE:  SUBJECTIVE STATEMENT: Pt stated he is coming along, feels better than last session.  Pain scale 5/10 on Lt/Rt sides of lower back and Lt knee sore, achy, and dull pain.    EVAL:Patient well known to this clinic.  Low back pain for years but seems to be worse lately.  Pain radiates down into right leg and sometimes the left.  New x-ray and MRI showed disc bulge and arthritis.  Has a hard time with prolonged sitting, standing, walking and driving.  Scheduled for an injection next month  PERTINENT HISTORY:    PAIN:  Are you having pain? Yes: NPRS scale: 5/10 Pain location: low back and down legs Pain description: aching and burning and numbness Aggravating factors: prolonged position Relieving factors: heat and back aid  PRECAUTIONS: None  RED FLAGS: None   WEIGHT BEARING RESTRICTIONS: No  FALLS:  Has patient fallen in last 6 months? No   OCCUPATION: disability  PLOF: Independent  PATIENT GOALS: get back to 100%  NEXT MD VISIT: next month consultation about injection  OBJECTIVE:  Note: Objective measures were completed at Evaluation unless otherwise noted.  DIAGNOSTIC FINDINGS:  IMPRESSION: 1. Small right  foraminal disc protrusion at L4-5, potentially affecting the right L4 nerve root. 2. Left foraminal disc protrusion at L3-4, contacting and potentially affecting the exiting left L3 nerve root. 3. Mild to moderate bilateral L3 through L5 foraminal stenosis related disc bulge and facet hypertrophy. 4. Mild reactive marrow edema about the left L4-5 facet due to facet arthritis. Finding could serve as a source for lower back pain.     Electronically Signed   By: Morene Hoard M.D.   On: 08/30/2024 05:27  PATIENT SURVEYS:  11/12/24: 15/50; 30%  Modified Oswestry:  MODIFIED OSWESTRY DISABILITY SCALE  Date: 10/13/2024 Score                                Total 28/50; 56%   Interpretation of scores: Score Category Description  0-20% Minimal Disability The patient can cope with most living activities. Usually no treatment is indicated apart from advice on lifting, sitting and exercise  21-40% Moderate Disability The patient experiences more pain and difficulty with sitting, lifting and standing. Travel and social life are more difficult and they may be disabled from work. Personal care, sexual activity and sleeping are not grossly affected, and the patient can usually be managed by conservative means  41-60% Severe Disability Pain remains the main problem in this group, but activities of daily living are affected. These patients require a detailed investigation  61-80% Crippled Back pain impinges on all aspects of the patients life. Positive intervention is required  81-100% Bed-bound These patients are either bed-bound or exaggerating their symptoms  Bluford FORBES Zoe DELENA Karon DELENA, et al. Surgery versus conservative management of stable thoracolumbar fracture: the PRESTO feasibility RCT. [place] ([place]): [employer]; 2021 Nov. [facility] Technology Assessment, No. 25.62.) Appendix 3, Oswestry Disability Index category descriptors. Available from:  Findjewelers.cz  Minimally Clinically Important Difference (MCID) = 12.8%  COGNITION: Overall cognitive status: Within functional limits for tasks assessed     SENSATION: WFL; but reports numb sensation  down right leg  MUSCLE LENGTH: Hamstrings: check next visit  POSTURE: rounded shoulders, forward head, and increased lumbar lordosis  PALPATION: Tender L2-L5 paraspinals  LUMBAR ROM:   AROM eval  Flexion Full fingers to toes  Extension 40% available pain  Right lateral flexion  Left lateral flexion   Right rotation   Left rotation    (Blank rows = not tested)  LOWER EXTREMITY ROM:     Active  Right eval Left eval  Hip flexion    Hip extension    Hip abduction    Hip adduction    Hip internal rotation    Hip external rotation    Knee flexion    Knee extension    Ankle dorsiflexion    Ankle plantarflexion    Ankle inversion    Ankle eversion     (Blank rows = not tested)  LOWER EXTREMITY MMT:    MMT Right eval Left eval Right 11/12/24 Left 11/12/24  Hip flexion 4 4+ 4+ 5  Hip extension 3+ 4 4+ 5  Hip abduction      Hip adduction      Hip internal rotation      Hip external rotation      Knee flexion 4 4 5 5   Knee extension 4 5 5 5   Ankle dorsiflexion 4+ 5 5 5   Ankle plantarflexion      Ankle inversion      Ankle eversion       (Blank rows = not tested)  FUNCTIONAL TESTS:     11/12/24: 5 times sit to stand: 20.28 sec hands on thighs              Eval: 5 times sit to stand: 23.73 sec hands on thighs    GAIT: Distance walked: 60 ft Assistive device utilized: None Level of assistance: Modified independence Comments: slight antalgic gait  TREATMENT DATE:  11/18/24: Sit to stand to heel raise 15x 5 no HHA Marching standing on foam 15x 5 minimal to no HHA, min cueing for posture to increase core engagement Paloff NBOS on foam 2x 10 with cueing for core Monster walk 2RT GTB around thigh Sidestep GTB around  thigh 2RT  Leg press 5Pl-->6Pl 2x 10                                     11/12/24 EXERCISE LOG  Exercise Repetitions and Resistance Comments  Goal review  All goals met with exception of sit to stand time improvement.  Review and update HEP  Pt reported exercises were feeling too easy, added exercises and sent home with a green theraband.  Banded glute bridge 15 reps with green theraband   Banded hip extension 2 x 10 reps each leg Moderate cueing for core activation and posture.  Standing marches 15 reps each side Moderate cueing for core activation and eccentric control   Blank cell = exercise not performed today                                    11/08/24 EXERCISE LOG  Exercise Repetitions and Resistance Comments  Bridge  15 reps w/ 5 second hold    SLR  15 reps each    Double knee to chest  2 minutes    Resisted pull down   GTB x 25 reps    Pallof press with overhead reach  GTB x 20 reps each   Standing ball roll out   2 minutes  Multidirectional   Sit to stand  20 reps  With tidal tank at chest   Tandem stance on foam   3  x 30 seconds each    Standing heel raise  20 reps    Standing hip extension  15 reps each     Blank cell = exercise not performed today   11/05/24:  Prone position with MHP - POE x 2 min - Press up 5x 10 - Heel squeeze 10x 5 -glut squeeze 10  - Hip extension 10 Quadriped:  cat/horse x 5  Supine:            -knee to chest 30 x 3    - Bridge 10x 5   - Hamstring stretch 2x 30 With nerve flossing  Sitting- Piriformis stretch  2x 30             Sit to stand x10  Standing back extension x 10                 Heel raises x 10                Squat x 10                      PATIENT EDUCATION:  Education details: Patient educated on exam findings, POC, scope of PT, HEP, and what to expect next visit. Person educated: Patient Education method: Explanation, Demonstration, and Handouts Education comprehension: verbalized understanding, returned  demonstration, verbal cues required, and tactile cues required  HOME EXERCISE PROGRAM: 11/18/24: - Side Stepping with Resistance at Thighs and Counter Support  - 1 x daily - 7 x weekly - 3 sets - 10 reps - Standing 3-Way Kick  - 1 x daily - 7 x weekly - 3 sets - 3 reps - 5 hold  11/12/24: Access Code: [email or web address] URL: [email or web address]  Date: 11/12/2024  Prepared by: Lacinda Fass  Exercises -  Supine Bridge with Resistance Band  - 1-2 x daily - 7 x weekly - 3 sets - 10 reps  - Standing Hip Extension with Resistance at Ankles and Counter Support  - 1-2 x daily - 7 x weekly - 3 sets - 10 reps  - Standing Marching  - 1-2 x daily - 7 x weekly - 3 sets - 10 reps   11/03/24: Access Code: [email or web address] URL: [email or web address] Date: 11/03/2024 Prepared by: Augustin Mclean  Exercises - Prone Press Up On Elbows  - 2 x daily - 7 x weekly - 1 sets - 1 reps - 2' hold - Prone Press Up  - 1 x daily - 7 x weekly - 1 sets - 5 reps - 10 hold - Supine Bridge  - 2 x daily - 7 x weekly - 2 sets - 10 reps - 5 hold - Supine Figure 4 Piriformis Stretch  - 2 x daily - 7 x weekly - 2 sets - 3 reps - 30 hold  Access Code: [email or web address] Date: 10/29/2024 Exercises - Prone Gluteal Sets  - 1 x daily - 7 x weekly - 1 sets - 10 reps - 5 sec hold - Prone Knee Flexion  - 1 x daily - 7 x weekly - 2 sets - 10 reps  Access Code: [email or web address] URL: [email or web address] Date: 10/13/2024 Prepared by: [facility]  Exercises - Lying Prone  - 1 x daily - 7 x weekly - 1 sets - 10 reps - Standing Lumbar Extension with Counter  - 1 x daily - 7 x weekly - 1 sets - 10 reps  ASSESSMENT:  CLINICAL IMPRESSION: Session focus on gluteal and core strengthening as well as  balance stability activities.  Added several new activities that was tolerated well.  Added theraband resistance with gait associated activities for gluteal strengthening and began machines on Cybex.  Discussed subjective  progress and pt feels ready for discharge next session.  Educated importance of continuing exercises following DC.  Pt liked the leg press machine.  Educated benefits of joining gym following therapy, pt is interested.  EOS no reports of pain, stated he feels good.    Eval:Patient is a 49 y.o. male who was seen today for physical therapy evaluation and treatment for  M54.16 (ICD-10-CM) - Lumbar radiculopathy  M47.816 (ICD-10-CM) - Lumbar spondylosis  M51.362 (ICD-10-CM) - Degeneration of intervertebral disc of lumbar region with discogenic back pain and lower extremity pain  Patient demonstrates muscle weakness, reduced ROM, and fascial restrictions which are likely contributing to symptoms of pain and are negatively impacting patient ability to perform ADLs and functional mobility tasks. Patient will benefit from skilled physical therapy services to address these deficits to reduce pain and improve level of function with ADLs and functional mobility tasks.   OBJECTIVE IMPAIRMENTS: Abnormal gait, decreased activity tolerance, difficulty walking, decreased strength, and pain.   ACTIVITY LIMITATIONS: carrying, lifting, bending, sitting, standing, squatting, sleeping, stairs, transfers, bed mobility, and locomotion level  PARTICIPATION LIMITATIONS: meal prep, cleaning, laundry, driving, shopping, and community activity  EHAB POTENTIAL: Good  CLINICAL DECISION MAKING: Evolving/moderate complexity  EVALUATION COMPLEXITY: Moderate   GOALS: Goals reviewed with patient? No  SHORT TERM GOALS: Target date: 10/27/2024  patient will be independent with initial HEP and compliant with HEP 3-4 times a week   Baseline: Everyday Goal status: MET  2.  Patient will report 50% improvement overall  Baseline: 70% Goal status: MET  LONG TERM GOALS: Target date: 11/10/2024  Patient will be independent in self management strategies to improve quality of life and functional outcomes.  Baseline:   Goal status: MET  2.  Patient will report 70% improvement overall  Baseline: 70% (11/12/24) Goal status: MET  3.  Patient will improve 5 times sit to stand score to 15 sec or less to demonstrate improved functional mobility and increased leg strength.    Baseline: 23.73 sec 20.28 (11/12/24) Goal status: IN PROGRESS  4.   Patient will increase right leg MMT's to 4+ to 5/5 to allow navigation of steps without gait deviation or loss of balance  Baseline: see above Goal status: MET  5.  Patient will improve Modified Oswestry score by 8 points to demonstrate improved perceived function  Baseline: 28/50 15/50 (11/12/24) Goal status: MET  PLAN:  PT FREQUENCY: 2x/week  PT DURATION: 4 weeks  PLANNED INTERVENTIONS: 97164- PT Re-evaluation, 97110-Therapeutic exercises, 97530- Therapeutic activity, 97112- Neuromuscular re-education, 97535- Self Care, [id number]- Manual therapy, [id number]- Gait training, [phone]- Orthotic Fit/training, [phone]- Canalith repositioning, [id number]- Aquatic Therapy, 97760- Splinting, [phone]- Wound care (first 20 sq cm), 97598- Wound care (each additional 20 sq cm)Patient/Family education, Balance training, Stair training, Taping, Dry Needling, Joint mobilization, Joint manipulation, Spinal manipulation, Spinal mobilization, Scar mobilization, and DME instructions. SABRA  PLAN FOR NEXT SESSION: Continue 1 more session then DC to HEP.  Show pt different machines including hamstring and Nustep/bike/treadmill and discuss continuing at gym following therapy.  Augustin Mclean, LPTA/CLT; CBIS [phone]  9:43 AM, 11/18/24    "

## 2024-11-18 NOTE — Addendum Note (Signed)
 Addended by: ELSPETH LACINDA BROCKS on: 11/18/2024 02:14 PM   Modules accepted: Orders

## 2024-11-23 ENCOUNTER — Ambulatory Visit (HOSPITAL_COMMUNITY)

## 2024-11-25 ENCOUNTER — Ambulatory Visit (HOSPITAL_COMMUNITY)

## 2024-11-25 DIAGNOSIS — M545 Low back pain, unspecified: Secondary | ICD-10-CM

## 2024-11-25 DIAGNOSIS — R29898 Other symptoms and signs involving the musculoskeletal system: Secondary | ICD-10-CM

## 2024-11-25 DIAGNOSIS — M6281 Muscle weakness (generalized): Secondary | ICD-10-CM

## 2024-11-25 DIAGNOSIS — M5416 Radiculopathy, lumbar region: Secondary | ICD-10-CM

## 2024-11-25 NOTE — Therapy (Signed)
 " OUTPATIENT PHYSICAL THERAPY THORACOLUMBAR TREATMENT/PROGRESS NOTE Progress Note Reporting Period 10/13/2024 to 11/25/2024  See note below for Objective Data and Assessment of Progress/Goals.   PHYSICAL THERAPY DISCHARGE SUMMARY  Visits from Start of Care: 8  Current functional level related to goals / functional outcomes: See below   Remaining deficits: See below   Education / Equipment: HEP   Patient agrees to discharge. Patient goals were partially met. Patient is being discharged due to being pleased with the current functional level.;        Patient Name: Randall Meadows MRN: [id number] DOB:01/31/1976, 49 y.o., male Today's Date: 11/25/2024  END OF SESSION:  PT End of Session - 11/25/24 0904     Visit Number 8    Number of Visits 8    Date for Recertification  12/10/24    Authorization Type [employer]UE [employer] MEDICARE HEALTHY BLUE MEDICARE    Authorization Time Period no auth needed    PT Start Time 0904    PT Stop Time 0944    PT Time Calculation (min) 40 min    Activity Tolerance Patient tolerated treatment well    Behavior During Therapy [name] for tasks assessed/performed            Past Medical History:  Diagnosis Date   Allergy    Anxiety    Arthritis    Asthma    Congenital hydrocephaly (HCC)    Depression    Hypertension    Memory impairment    Sciatica    Past Surgical History:  Procedure Laterality Date   BRAIN SURGERY     Shunt from Hydrocephaly   TONSILLECTOMY     Patient Active Problem List   Diagnosis Date Noted   Current severe episode of major depressive disorder without psychotic features (HCC) 09/27/2024   Prediabetes 09/27/2024   Vitamin D  deficiency 09/27/2024   Mixed hyperlipidemia 09/27/2024   Mild persistent asthma, uncomplicated 10/04/2022   Seasonal allergic rhinitis due to pollen 10/04/2022   Seasonal allergic conjunctivitis 10/04/2022   Anxiety state 04/03/2009   OBESITY 09/07/2007   Essential  hypertension 09/07/2007   Allergic rhinitis 09/07/2007   Headache 09/07/2007   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/2007    PCP: Randall Philippe SAUNDERS, NP  REFERRING PROVIDER: Hilma Hastings, PA-C  REFERRING DIAG:  M54.16 (ICD-10-CM) - Lumbar radiculopathy  M47.816 (ICD-10-CM) - Lumbar spondylosis  M51.362 (ICD-10-CM) - Degeneration of intervertebral disc of lumbar region with discogenic back pain and lower extremity pain    Rationale for Evaluation and Treatment: Rehabilitation  THERAPY DIAG:  Low back pain, unspecified back pain laterality, unspecified chronicity, unspecified whether sciatica present  Radiculopathy, lumbar region  Muscle weakness (generalized)  Other symptoms and signs involving the musculoskeletal system  ONSET DATE: chronic  SUBJECTIVE:  SUBJECTIVE STATEMENT: 90 to 95% better overall; considering joining the [facility]  EVAL:Patient well known to this clinic.  Low back pain for years but seems to be worse lately.  Pain radiates down into right leg and sometimes the left.  New x-ray and MRI showed disc bulge and arthritis.  Has a hard time with prolonged sitting, standing, walking and driving.  Scheduled for an injection next month  PERTINENT HISTORY:    PAIN:  Are you having pain? Yes: NPRS scale: 5/10 Pain location: low back and down legs Pain description: aching and burning and numbness Aggravating factors: prolonged position Relieving factors: heat and back aid  PRECAUTIONS: None  RED FLAGS: None   WEIGHT BEARING RESTRICTIONS: No  FALLS:  Has patient fallen in last 6 months? No   OCCUPATION: disability  PLOF: Independent  PATIENT GOALS: get back to 100%  NEXT MD VISIT: next month consultation about injection  OBJECTIVE:  Note: Objective  measures were completed at Evaluation unless otherwise noted.  DIAGNOSTIC FINDINGS:  IMPRESSION: 1. Small right foraminal disc protrusion at L4-5, potentially affecting the right L4 nerve root. 2. Left foraminal disc protrusion at L3-4, contacting and potentially affecting the exiting left L3 nerve root. 3. Mild to moderate bilateral L3 through L5 foraminal stenosis related disc bulge and facet hypertrophy. 4. Mild reactive marrow edema about the left L4-5 facet due to facet arthritis. Finding could serve as a source for lower back pain.     Electronically Signed   By: Randall Meadows M.D.   On: 08/30/2024 05:27  PATIENT SURVEYS:  11/12/24: 15/50; 30%  Modified Oswestry:  MODIFIED OSWESTRY DISABILITY SCALE  Date: 10/13/2024 Score                                Total 28/50; 56%   Interpretation of scores: Score Category Description  0-20% Minimal Disability The patient can cope with most living activities. Usually no treatment is indicated apart from advice on lifting, sitting and exercise  21-40% Moderate Disability The patient experiences more pain and difficulty with sitting, lifting and standing. Travel and social life are more difficult and they may be disabled from work. Personal care, sexual activity and sleeping are not grossly affected, and the patient can usually be managed by conservative means  41-60% Severe Disability Pain remains the main problem in this group, but activities of daily living are affected. These patients require a detailed investigation  61-80% Crippled Back pain impinges on all aspects of the patients life. Positive intervention is required  81-100% Bed-bound These patients are either bed-bound or exaggerating their symptoms  Randall Meadows, et al. Surgery versus conservative management of stable thoracolumbar fracture: the PRESTO feasibility RCT. [place] ([place]): [employer]; 2021 Nov. [facility] [employer]  Assessment, No. 25.62.) Appendix 3, Oswestry Disability Index category descriptors. Available from: Findjewelers.cz  Minimally Clinically Important Difference (MCID) = 12.8%  COGNITION: Overall cognitive status: Within functional limits for tasks assessed     SENSATION: WFL; but reports numb sensation  down right leg  MUSCLE LENGTH: Hamstrings: check next visit  POSTURE: rounded shoulders, forward head, and increased lumbar lordosis  PALPATION: Tender L2-L5 paraspinals  LUMBAR ROM:   AROM eval  Flexion Full fingers to toes  Extension 40% available pain  Right lateral flexion   Left lateral flexion   Right rotation   Left rotation    (Blank rows = not tested)  LOWER  EXTREMITY ROM:     Active  Right eval Left eval  Hip flexion    Hip extension    Hip abduction    Hip adduction    Hip internal rotation    Hip external rotation    Knee flexion    Knee extension    Ankle dorsiflexion    Ankle plantarflexion    Ankle inversion    Ankle eversion     (Blank rows = not tested)  LOWER EXTREMITY MMT:    MMT Right eval Left eval Right 11/12/24 Left 11/12/24  Hip flexion 4 4+ 4+ 5  Hip extension 3+ 4 4+ 5  Hip abduction      Hip adduction      Hip internal rotation      Hip external rotation      Knee flexion 4 4 5 5   Knee extension 4 5 5 5   Ankle dorsiflexion 4+ 5 5 5   Ankle plantarflexion      Ankle inversion      Ankle eversion       (Blank rows = not tested)  FUNCTIONAL TESTS:     11/12/24: 5 times sit to stand: 20.28 sec hands on thighs              Eval: 5 times sit to stand: 23.73 sec hands on thighs    GAIT: Distance walked: 60 ft Assistive device utilized: None Level of assistance: Modified independence Comments: slight antalgic gait  TREATMENT DATE:  11/25/24 Progress note Prone lying with moist heat to low back x 5' Review of goals and progress; discussion of joining [employer] Prone on elbows PPU's x 10 Glute  sets 5 hold x 10 Hamstring curls x 10 each Nustep seat 11 x 5' level 3 Cybex Hamstring 4 plates 2 x 10 Body craft lat pulls 4 plates 2 x 10 Leg press 6 plates 2 x 10    11/18/24: Sit to stand to heel raise 15x 5 no HHA Marching standing on foam 15x 5 minimal to no HHA, min cueing for posture to increase core engagement Paloff NBOS on foam 2x 10 with cueing for core Monster walk 2RT GTB around thigh Sidestep GTB around thigh 2RT  Leg press 5Pl-->6Pl 2x 10                                     11/12/24 EXERCISE LOG  Exercise Repetitions and Resistance Comments  Goal review  All goals met with exception of sit to stand time improvement.  Review and update HEP  Pt reported exercises were feeling too easy, added exercises and sent home with a green theraband.  Banded glute bridge 15 reps with green theraband   Banded hip extension 2 x 10 reps each leg Moderate cueing for core activation and posture.  Standing marches 15 reps each side Moderate cueing for core activation and eccentric control   Blank cell = exercise not performed today                                    11/08/24 EXERCISE LOG  Exercise Repetitions and Resistance Comments  Bridge  15 reps w/ 5 second hold    SLR  15 reps each    Double knee to chest  2 minutes    Resisted pull down   GTB x  25 reps    Pallof press with overhead reach  GTB x 20 reps each   Standing ball roll out   2 minutes  Multidirectional   Sit to stand  20 reps  With tidal tank at chest   Tandem stance on foam   3 x 30 seconds each    Standing heel raise  20 reps    Standing hip extension  15 reps each     Blank cell = exercise not performed today   11/05/24:  Prone position with MHP - POE x 2 min - Press up 5x 10 - Heel squeeze 10x 5 -glut squeeze 10  - Hip extension 10 Quadriped:  cat/horse x 5  Supine:            -knee to chest 30 x 3    - Bridge 10x 5   - Hamstring stretch 2x 30 With nerve flossing  Sitting- Piriformis stretch   2x 30             Sit to stand x10  Standing back extension x 10                 Heel raises x 10                Squat x 10                      PATIENT EDUCATION:  Education details: Patient educated on exam findings, POC, scope of PT, HEP, and what to expect next visit. Person educated: Patient Education method: Explanation, Demonstration, and Handouts Education comprehension: verbalized understanding, returned demonstration, verbal cues required, and tactile cues required  HOME EXERCISE PROGRAM: 11/18/24: - Side Stepping with Resistance at Thighs and Counter Support  - 1 x daily - 7 x weekly - 3 sets - 10 reps - Standing 3-Way Kick  - 1 x daily - 7 x weekly - 3 sets - 3 reps - 5 hold  11/12/24: Access Code: [email or web address] URL: [email or web address]  Date: 11/12/2024  Prepared by: Lacinda Fass  Exercises -  Supine Bridge with Resistance Band  - 1-2 x daily - 7 x weekly - 3 sets - 10 reps  - Standing Hip Extension with Resistance at Ankles and Counter Support  - 1-2 x daily - 7 x weekly - 3 sets - 10 reps  - Standing Marching  - 1-2 x daily - 7 x weekly - 3 sets - 10 reps   11/03/24: Access Code: [email or web address] URL: [email or web address] Date: 11/03/2024 Prepared by: Augustin Mclean  Exercises - Prone Press Up On Elbows  - 2 x daily - 7 x weekly - 1 sets - 1 reps - 2' hold - Prone Press Up  - 1 x daily - 7 x weekly - 1 sets - 5 reps - 10 hold - Supine Bridge  - 2 x daily - 7 x weekly - 2 sets - 10 reps - 5 hold - Supine Figure 4 Piriformis Stretch  - 2 x daily - 7 x weekly - 2 sets - 3 reps - 30 hold  Access Code: [email or web address] Date: 10/29/2024 Exercises - Prone Gluteal Sets  - 1 x daily - 7 x weekly - 1 sets - 10 reps - 5 sec hold - Prone Knee Flexion  - 1 x daily - 7 x weekly - 2 sets - 10 reps  Access Code: [email or web address] URL: [email or web address] Date: 10/13/2024 Prepared by: [facility] - Rehab  Exercises - Lying Prone  -  1 x daily - 7 x  weekly - 1 sets - 10 reps - Standing Lumbar Extension with Counter  - 1 x daily - 7 x weekly - 1 sets - 10 reps  ASSESSMENT:  CLINICAL IMPRESSION: Progress note today.  Verbalizes 90% better.  Has met all but one set rehab goal.  Patient is agreeable to discharge at this time.    Eval:Patient is a 49 y.o. male who was seen today for physical therapy evaluation and treatment for  M54.16 (ICD-10-CM) - Lumbar radiculopathy  M47.816 (ICD-10-CM) - Lumbar spondylosis  M51.362 (ICD-10-CM) - Degeneration of intervertebral disc of lumbar region with discogenic back pain and lower extremity pain  Patient demonstrates muscle weakness, reduced ROM, and fascial restrictions which are likely contributing to symptoms of pain and are negatively impacting patient ability to perform ADLs and functional mobility tasks. Patient will benefit from skilled physical therapy services to address these deficits to reduce pain and improve level of function with ADLs and functional mobility tasks.   OBJECTIVE IMPAIRMENTS: Abnormal gait, decreased activity tolerance, difficulty walking, decreased strength, and pain.   ACTIVITY LIMITATIONS: carrying, lifting, bending, sitting, standing, squatting, sleeping, stairs, transfers, bed mobility, and locomotion level  PARTICIPATION LIMITATIONS: meal prep, cleaning, laundry, driving, shopping, and community activity  EHAB POTENTIAL: Good  CLINICAL DECISION MAKING: Evolving/moderate complexity  EVALUATION COMPLEXITY: Moderate   GOALS: Goals reviewed with patient? No  SHORT TERM GOALS: Target date: 10/27/2024  patient will be independent with initial HEP and compliant with HEP 3-4 times a week   Baseline: Everyday Goal status: MET  2.  Patient will report 50% improvement overall  Baseline: 70% Goal status: MET  LONG TERM GOALS: Target date: 11/10/2024  Patient will be independent in self management strategies to improve quality of life and functional  outcomes.  Baseline:  Goal status: MET  2.  Patient will report 70% improvement overall  Baseline: 70% (11/12/24) Goal status: MET  3.  Patient will improve 5 times sit to stand score to 15 sec or less to demonstrate improved functional mobility and increased leg strength.    Baseline: 23.73 sec 20.28 (11/12/24) Goal status: IN PROGRESS  4.   Patient will increase right leg MMT's to 4+ to 5/5 to allow navigation of steps without gait deviation or loss of balance  Baseline: see above Goal status: MET  5.  Patient will improve Modified Oswestry score by 8 points to demonstrate improved perceived function  Baseline: 28/50 15/50 (11/12/24) Goal status: MET  PLAN:  PT FREQUENCY: 2x/week  PT DURATION: 4 weeks  PLANNED INTERVENTIONS: 97164- PT Re-evaluation, 97110-Therapeutic exercises, 97530- Therapeutic activity, 97112- Neuromuscular re-education, 97535- Self Care, 02859- Manual therapy, [id number]- Gait training, [phone]- Orthotic Fit/training, [phone]- Canalith repositioning, [id number]- Aquatic Therapy, 97760- Splinting, [phone]- Wound care (first 20 sq cm), 97598- Wound care (each additional 20 sq cm)Patient/Family education, Balance training, Stair training, Taping, Dry Needling, Joint mobilization, Joint manipulation, Spinal manipulation, Spinal mobilization, Scar mobilization, and DME instructions. SABRA  PLAN FOR NEXT SESSION: discharge  9:44 AM, 11/25/24 Shallyn Constancio Small Davona Kinoshita MPT [facility] [place] [phone] Ph:[phone]    "

## 2024-11-26 NOTE — Progress Notes (Unsigned)
 "  Referring Physician:  Billy Philippe SAUNDERS, NP [address] [place],  [place] [address]  Primary Physician:  Billy Philippe SAUNDERS, NP  History of Present Illness: Randall Meadows has a history of HTN, asthma, obesity, anxiety.  He had phone visit with me on 09/09/24 and was doing a little better. He has known lumbar spondylosis and DDD with mild bilateral foraminal stenosis L3-L4 and L5-S1 along with moderate bilateral foraminal stenosis L4-L5.   He was sent to PT- had initial evaluation on 10/13/24 with 7 more visits through 11/26/24.  He was also sent to PMR- saw Dr. Dodson on 11/02/24. He did not want to proceed with injection. She increased his neurontin  200mg  to tid. She discussed injection under sedation if needed.   He is here for follow up.       He has seen Dr. Ozell Ada (ortho spine) and CT myelogram was ordered. Patient cancelled it as he was concerned about possible complications.   He has a shunt was told that he cannot have MRI.   10 month history of constant LBP with bilateral buttock pain and intermittent posterior left leg pain to his knee and constant pain from knees to feet bilaterally (entire leg). LBP > leg pain, left leg pain > right leg pain. He has numbness and tingling in left leg and foot. He has weakness in both legs. Pain is worse with prolonged sitting/walking and driving. Some relief with heat.   He is on oxycodone  and gabapentin .      Tobacco use: Does not smoke.   Bowel/Bladder Dysfunction: none  Conservative measures:  Physical therapy: initial evaluation on 10/13/24 with 7 more visits through 11/26/24 Multimodal medical therapy including regular antiinflammatories:  Hydrocodone, Oxycodone , Gabapentin , Ibuprofen, Tramadol, Flexeril Injections:  no epidural steroid injections  Past Surgery: no spine surgery  Randall Meadows has no symptoms of cervical myelopathy.  The symptoms are causing a significant impact on the  patient's life.   Review of Systems:  A 10 point review of systems is negative, except for the pertinent positives and negatives detailed in the HPI.  Past Medical History: Past Medical History:  Diagnosis Date   Allergy    Anxiety    Arthritis    Asthma    Congenital hydrocephaly (HCC)    Depression    Hypertension    Memory impairment    Sciatica     Past Surgical History: Past Surgical History:  Procedure Laterality Date   BRAIN SURGERY     Shunt from Hydrocephaly   TONSILLECTOMY      Allergies: Allergies as of 12/01/2024 - Review Complete 11/18/2024  Allergen Reaction Noted   Grass pollen([id number]-o-[id number]-swt vern) Itching 07/03/2023    Medications: Outpatient Encounter Medications as of 12/01/2024  Medication Sig   albuterol  (VENTOLIN  HFA) 108 (90 Base) MCG/ACT inhaler Inhale 1-2 puffs into the lungs every 4 (four) hours as needed for wheezing or shortness of breath (cough, shortness of breath or wheezing.).   benzonatate  (TESSALON ) 100 MG capsule Take 1 capsule (100 mg total) by mouth every 8 (eight) hours.   budesonide -formoterol  (SYMBICORT ) 160-4.5 MCG/ACT inhaler Inhale 2 puffs into the lungs daily.   cetirizine  (ZYRTEC ) 10 MG tablet Take 1 tablet (10 mg total) by mouth daily as needed for allergies.   Cholecalciferol (VITAMIN D3) 1.25 MG (50000 UT) CAPS Take 1 capsule (1.25 mg total) by mouth every 7 (seven) days.   donepezil (ARICEPT) 5 MG tablet Take 5 mg by mouth daily.  escitalopram  (LEXAPRO ) 10 MG tablet Take 1 tablet (10 mg total) by mouth daily.   fluticasone  (FLONASE ) 50 MCG/ACT nasal spray Place 2 sprays into both nostrils daily as needed.   gabapentin  (NEURONTIN ) 100 MG capsule TAKE 1 CAPSULE BY MOUTH THREE TIMES DAILY (Patient taking differently: Take 200 mg by mouth 3 (three) times daily.)   hydrOXYzine  (ATARAX ) 50 MG tablet Take 1 tablet (50 mg total) by mouth at bedtime.   lisinopril-hydrochlorothiazide (ZESTORETIC) 20-25 MG tablet Take 1 tablet by mouth  daily.   metoprolol  succinate (TOPROL  XL) 25 MG 24 hr tablet Take 1 tablet (25 mg total) by mouth daily.   potassium chloride  (MICRO-K ) 10 MEQ CR capsule Take 1 capsule (10 mEq total) by mouth daily.   rosuvastatin  (CRESTOR ) 5 MG tablet Take 1 tablet (5 mg total) by mouth daily.   triamcinolone  cream (KENALOG ) 0.1 % Apply a thin layer to the affected areas twice daily. DO NOT use on the face.   vitamin B-12 (CYANOCOBALAMIN ) 100 MCG tablet Take 100 mcg by mouth daily.   No facility-administered encounter medications on file as of 12/01/2024.    Social History: Social History   Tobacco Use   Smoking status: Never   Smokeless tobacco: Never  Vaping Use   Vaping status: Never Used  Substance Use Topics   Alcohol use: Never   Drug use: Never    Family Medical History: Family History  Problem Relation Age of Onset   Hypertension Mother    Heart disease Mother    Diabetes Mother    Stroke Mother    Depression Son    Anxiety disorder Son    Allergic rhinitis Son    ADD / ADHD Son    Asthma Son    Hypertension Son    Intellectual disability Son    Depression Son    Anxiety disorder Son    Allergic rhinitis Son    ADD / ADHD Son    Hypertension Son    Bipolar disorder Maternal Aunt    Schizophrenia Maternal Aunt    Anxiety disorder Maternal Aunt    Depression Maternal Aunt    Urticaria Maternal Grandmother    Food Allergy Maternal Grandmother    Heart disease Maternal Grandmother    Arthritis Maternal Grandmother    Depression Maternal Grandmother    Hypertension Maternal Grandmother    Hypertension Maternal Grandfather    Cancer Paternal Grandfather    Arthritis Paternal Grandmother    Heart disease Paternal Grandmother    Hypertension Paternal Grandmother     Physical Examination: There were no vitals filed for this visit.    Awake, alert, oriented to person, place, and time.  Speech is clear and fluent. Fund of knowledge is appropriate.   Cranial Nerves: Pupils  equal round and reactive to light.  Facial tone is symmetric.    No posterior lumbar tenderness.   No abnormal lesions on exposed skin.   Strength: Side Biceps Triceps Deltoid Interossei Grip Wrist Ext. Wrist Flex.  R 5 5 5 5 5 5 5   L 5 5 5 5 5 5 5    Side Iliopsoas Quads Hamstring PF DF EHL  R 5 5 5 5 5 5   L 5 5 5 5 5 5    Reflexes are 2+ and symmetric at the biceps, brachioradialis, patella and achilles.   Hoffman's is absent.  Clonus is not present.   Bilateral upper and lower extremity sensation is intact to light touch.     No pain  with IR/ER of both hips.   Gait is normal.     Medical Decision Making  Imaging: none  Assessment and Plan: Randall Meadows He is doing a little better since his last visit. He has intermittent LBP with occasional left posterior leg pain. He has seen improvement in bilateral leg pain from knees to his feet. He still has weakness, numbness, and tingling in right leg, left leg weakness has improved. Pain is still worse with prolonged sitting/standing/driving.    He has known lumbar spondylosis and DDD with mild bilateral foraminal stenosis L3-L4 and L5-S1 along with moderate bilateral foraminal stenosis L4-L5.    Discussed MRI results with patient and his wife.    Treatment options discussed with patient and following plan made:    - PT for lumbar spine. Orders to [facility].  - Referral to PMR at [facility] to discuss possible injections.  - Follow up with me in 6-8 weeks to check on his progress with above.   I spent a total of *** minutes in face-to-face and non-face-to-face activities related to this patient's care today including review of outside records, review of imaging, review of symptoms, physical exam, discussion of differential diagnosis, discussion of treatment options, and documentation.   Glade Boys PA-C Dept. of Neurosurgery  "

## 2024-11-29 ENCOUNTER — Telehealth: Payer: Self-pay

## 2024-11-29 ENCOUNTER — Ambulatory Visit (HOSPITAL_BASED_OUTPATIENT_CLINIC_OR_DEPARTMENT_OTHER): Admitting: Nurse Practitioner

## 2024-11-29 ENCOUNTER — Ambulatory Visit: Admitting: Family Medicine

## 2024-11-29 NOTE — Telephone Encounter (Signed)
 Patient called back and rescheduled procedure for 12/30/24 due to inclement weather

## 2024-11-29 NOTE — Telephone Encounter (Signed)
 Patients wife confirmed that patient plans to come for his colonoscopy on 11/30/24.

## 2024-11-30 ENCOUNTER — Encounter: Admitting: Gastroenterology

## 2024-12-01 ENCOUNTER — Ambulatory Visit: Admitting: Family Medicine

## 2024-12-01 ENCOUNTER — Encounter (HOSPITAL_BASED_OUTPATIENT_CLINIC_OR_DEPARTMENT_OTHER): Payer: Self-pay | Admitting: Nurse Practitioner

## 2024-12-01 ENCOUNTER — Ambulatory Visit: Admitting: Orthopedic Surgery

## 2024-12-01 ENCOUNTER — Ambulatory Visit (INDEPENDENT_AMBULATORY_CARE_PROVIDER_SITE_OTHER): Admitting: Nurse Practitioner

## 2024-12-01 VITALS — BP 128/60 | HR 77 | Ht 69.0 in | Wt 268.9 lb

## 2024-12-01 DIAGNOSIS — F411 Generalized anxiety disorder: Secondary | ICD-10-CM

## 2024-12-01 DIAGNOSIS — I10 Essential (primary) hypertension: Secondary | ICD-10-CM | POA: Diagnosis not present

## 2024-12-01 DIAGNOSIS — F332 Major depressive disorder, recurrent severe without psychotic features: Secondary | ICD-10-CM | POA: Diagnosis not present

## 2024-12-01 DIAGNOSIS — E876 Hypokalemia: Secondary | ICD-10-CM | POA: Diagnosis not present

## 2024-12-01 DIAGNOSIS — R079 Chest pain, unspecified: Secondary | ICD-10-CM

## 2024-12-01 DIAGNOSIS — Z7189 Other specified counseling: Secondary | ICD-10-CM

## 2024-12-01 DIAGNOSIS — E785 Hyperlipidemia, unspecified: Secondary | ICD-10-CM | POA: Diagnosis not present

## 2024-12-01 NOTE — Patient Instructions (Signed)
 Medication Instructions:   Your physician recommends that you continue on your current medications as directed. Please refer to the Current Medication list given to you today.   *If you need a refill on your cardiac medications before your next appointment, please call your pharmacy*  Lab Work:  None ordered.  If you have labs (blood work) drawn today and your tests are completely normal, you will receive your results only by: MyChart Message (if you have MyChart) OR A paper copy in the mail If you have any lab test that is abnormal or we need to change your treatment, we will call you to review the results.  Testing/Procedures:  None ordered.  Follow-Up: At [facility], you and your health needs are our priority.  As part of our continuing mission to provide you with exceptional heart care, our providers are all part of one team.  This team includes your primary Cardiologist (physician) and Advanced Practice Providers or APPs (Physician Assistants and Nurse Practitioners) who all work together to provide you with the care you need, when you need it.  Your next appointment:   1 year(s)  Provider:   Sheryle Donning, MD, Slater Duncan, NP, or Neomi Banks, NP    We recommend signing up for the patient portal called "MyChart".  Sign up information is provided on this After Visit Summary.  MyChart is used to connect with patients for Virtual Visits (Telemedicine).  Patients are able to view lab/test results, encounter notes, upcoming appointments, etc.  Non-urgent messages can be sent to your provider as well.   To learn more about what you can do with MyChart, go to [email or web address].   Other Instructions  Your physician wants you to follow-up in: 1 year.  You will receive a reminder letter in the mail two months in advance. If you don't receive a letter, please call our office to schedule the follow-up appointment.

## 2024-12-02 ENCOUNTER — Ambulatory Visit (HOSPITAL_COMMUNITY)

## 2024-12-02 ENCOUNTER — Encounter: Payer: Self-pay | Admitting: Family Medicine

## 2024-12-07 ENCOUNTER — Ambulatory Visit: Admitting: Family Medicine

## 2024-12-14 ENCOUNTER — Ambulatory Visit: Admitting: Orthopedic Surgery

## 2024-12-29 ENCOUNTER — Encounter: Admitting: Gastroenterology

## 2024-12-30 ENCOUNTER — Encounter: Admitting: Gastroenterology

## 2025-01-17 ENCOUNTER — Ambulatory Visit: Admitting: Neurology

## 2025-02-16 ENCOUNTER — Ambulatory Visit: Admitting: Allergy & Immunology

## 2025-03-01 ENCOUNTER — Ambulatory Visit: Admitting: "Endocrinology
